# Patient Record
Sex: Female | Born: 1980 | Race: Black or African American | Hispanic: No | Marital: Single | Smoking: Never smoker
Health system: Southern US, Community
[De-identification: ages and names within clinical notes are randomized; demographics above are authoritative.]

## PROBLEM LIST (undated history)

## (undated) DIAGNOSIS — E282 Polycystic ovarian syndrome: Secondary | ICD-10-CM

## (undated) DIAGNOSIS — N814 Uterovaginal prolapse, unspecified: Secondary | ICD-10-CM

## (undated) DIAGNOSIS — L732 Hidradenitis suppurativa: Secondary | ICD-10-CM

## (undated) DIAGNOSIS — E559 Vitamin D deficiency, unspecified: Secondary | ICD-10-CM

## (undated) DIAGNOSIS — I10 Essential (primary) hypertension: Secondary | ICD-10-CM

## (undated) DIAGNOSIS — F419 Anxiety disorder, unspecified: Secondary | ICD-10-CM

## (undated) DIAGNOSIS — M549 Dorsalgia, unspecified: Secondary | ICD-10-CM

## (undated) DIAGNOSIS — D649 Anemia, unspecified: Secondary | ICD-10-CM

## (undated) DIAGNOSIS — E611 Iron deficiency: Secondary | ICD-10-CM

## (undated) DIAGNOSIS — R079 Chest pain, unspecified: Secondary | ICD-10-CM

## (undated) DIAGNOSIS — M2550 Pain in unspecified joint: Secondary | ICD-10-CM

## (undated) DIAGNOSIS — G4730 Sleep apnea, unspecified: Secondary | ICD-10-CM

## (undated) DIAGNOSIS — Z973 Presence of spectacles and contact lenses: Secondary | ICD-10-CM

## (undated) DIAGNOSIS — E119 Type 2 diabetes mellitus without complications: Secondary | ICD-10-CM

## (undated) DIAGNOSIS — D219 Benign neoplasm of connective and other soft tissue, unspecified: Secondary | ICD-10-CM

## (undated) DIAGNOSIS — M1990 Unspecified osteoarthritis, unspecified site: Secondary | ICD-10-CM

## (undated) HISTORY — DX: Anxiety disorder, unspecified: F41.9

## (undated) HISTORY — DX: Essential (primary) hypertension: I10

## (undated) HISTORY — DX: Hidradenitis suppurativa: L73.2

## (undated) HISTORY — DX: Anemia, unspecified: D64.9

## (undated) HISTORY — DX: Type 2 diabetes mellitus without complications: E11.9

## (undated) HISTORY — DX: Chest pain, unspecified: R07.9

## (undated) HISTORY — DX: Vitamin D deficiency, unspecified: E55.9

## (undated) HISTORY — DX: Iron deficiency: E61.1

## (undated) HISTORY — DX: Benign neoplasm of connective and other soft tissue, unspecified: D21.9

## (undated) HISTORY — PX: BREAST BIOPSY: SHX20

## (undated) HISTORY — DX: Uterovaginal prolapse, unspecified: N81.4

## (undated) HISTORY — DX: Pain in unspecified joint: M25.50

## (undated) HISTORY — DX: Dorsalgia, unspecified: M54.9

## (undated) HISTORY — DX: Polycystic ovarian syndrome: E28.2

---
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
## 2005-02-23 HISTORY — PX: OTHER SURGICAL HISTORY: SHX169

## 2005-04-25 DIAGNOSIS — E119 Type 2 diabetes mellitus without complications: Secondary | ICD-10-CM

## 2005-04-25 HISTORY — DX: Type 2 diabetes mellitus without complications: E11.9

## 2008-04-25 DIAGNOSIS — N814 Uterovaginal prolapse, unspecified: Secondary | ICD-10-CM

## 2008-04-25 DIAGNOSIS — D219 Benign neoplasm of connective and other soft tissue, unspecified: Secondary | ICD-10-CM

## 2008-04-25 HISTORY — DX: Uterovaginal prolapse, unspecified: N81.4

## 2008-04-25 HISTORY — DX: Benign neoplasm of connective and other soft tissue, unspecified: D21.9

## 2008-10-06 DIAGNOSIS — L732 Hidradenitis suppurativa: Secondary | ICD-10-CM | POA: Insufficient documentation

## 2009-04-25 HISTORY — PX: HYSTEROSCOPY: SHX211

## 2009-04-25 HISTORY — PX: DILATION AND CURETTAGE OF UTERUS: SHX78

## 2012-11-12 ENCOUNTER — Encounter: Payer: Self-pay | Admitting: Nurse Practitioner

## 2012-11-12 ENCOUNTER — Ambulatory Visit (INDEPENDENT_AMBULATORY_CARE_PROVIDER_SITE_OTHER): Payer: 59 | Admitting: Nurse Practitioner

## 2012-11-12 VITALS — BP 132/70 | HR 64 | Resp 14 | Ht 65.5 in | Wt 265.4 lb

## 2012-11-12 DIAGNOSIS — E119 Type 2 diabetes mellitus without complications: Secondary | ICD-10-CM

## 2012-11-12 DIAGNOSIS — Z113 Encounter for screening for infections with a predominantly sexual mode of transmission: Secondary | ICD-10-CM

## 2012-11-12 DIAGNOSIS — L732 Hidradenitis suppurativa: Secondary | ICD-10-CM

## 2012-11-12 DIAGNOSIS — Z0000 Encounter for general adult medical examination without abnormal findings: Secondary | ICD-10-CM

## 2012-11-12 DIAGNOSIS — Z01419 Encounter for gynecological examination (general) (routine) without abnormal findings: Secondary | ICD-10-CM

## 2012-11-12 LAB — POCT URINALYSIS DIPSTICK: Urobilinogen, UA: NEGATIVE

## 2012-11-12 LAB — HEMOGLOBIN, FINGERSTICK: Hemoglobin, fingerstick: 12.3 g/dL (ref 12.0–16.0)

## 2012-11-12 MED ORDER — CEPHALEXIN 500 MG PO CAPS: 500.0000 mg | ORAL_CAPSULE | Freq: Two times a day (BID) | ORAL | Status: DC

## 2012-11-12 NOTE — Patient Instructions (Addendum)

## 2012-11-12 NOTE — Progress Notes (Signed)
31 y.o.G0. Single African American Fe here for annual exam.  Menses is regular and now at only 4 days.  When fibroids were present cycle for 7- 10 days and very heavy. Not dating but sexually same partner for 8 years on and off. Nursing Diplomatic Services operational officer at [employer]   Patient's last menstrual period was 10/05/2012.          Sexually active: yes  The current method of family planning is none.    Exercising: yes  zumba and weight lifting.  Smoker:  no  Health Maintenance: Pap:  2012 normal, abnormal pap 2010 with repeat at normal no procedures. MMG:  never TDaP:  2009 Labs: Hgb- 13.2   reports that she has never smoked. She has never used smokeless tobacco. She reports that she does not drink alcohol or use illicit drugs.  Past Medical History  Diagnosis Date  . Prolapsed uterus   . Fibroids   . Hypertension   . Anemia   . Diabetes mellitus without complication     Past Surgical History  Procedure Laterality Date  . Dilation and curettage of uterus      fibroids  . Carbuckle cyst in back removed    . Hysteroscopy      Current Outpatient Prescriptions  Medication Sig Dispense Refill  . ferrous fumarate (HEMOCYTE - 106 MG FE) 325 (106 FE) MG TABS Take 1 tablet by mouth.      Marland Kitchen lisinopril (PRINIVIL,ZESTRIL) 20 MG tablet Take 20 mg by mouth daily.      . Prenatal Vit-Fe Fumarate-FA (PRENATAL MULTIVITAMIN) TABS Take 1 tablet by mouth daily at 12 noon.       No current facility-administered medications for this visit.    Family History  Problem Relation Age of Onset  . Hypertension Mother   . Diabetes Mother   . Hypertension Father   . Stroke Maternal Aunt   . Diabetes Maternal Aunt   . Hypertension Maternal Aunt     ROS:  Pertinent items are noted in HPI.  Otherwise, a comprehensive ROS was negative.  Exam:   BP 132/70  Pulse 64  Resp 14  Ht 5' 5.5" (1.664 m)  Wt 265 lb 6.4 oz (120.385 kg)  BMI 43.48 kg/m2  LMP 10/05/2012 Height: 5' 5.5" (166.4 cm)  Ht Readings from  Last 3 Encounters:  11/12/12 5' 5.5" (1.664 m)    General appearance: alert, cooperative and appears stated age Head: Normocephalic, without obvious abnormality, atraumatic Neck: no adenopathy, supple, symmetrical, trachea midline and thyroid normal to inspection and palpation Lungs: clear to auscultation bilaterally Breasts: normal appearance, no masses or tenderness left axilla with a small area 1 cm of flare of hidradenitis and a much smaller area under left breast about 1/2 cm size. No exudate from either site. Heart: regular rate and rhythm Abdomen: soft, non-tender; no masses,  no organomegaly Extremities: extremities normal, atraumatic, no cyanosis or edema Skin: Skin color, texture, turgor normal. No rashes or lesions Lymph nodes: Cervical, supraclavicular, and axillary nodes normal. No abnormal inguinal nodes palpated Neurologic: Grossly normal   Pelvic: External genitalia:  no lesions              Urethra:  normal appearing urethra with no masses, tenderness or lesions              Bartholin's and Skene's: normal                 Vagina: normal appearing vagina with normal color and discharge,  no lesions              Cervix: anteverted and is prominent with some prolapse.              Pap taken: yes Bimanual Exam:  Uterus:  normal size, contour, position, consistency, mobility, non-tender              Adnexa: no mass, fullness, tenderness               Rectovaginal: Confirms               Anus:  normal sphincter tone, no lesions  A:  Well Woman with normal exam  Condoms for contraception  History of uterine fibroids and menorrhagia with anemia - Hysteroscopic removal 2011  Hidradenitis of the axilla wit current  flare  R/O STD's  History of Diabetes - now diet controlled  History of Hypertension  Strong FMH of Diabetes  P:   Pap smear as per guidelines   Keflex 500 mg BID for next 5-7 days then hold rest for prn use # 30/ 0  Will make referral to Dr. Talmage Nap to  establish care since endocrinologist is in [place], [place]  OTC Phisoderm 2-3 times a week to decrease incidence of hidradenitis  counseled on breast self exam, STD prevention, adequate intake of calcium and vitamin D, diet and exercise.  If heavier menses to call back. return annually or prn  An After Visit Summary was printed and given to the patient.

## 2012-11-14 LAB — IPS PAP TEST WITH HPV

## 2012-11-14 LAB — IPS N GONORRHOEA AND CHLAMYDIA BY PCR

## 2012-11-14 NOTE — Progress Notes (Signed)
Encounter reviewed by Dr. Tifanie Gardiner Silva.  

## 2012-11-19 ENCOUNTER — Telehealth: Payer: Self-pay | Admitting: *Deleted

## 2012-11-19 NOTE — Telephone Encounter (Signed)
LVM for pt to return my call in regards to std results/needing new pap.

## 2012-11-19 NOTE — Telephone Encounter (Signed)
Returning call.

## 2012-11-19 NOTE — Telephone Encounter (Signed)
Message copied by Osie Bond on Mon Nov 19, 2012  2:00 PM ------      Message from: Ria Comment R      Created: Thu Nov 15, 2012  8:40 AM       Ask patient to return for repeat pap she had too few cells to test and I want to make sure pap is OK.  Her GC/ Chl and other STD's was negative. ------

## 2012-11-26 ENCOUNTER — Ambulatory Visit (INDEPENDENT_AMBULATORY_CARE_PROVIDER_SITE_OTHER): Payer: 59 | Admitting: Nurse Practitioner

## 2012-11-26 ENCOUNTER — Encounter: Payer: Self-pay | Admitting: Nurse Practitioner

## 2012-11-26 VITALS — BP 140/78 | HR 60 | Resp 25 | Ht 63.75 in | Wt 274.4 lb

## 2012-11-26 DIAGNOSIS — Z0000 Encounter for general adult medical examination without abnormal findings: Secondary | ICD-10-CM

## 2012-11-26 NOTE — Progress Notes (Signed)
Subjective:     Patient ID: Cathy Ho, female   DOB: [birth year], 32 y.o.   MRN: [id number]  HPI 32 yo SAA Fe presents to have a repeat pap.  Last pap at [facility] 7/21 revealed too few cells to get adequate pap.  Her GC / Chl was done and reported as negative bu patient wants this repeated as well.  She is concerned that test may have insufficient cells. No changes in history or problems since last here.   Review of Systems  Constitutional: Negative.   Respiratory: Negative.   Cardiovascular: Negative.   Gastrointestinal: Negative.   Genitourinary: Negative.   Musculoskeletal: Negative.   Skin: Negative.   Psychiatric/Behavioral: Negative.        Objective:   Physical Exam  Constitutional: She appears well-developed and well-nourished.  Genitourinary:  Cervix easily visualized and pap again collected along with GC & CHL.  No vaginal discharge.       Assessment:     Repeat pap secondary to insufficient cells Per patients request will also repeat some of the STD's    Plan:     Call patient with test results RTO in 1 year.

## 2012-11-26 NOTE — Patient Instructions (Signed)
Will call you with results

## 2012-11-27 LAB — IPS N GONORRHOEA AND CHLAMYDIA BY PCR

## 2012-11-28 NOTE — Progress Notes (Signed)
Encounter reviewed by Dr. Brook Silva.  

## 2013-01-19 ENCOUNTER — Encounter: Payer: Self-pay | Admitting: Nurse Practitioner

## 2013-01-25 ENCOUNTER — Institutional Professional Consult (permissible substitution): Payer: 59 | Admitting: Nurse Practitioner

## 2013-01-28 ENCOUNTER — Institutional Professional Consult (permissible substitution): Payer: 59 | Admitting: Nurse Practitioner

## 2013-01-30 ENCOUNTER — Institutional Professional Consult (permissible substitution): Payer: 59 | Admitting: Nurse Practitioner

## 2013-02-05 ENCOUNTER — Ambulatory Visit (INDEPENDENT_AMBULATORY_CARE_PROVIDER_SITE_OTHER): Payer: 59 | Admitting: Nurse Practitioner

## 2013-02-05 ENCOUNTER — Encounter: Payer: Self-pay | Admitting: Nurse Practitioner

## 2013-02-05 VITALS — BP 140/92 | HR 68 | Ht 65.5 in | Wt 272.0 lb

## 2013-02-05 DIAGNOSIS — N926 Irregular menstruation, unspecified: Secondary | ICD-10-CM

## 2013-02-05 MED ORDER — MEDROXYPROGESTERONE ACETATE 10 MG PO TABS: 10.0000 mg | ORAL_TABLET | Freq: Every day | ORAL | Status: DC

## 2013-02-05 NOTE — Progress Notes (Signed)
Subjective:     Patient ID: Cathy Ho, female   DOB: 05/18/1980, 32 y.o.   MRN: [id number]  HPI  This 32 yo SAA Fe presents with history of irregular menses.  She is usually 24 - 35 days apart.  Cycles when they do occur are heavy and some cramps.  She is quite uncomfortable and bloated for several days.  Her LMP was 9/27 and lasted 4 days.  She is going out of town on a trip Oct 30 for a week and is concerned that her cycle will occur during travel and while gone.  She wants to start her cycle earlier to avoid a late cycle. Not sexually active since last year.    Review of Systems  HENT: Negative.   Respiratory: Negative.   Cardiovascular: Negative.   Gastrointestinal: Negative.   Genitourinary: Negative.        Irregular menses as noted  Musculoskeletal: Negative.   Skin: Negative.   Neurological: Negative.        Objective:   Physical Exam  Constitutional: She is oriented to person, place, and time. She appears well-developed and well-nourished.  No exam needed at this time.  Neurological: She is alert and oriented to person, place, and time.  Psychiatric: She has a normal mood and affect. Her behavior is normal. Judgment and thought content normal.       Assessment:     History of irregular menses   History of DM, HTN, Not sexually active Plan:     Will start her on Provera 10 for a week in order to produce a withdrawal bleed before her date of travel.  She may also consider POP in the future.  She understands this may not help with cycle change at this time.

## 2013-02-07 NOTE — Progress Notes (Signed)
Encounter reviewed by Dr. Anaeli Cornwall Silva.  

## 2013-02-28 ENCOUNTER — Other Ambulatory Visit: Payer: Self-pay

## 2013-04-17 ENCOUNTER — Encounter: Payer: Self-pay | Admitting: Nurse Practitioner

## 2013-05-30 ENCOUNTER — Encounter: Payer: Self-pay | Admitting: Nurse Practitioner

## 2013-07-02 ENCOUNTER — Encounter: Payer: Self-pay | Admitting: Gastroenterology

## 2013-07-02 ENCOUNTER — Ambulatory Visit (INDEPENDENT_AMBULATORY_CARE_PROVIDER_SITE_OTHER): Payer: 59 | Admitting: Gastroenterology

## 2013-07-02 VITALS — BP 154/90 | HR 76 | Ht 63.5 in | Wt 296.6 lb

## 2013-07-02 DIAGNOSIS — K625 Hemorrhage of anus and rectum: Secondary | ICD-10-CM

## 2013-07-02 MED ORDER — HYDROCORTISONE ACETATE 25 MG RE SUPP: 25.0000 mg | Freq: Every day | RECTAL | Status: DC

## 2013-07-02 MED ORDER — NA SULFATE-K SULFATE-MG SULF 17.5-3.13-1.6 GM/177ML PO SOLN: ORAL | Status: DC

## 2013-07-02 NOTE — Patient Instructions (Signed)
You have been scheduled for a colonoscopy at [facility] with Dr. Deatra Ina. Please follow written instructions given to you at your visit today.  Please pick up your prep kit at the pharmacy within the next 1-3 days. If you use inhalers (even only as needed), please bring them with you on the day of your procedure. Your physician has requested that you go to www.startemmi.com and enter the access code given to you at your visit today. This web site gives a general overview about your procedure. However, you should still follow specific instructions given to you by our office regarding your preparation for the procedure.  We have sent the following medications to your pharmacy for you to pick up at your convenience: Anusol Suppositories, please take one rectally at bedtime

## 2013-07-02 NOTE — Progress Notes (Signed)
07/02/2013 Cathy Ho [id number] 08-26-1980   HISTORY OF PRESENT ILLNESS:  This is a pleasant 33 year old female who was referred to our office today by a PA from Dr. Hoover Brunette office for evaluation of her rectal bleeding.  She states that a few weeks ago when she was constipated, which occurs on occasion but is not usually a huge issue for her. Then this past Saturday she had a bowel movement before work that had a moderate amount of red blood in the toilet bowl. This occurred again twice on Sunday and then once again yesterday before seeing her PCP. Her PCP told her that it was likely nothing serious but referred her here for further evaluation. She states that she has never had any similar episodes in the past. She admits that she was having some abdominal discomfort at the time of her constipation, however, that has resolved. Hemoglobin yesterday at her PCP office was 11.8 grams.  If was very tearful and worried at her visit today.   Past Medical History  Diagnosis Date  . Prolapsed uterus 2010  . Hypertension   . Anemia   . Fibroids 2010  . Diabetes mellitus without complication 7829    diet and exercise only   Past Surgical History  Procedure Laterality Date  . Carbuckle cyst in back removed  02/2005  . Dilation and curettage of uterus  2011    fibroids  . Hysteroscopy  2011  . Wisdom tooth extraction  1998    reports that she has never smoked. She has never used smokeless tobacco. She reports that she drinks alcohol. She reports that she does not use illicit drugs. family history includes Diabetes in her maternal aunt, maternal grandfather, maternal uncle, mother, and paternal grandmother; Heart disease in her maternal grandfather and paternal grandmother; Hypertension in her father, maternal aunt, maternal grandfather, and mother; Seizures in her sister; Stroke in her maternal aunt. There is no history of Colon cancer, Liver disease, or Kidney disease. No Known Allergies     Outpatient Encounter Prescriptions as of 07/02/2013  Medication Sig  . Biotin 5000 MCG TABS Take 1 tablet by mouth daily.  Marland Kitchen docusate sodium (COLACE) 100 MG capsule Take 200 mg by mouth daily.  . ferrous fumarate (HEMOCYTE - 106 MG FE) 325 (106 FE) MG TABS Take 1 tablet by mouth.  Marland Kitchen lisinopril (PRINIVIL,ZESTRIL) 20 MG tablet Take 20 mg by mouth daily.  . Prenatal Vit-Fe Fumarate-FA (PRENATAL MULTIVITAMIN) TABS Take 1 tablet by mouth daily at 12 noon.  . [DISCONTINUED] medroxyPROGESTERone (PROVERA) 10 MG tablet Take 1 tablet (10 mg total) by mouth daily.     REVIEW OF SYSTEMS  : All other systems reviewed and negative except where noted in the History of Present Illness.   PHYSICAL EXAM: BP 154/90  Pulse 76  Ht 5' 3.5" (1.613 m)  Wt 296 lb 9.6 oz (134.537 kg)  BMI 51.71 kg/m2  LMP 06/13/2013 General: Well developed black female in no acute distress; tearful Head: Normocephalic and atraumatic Eyes:  Sclerae anicteric, conjunctiva pink. Ears: Normal auditory acuity. Lungs: Clear throughout to auscultation Heart: Regular rate and rhythm Abdomen: Soft, obese, non-distended.  Normal bowel sounds.  Non-tender. Rectal:  No external hemorrhoids or fissures noted.  Light brown stool on exam glove was heme negative.  Anoscopy revealed small non-bleeding hemorrhoid and a small non-bleeding anal fissure.    Musculoskeletal: Symmetrical with no gross deformities  Skin: No lesions on visible extremities Extremities: No edema  Neurological: Alert oriented x 4, grossly  non-focal Psychological:  Alert and cooperative. Normal mood and affect  ASSESSMENT AND PLAN: -Rectal bleeding:  I performed rectal exam and anoscopy.  She appeared to have a small internal anal fissure, but no apparent bleeding.  Will schedule colonoscopy for patient's reassurance and to rule out any other cause of bleeding.  Will give her anusol suppositories to use at bedtime in the interim.

## 2013-07-03 NOTE — Progress Notes (Signed)
Reviewed and agree with management.  Suspect hemorrhoidal bleeding.  Note anemia. Sandy Salaam. Deatra Ina, M.D., [facility]

## 2013-07-04 ENCOUNTER — Encounter: Payer: Self-pay | Admitting: Obstetrics & Gynecology

## 2013-07-22 ENCOUNTER — Telehealth: Payer: Self-pay

## 2013-07-22 NOTE — Telephone Encounter (Signed)
Pts colon moved to [facility] 08/15/13@12 :30pm. Pt to arrive at the hospital at [place] instructions mailed to pt and she is aware of appt date and time.

## 2013-07-22 NOTE — Telephone Encounter (Signed)
Pts colon at [facility] moved to 08/15/13@12 :30pm. Pt to be there at 11am and updated instructions mailed to pt. Pt aware of appt.

## 2013-07-24 ENCOUNTER — Encounter (HOSPITAL_COMMUNITY): Payer: Self-pay | Admitting: Pharmacy Technician

## 2013-07-25 ENCOUNTER — Encounter (HOSPITAL_COMMUNITY): Payer: Self-pay | Admitting: *Deleted

## 2013-08-15 ENCOUNTER — Encounter (HOSPITAL_COMMUNITY): Payer: Self-pay | Admitting: *Deleted

## 2013-08-15 ENCOUNTER — Ambulatory Visit (HOSPITAL_COMMUNITY): Payer: 59 | Admitting: Anesthesiology

## 2013-08-15 ENCOUNTER — Ambulatory Visit (HOSPITAL_COMMUNITY)
Admission: RE | Admit: 2013-08-15 | Discharge: 2013-08-15 | Disposition: A | Discharge status: Home / Self Care | Payer: 59 | Source: Ambulatory Visit | Attending: Gastroenterology | Admitting: Gastroenterology

## 2013-08-15 ENCOUNTER — Encounter (HOSPITAL_COMMUNITY): Payer: 59 | Admitting: Anesthesiology

## 2013-08-15 ENCOUNTER — Encounter (HOSPITAL_COMMUNITY)
Admission: RE | Disposition: A | Discharge status: Home / Self Care | Payer: Self-pay | Source: Ambulatory Visit | Attending: Gastroenterology

## 2013-08-15 DIAGNOSIS — I10 Essential (primary) hypertension: Secondary | ICD-10-CM | POA: Insufficient documentation

## 2013-08-15 DIAGNOSIS — Z79899 Other long term (current) drug therapy: Secondary | ICD-10-CM | POA: Insufficient documentation

## 2013-08-15 DIAGNOSIS — G4730 Sleep apnea, unspecified: Secondary | ICD-10-CM | POA: Insufficient documentation

## 2013-08-15 DIAGNOSIS — D126 Benign neoplasm of colon, unspecified: Secondary | ICD-10-CM

## 2013-08-15 DIAGNOSIS — K648 Other hemorrhoids: Secondary | ICD-10-CM

## 2013-08-15 DIAGNOSIS — K625 Hemorrhage of anus and rectum: Secondary | ICD-10-CM

## 2013-08-15 DIAGNOSIS — Z6841 Body Mass Index (BMI) 40.0 and over, adult: Secondary | ICD-10-CM | POA: Insufficient documentation

## 2013-08-15 DIAGNOSIS — D649 Anemia, unspecified: Secondary | ICD-10-CM | POA: Insufficient documentation

## 2013-08-15 DIAGNOSIS — K621 Rectal polyp: Secondary | ICD-10-CM

## 2013-08-15 DIAGNOSIS — E119 Type 2 diabetes mellitus without complications: Secondary | ICD-10-CM | POA: Insufficient documentation

## 2013-08-15 DIAGNOSIS — K620 Anal polyp: Secondary | ICD-10-CM | POA: Insufficient documentation

## 2013-08-15 HISTORY — DX: Sleep apnea, unspecified: G47.30

## 2013-08-15 HISTORY — PX: COLONOSCOPY: SHX5424

## 2013-08-15 LAB — PREGNANCY, URINE: Preg Test, Ur: NEGATIVE

## 2013-08-15 LAB — GLUCOSE, CAPILLARY: Glucose-Capillary: 99 mg/dL (ref 70–99)

## 2013-08-15 SURGERY — COLONOSCOPY
Anesthesia: Monitor Anesthesia Care

## 2013-08-15 MED ORDER — LIDOCAINE HCL 1 % IJ SOLN
INTRAMUSCULAR | Status: AC
  Filled 2013-08-15: qty 20

## 2013-08-15 MED ORDER — PROPOFOL 10 MG/ML IV BOLUS
INTRAVENOUS | Status: AC
  Filled 2013-08-15: qty 20

## 2013-08-15 MED ORDER — SODIUM CHLORIDE 0.9 % IV SOLN: INTRAVENOUS | Status: DC

## 2013-08-15 MED ORDER — LACTATED RINGERS IV SOLN
INTRAVENOUS | Status: DC
  Administered 2013-08-15: 13:00:00 via INTRAVENOUS

## 2013-08-15 MED ORDER — PROPOFOL INFUSION 10 MG/ML OPTIME
INTRAVENOUS | Status: DC | PRN
  Administered 2013-08-15: 300 ug/kg/min via INTRAVENOUS

## 2013-08-15 NOTE — Anesthesia Preprocedure Evaluation (Addendum)
Anesthesia Evaluation  Patient identified by MRN, date of birth, ID band Patient awake    Reviewed: Allergy & Precautions, H&P , NPO status , Patient's Chart, lab work & pertinent test results  Airway Mallampati: II TM Distance: >3 FB Neck ROM: Full    Dental no notable dental hx.    Pulmonary sleep apnea and Continuous Positive Airway Pressure Ventilation ,  breath sounds clear to auscultation  Pulmonary exam normal       Cardiovascular Exercise Tolerance: Good hypertension, Pt. on medications negative cardio ROS  Rhythm:Regular Rate:Normal     Neuro/Psych negative neurological ROS  negative psych ROS   GI/Hepatic negative GI ROS, Neg liver ROS,   Endo/Other  diabetes, Type 2Morbid obesity  Renal/GU negative Renal ROS  negative genitourinary   Musculoskeletal negative musculoskeletal ROS (+)   Abdominal (+) + obese,   Peds negative pediatric ROS (+)  Hematology  (+) anemia ,   Anesthesia Other Findings   Reproductive/Obstetrics negative OB ROS                          Anesthesia Physical Anesthesia Plan  ASA: III  Anesthesia Plan: MAC   Post-op Pain Management:    Induction: Intravenous  Airway Management Planned:   Additional Equipment:   Intra-op Plan:   Post-operative Plan:   Informed Consent: I have reviewed the patients History and Physical, chart, labs and discussed the procedure including the risks, benefits and alternatives for the proposed anesthesia with the patient or authorized representative who has indicated his/her understanding and acceptance.   Dental advisory given  Plan Discussed with: CRNA  Anesthesia Plan Comments:        Anesthesia Quick Evaluation

## 2013-08-15 NOTE — Anesthesia Postprocedure Evaluation (Signed)
  Anesthesia Post-op Note  Patient: Cathy Ho  Procedure(s) Performed: Procedure(s) (LRB): COLONOSCOPY (N/A)  Patient Location: PACU  Anesthesia Type: MAC  Level of Consciousness: awake and alert   Airway and Oxygen Therapy: Patient Spontanous Breathing  Post-op Pain: mild  Post-op Assessment: Post-op Vital signs reviewed, Patient's Cardiovascular Status Stable, Respiratory Function Stable, Patent Airway and No signs of Nausea or vomiting  Last Vitals:  Filed Vitals:   08/15/13 1420  BP: 159/107  Pulse:   Temp:   Resp: 12    Post-op Vital Signs: stable   Complications: No apparent anesthesia complications

## 2013-08-15 NOTE — Transfer of Care (Signed)
Immediate Anesthesia Transfer of Care Note  Patient: Cathy Ho  Procedure(s) Performed: Procedure(s): COLONOSCOPY (N/A)  Patient Location: PACU and Endoscopy Unit  Anesthesia Type:MAC  Level of Consciousness: awake, alert , oriented and patient cooperative  Airway & Oxygen Therapy: Patient Spontanous Breathing and Patient connected to face mask oxygen  Post-op Assessment: Report given to PACU RN and Post -op Vital signs reviewed and stable  Post vital signs: Reviewed and stable  Complications: No apparent anesthesia complications

## 2013-08-15 NOTE — H&P (Signed)
HISTORY OF PRESENT ILLNESS: This is a pleasant 33 year old female who was referred to our office today by a PA from Dr. Hoover Brunette office for evaluation of her rectal bleeding. She states that a few weeks ago when she was constipated, which occurs on occasion but is not usually a huge issue for her. Then this past Saturday she had a bowel movement before work that had a moderate amount of red blood in the toilet bowl. This occurred again twice on Sunday and then once again yesterday before seeing her PCP. Her PCP told her that it was likely nothing serious but referred her here for further evaluation. She states that she has never had any similar episodes in the past. She admits that she was having some abdominal discomfort at the time of her constipation, however, that has resolved. Hemoglobin yesterday at her PCP office was 11.8 grams. If was very tearful and worried at her visit today.  Past Medical History   Diagnosis  Date   .  Prolapsed uterus  2010   .  Hypertension    .  Anemia    .  Fibroids  2010   .  Diabetes mellitus without complication  1914     diet and exercise only    Past Surgical History   Procedure  Laterality  Date   .  Carbuckle cyst in back removed   02/2005   .  Dilation and curettage of uterus   2011     fibroids   .  Hysteroscopy   2011   .  Wisdom tooth extraction   1998   reports that she has never smoked. She has never used smokeless tobacco. She reports that she drinks alcohol. She reports that she does not use illicit drugs.  family history includes Diabetes in her maternal aunt, maternal grandfather, maternal uncle, mother, and paternal grandmother; Heart disease in her maternal grandfather and paternal grandmother; Hypertension in her father, maternal aunt, maternal grandfather, and mother; Seizures in her sister; Stroke in her maternal aunt. There is no history of Colon cancer, Liver disease, or Kidney disease.  No Known Allergies  Outpatient Encounter  Prescriptions as of 07/02/2013   Medication  Sig   .  Biotin 5000 MCG TABS  Take 1 tablet by mouth daily.   Marland Kitchen  docusate sodium (COLACE) 100 MG capsule  Take 200 mg by mouth daily.   .  ferrous fumarate (HEMOCYTE - 106 MG FE) 325 (106 FE) MG TABS  Take 1 tablet by mouth.   Marland Kitchen  lisinopril (PRINIVIL,ZESTRIL) 20 MG tablet  Take 20 mg by mouth daily.   .  Prenatal Vit-Fe Fumarate-FA (PRENATAL MULTIVITAMIN) TABS  Take 1 tablet by mouth daily at 12 noon.   .  [DISCONTINUED] medroxyPROGESTERone (PROVERA) 10 MG tablet  Take 1 tablet (10 mg total) by mouth daily.   REVIEW OF SYSTEMS : All other systems reviewed and negative except where noted in the History of Present Illness.  PHYSICAL EXAM:  BP 154/90  Pulse 76  Ht 5' 3.5" (1.613 m)  Wt 296 lb 9.6 oz (134.537 kg)  BMI 51.71 kg/m2  LMP 06/13/2013  General: Well developed black female in no acute distress; tearful  Head: Normocephalic and atraumatic  Eyes: Sclerae anicteric, conjunctiva pink.  Ears: Normal auditory acuity.  Lungs: Clear throughout to auscultation  Heart: Regular rate and rhythm  Abdomen: Soft, obese, non-distended. Normal bowel sounds. Non-tender.  Rectal: No external hemorrhoids or fissures noted. Light brown stool on exam glove  was heme negative. Anoscopy revealed small non-bleeding hemorrhoid and a small non-bleeding anal fissure.  Musculoskeletal: Symmetrical with no gross deformities  Skin: No lesions on visible extremities  Extremities: No edema  Neurological: Alert oriented x 4, grossly non-focal  Psychological: Alert and cooperative. Normal mood and affect  ASSESSMENT AND PLAN:  -Rectal bleeding: I performed rectal exam and anoscopy. She appeared to have a small internal anal fissure, but no apparent bleeding. Will schedule colonoscopy for patient's reassurance and to rule out any other cause of bleeding. Will give her anusol suppositories to use at bedtime in the interim.

## 2013-08-15 NOTE — Op Note (Signed)
[facility] [place], [address]   COLONOSCOPY PROCEDURE REPORT  PATIENT: Cathy Ho, Cathy Ho  MR#: [id number] BIRTHDATE: [birth year] , 32  yrs. old GENDER: Female ENDOSCOPIST: Inda Castle, MD REFERRED BY: PROCEDURE DATE:  08/15/2013 PROCEDURE:   Colonoscopy with snare polypectomy First Screening Colonoscopy - Avg.  risk and is 50 yrs.  old or older Yes.  Prior Negative Screening - Now for repeat screening. N/A  History of Adenoma - Now for follow-up colonoscopy & has been > or = to 3 yrs.  N/A  Polyps Removed Today? Yes. ASA CLASS:   Class II INDICATIONS:Rectal Bleeding. MEDICATIONS: MAC sedation, administered by CRNA  DESCRIPTION OF PROCEDURE:   After the risks benefits and alternatives of the procedure were thoroughly explained, informed consent was obtained.  A digital rectal exam revealed no abnormalities of the rectum.   The Pentax Colonoscope [id number] endoscope was introduced through the anus and advanced to the cecum, which was identified by both the appendix and ileocecal valve. No adverse events experienced.   The quality of the prep was Suprep good  The instrument was then slowly withdrawn as the colon was fully examined.      COLON FINDINGS: A sessile polyp measuring 2-3 mm in size was found in the rectum.  Nonbleeding.  A polypectomy was performed with a cold snare.  The resection was complete and the polyp tissue was completely retrieved.   Internal hemorrhoids were found.   The colon was otherwise normal.  There was no diverticulosis, inflammation, polyps or cancers unless previously stated. Retroflexed views revealed no abnormalities. The time to cecum=3 minutes 0 seconds.  Withdrawal time=9 minutes 0 seconds.  The scope was withdrawn and the procedure completed. COMPLICATIONS: There were no complications.  ENDOSCOPIC IMPRESSION: 1.   Sessile polyp measuring 2-3 mm in size was found in the rectum; polypectomy was performed  with a cold snare 2.   Internal hemorrhoids 3.   The colon was otherwise normal  limited rectal bleeding secondary to hemorrhoids   RECOMMENDATIONS: If the polyp(s) removed today are proven to be adenomatous (pre-cancerous) polyps, you will need a repeat colonoscopy in 5 years.  Otherwise you should continue to follow colorectal cancer screening guidelines for "routine risk" patients with colonoscopy in 10 years.  You will receive a letter within 1-2 weeks with the results of your biopsy as well as final recommendations.  Please call my office if you have not received a letter after 3 weeks.   eSigned:  Inda Castle, MD 08/15/2013 1:47 PM   cc:   PATIENT NAME:  Cathy Ho, Cathy Ho MR#: [id number]

## 2013-08-16 ENCOUNTER — Encounter (HOSPITAL_COMMUNITY): Payer: Self-pay | Admitting: Gastroenterology

## 2013-08-19 ENCOUNTER — Encounter: Payer: Self-pay | Admitting: Gastroenterology

## 2013-11-14 ENCOUNTER — Ambulatory Visit: Payer: 59 | Admitting: Nurse Practitioner

## 2013-12-31 ENCOUNTER — Ambulatory Visit: Payer: 59 | Admitting: Nurse Practitioner

## 2014-07-24 ENCOUNTER — Other Ambulatory Visit: Payer: Self-pay | Admitting: Internal Medicine

## 2014-07-24 DIAGNOSIS — N6311 Unspecified lump in the right breast, upper outer quadrant: Secondary | ICD-10-CM

## 2014-07-28 ENCOUNTER — Ambulatory Visit
Admission: RE | Admit: 2014-07-28 | Discharge: 2014-07-28 | Disposition: A | Discharge status: Home / Self Care | Payer: 59 | Source: Ambulatory Visit | Attending: Internal Medicine | Admitting: Internal Medicine

## 2014-07-28 DIAGNOSIS — N6311 Unspecified lump in the right breast, upper outer quadrant: Secondary | ICD-10-CM

## 2015-02-16 ENCOUNTER — Other Ambulatory Visit: Payer: Self-pay | Admitting: Internal Medicine

## 2015-02-16 DIAGNOSIS — N6310 Unspecified lump in the right breast, unspecified quadrant: Secondary | ICD-10-CM

## 2015-02-20 ENCOUNTER — Ambulatory Visit
Admission: RE | Admit: 2015-02-20 | Discharge: 2015-02-20 | Disposition: A | Discharge status: Home / Self Care | Payer: 59 | Source: Ambulatory Visit | Attending: Internal Medicine | Admitting: Internal Medicine

## 2015-02-20 DIAGNOSIS — N6310 Unspecified lump in the right breast, unspecified quadrant: Secondary | ICD-10-CM

## 2015-05-01 DIAGNOSIS — Z0100 Encounter for examination of eyes and vision without abnormal findings: Secondary | ICD-10-CM | POA: Diagnosis not present

## 2015-07-08 DIAGNOSIS — N925 Other specified irregular menstruation: Secondary | ICD-10-CM | POA: Diagnosis not present

## 2017-09-01 DIAGNOSIS — E1165 Type 2 diabetes mellitus with hyperglycemia: Secondary | ICD-10-CM | POA: Insufficient documentation

## 2017-09-01 DIAGNOSIS — E1159 Type 2 diabetes mellitus with other circulatory complications: Secondary | ICD-10-CM | POA: Insufficient documentation

## 2017-09-01 DIAGNOSIS — L0211 Cutaneous abscess of neck: Secondary | ICD-10-CM | POA: Insufficient documentation

## 2019-04-01 ENCOUNTER — Encounter: Payer: 59 | Admitting: Family Medicine

## 2019-04-01 ENCOUNTER — Ambulatory Visit: Payer: 59 | Admitting: Family Medicine

## 2019-04-01 ENCOUNTER — Other Ambulatory Visit: Payer: Self-pay

## 2019-04-01 DIAGNOSIS — Z91199 Patient's noncompliance with other medical treatment and regimen due to unspecified reason: Secondary | ICD-10-CM

## 2019-04-01 DIAGNOSIS — Z5329 Procedure and treatment not carried out because of patient's decision for other reasons: Secondary | ICD-10-CM

## 2019-04-01 NOTE — Progress Notes (Signed)
During check-in process pt asked if this office will be able to provide primary care. I explained to pt that we are unable to accept patients for primary care management. Patient denies any gyn needs at this time. Pt states she was seen by prior gyn in August and will reschedule with our office for annual visit in 2021 or when the need arises.   Pt states that she would not like to see a provider today as she does not have any needs. Discussed PCP options with pt; offered to place ambulatory referral. Pt is unsure which PCP she would like to use at this time and would not like a referral.   Lanette Hampshire 04/01/19

## 2019-04-01 NOTE — Progress Notes (Signed)
See RN note. Patient wanted to establish care for [place] to be PCP. Up-to-date on Pap and no gyn concerns. Opted to leave and find PCP and come back for annual gyn visits.  Barrington Ellison, MD [facility] Family Medicine Fellow, [facility] for [employer], [place]

## 2019-09-02 ENCOUNTER — Other Ambulatory Visit: Payer: Self-pay | Admitting: Emergency Medicine

## 2019-09-02 ENCOUNTER — Other Ambulatory Visit: Payer: Self-pay | Admitting: Family Medicine

## 2019-09-02 DIAGNOSIS — N6459 Other signs and symptoms in breast: Secondary | ICD-10-CM

## 2019-09-02 DIAGNOSIS — R234 Changes in skin texture: Secondary | ICD-10-CM

## 2019-09-02 DIAGNOSIS — L299 Pruritus, unspecified: Secondary | ICD-10-CM

## 2019-10-14 ENCOUNTER — Encounter (INDEPENDENT_AMBULATORY_CARE_PROVIDER_SITE_OTHER): Payer: Self-pay | Admitting: Bariatrics

## 2019-10-14 ENCOUNTER — Other Ambulatory Visit: Payer: Self-pay

## 2019-10-14 ENCOUNTER — Ambulatory Visit (INDEPENDENT_AMBULATORY_CARE_PROVIDER_SITE_OTHER): Payer: No Typology Code available for payment source | Admitting: Bariatrics

## 2019-10-14 VITALS — BP 120/79 | HR 90 | Temp 98.4°F | Ht 65.0 in | Wt 284.0 lb

## 2019-10-14 DIAGNOSIS — Z9189 Other specified personal risk factors, not elsewhere classified: Secondary | ICD-10-CM

## 2019-10-14 DIAGNOSIS — Z1331 Encounter for screening for depression: Secondary | ICD-10-CM | POA: Diagnosis not present

## 2019-10-14 DIAGNOSIS — B3731 Acute candidiasis of vulva and vagina: Secondary | ICD-10-CM | POA: Insufficient documentation

## 2019-10-14 DIAGNOSIS — I152 Hypertension secondary to endocrine disorders: Secondary | ICD-10-CM

## 2019-10-14 DIAGNOSIS — R5383 Other fatigue: Secondary | ICD-10-CM

## 2019-10-14 DIAGNOSIS — E66813 Obesity, class 3: Secondary | ICD-10-CM

## 2019-10-14 DIAGNOSIS — D508 Other iron deficiency anemias: Secondary | ICD-10-CM

## 2019-10-14 DIAGNOSIS — R0602 Shortness of breath: Secondary | ICD-10-CM

## 2019-10-14 DIAGNOSIS — I10 Essential (primary) hypertension: Secondary | ICD-10-CM

## 2019-10-14 DIAGNOSIS — E1159 Type 2 diabetes mellitus with other circulatory complications: Secondary | ICD-10-CM | POA: Diagnosis not present

## 2019-10-14 DIAGNOSIS — G4733 Obstructive sleep apnea (adult) (pediatric): Secondary | ICD-10-CM

## 2019-10-14 DIAGNOSIS — Z0289 Encounter for other administrative examinations: Secondary | ICD-10-CM

## 2019-10-14 DIAGNOSIS — E559 Vitamin D deficiency, unspecified: Secondary | ICD-10-CM

## 2019-10-14 DIAGNOSIS — D649 Anemia, unspecified: Secondary | ICD-10-CM | POA: Insufficient documentation

## 2019-10-14 DIAGNOSIS — E118 Type 2 diabetes mellitus with unspecified complications: Secondary | ICD-10-CM

## 2019-10-14 DIAGNOSIS — Z6841 Body Mass Index (BMI) 40.0 and over, adult: Secondary | ICD-10-CM

## 2019-10-14 NOTE — Progress Notes (Signed)
Dear Dr. Janie Ho,   Thank you for referring Cathy Ho to our clinic. The following note includes my evaluation and treatment recommendations.  Chief Complaint:   OBESITY Cathy Ho (MR# [id number]) is a 39 y.o. female who presents for evaluation and treatment of obesity and related comorbidities. Current BMI is Body mass index is 47.26 kg/m.Marland Kitchen Cathy Ho has been struggling with her weight for many years and has been unsuccessful in either losing weight, maintaining weight loss, or reaching her healthy weight goal.  Cathy Ho is currently in the action stage of change and ready to dedicate time achieving and maintaining a healthier weight. Cathy Ho is interested in becoming our patient and working on intensive lifestyle modifications including (but not limited to) diet and exercise for weight loss.  Cathy Ho likes to [employer]. She is not a "picky eater." She reports skipping breakfast and lunch.  Cathy Ho's habits were reviewed today and are as follows: her desired weight loss is 109 lbs, she has been heavy most of her life, she started gaining weight in her mid-20's, her heaviest weight ever was 320 pounds, she craves sweets, carbs, and fried foods, she snacks sometimes in the evenings, she skips breakfast or lunch or both at least 5 times per week, she is frequently drinking liquids with calories, she frequently makes poor food choices, she frequently eats larger portions than normal, she has binge eating behaviors and she struggles with emotional eating.  Depression Screen Cathy Ho's Food and Mood (modified PHQ-9) score was 13.  Depression screen PHQ 2/9 10/14/2019  Decreased Interest 1  Down, Depressed, Hopeless 2  PHQ - 2 Score 3  Altered sleeping 2  Tired, decreased energy 3  Change in appetite 2  Feeling bad or failure about yourself  1  Trouble concentrating 2  Moving slowly or fidgety/restless 0  Suicidal thoughts 0  PHQ-9 Score 13  Difficult doing work/chores Somewhat  difficult   Subjective:   Other fatigue. Cathy Ho denies daytime somnolence and denies waking up still tired. Cathy Ho generally gets 6 hours of sleep per night, and states that she has generally restful sleep. Snoring is present. Apneic episodes are not present. Epworth Sleepiness Score is 9.  SOB (shortness of breath). Cathy Ho notes increasing shortness of breath with certain exercises and seems to be worsening over time with weight gain. She notes getting out of breath sooner with activity than she used to. This has gotten worse recently. Cathy Ho denies shortness of breath at rest or orthopnea.  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 Cathy Ho is taking Xigduo XR and Ozempic.   No results found for: HGBA1C No results found for: GLUF, MICROALBUR, LDLCALC, CREATININE No results found for: INSULIN  Hypertension associated with diabetes ([place]). Cathy Ho is taking lisinopril/HCTZ.  BP Readings from Last 3 Encounters:  10/14/19 120/79  08/15/13 (!) 159/107  07/02/13 (!) 154/90   No results found for: CREATININE  OSA (obstructive sleep apnea). Cathy Ho is using CPAP and reports 6 hours of restful sleep.  Vitamin D deficiency. Cathy Ho is taking Vitamin D supplementation.   Other iron deficiency anemia. Cathy Ho reports having heavy periods. She is not currently on iron supplementation.  CBC Latest Ref Rng & Units 11/12/2012  Hemoglobin 12.0 - 16.0 g/dL 12.3   No results found for: IRON, TIBC, FERRITIN No results found for: VITAMINB12  Depression screening. Cathy Ho had a moderately positive depression screen with a PHQ-9 score of 13.  At risk for activity intolerance. Cathy Ho is at risk of exercise intolerance due to  fatigue and shortness of breath.  Assessment/Plan:   Other fatigue. Cathy Ho does feel that her weight is causing her energy to be lower than it should be. Fatigue may be related to obesity, depression or many other causes. Labs will be ordered, and in the meanwhile, Cathy Ho  will focus on self care including making healthy food choices, increasing physical activity and focusing on stress reduction. EKG 12-Lead, Comprehensive metabolic panel, CBC with Differential/Platelet, Lipid Panel With LDL/HDL Ratio, T3, T4, free, TSH testing ordered today.  SOB (shortness of breath). Cathy Ho does feel that she gets out of breath more easily that she used to when she exercises. Cathy Ho's shortness of breath appears to be obesity related and exercise induced. She has agreed to work on weight loss and gradually increase exercise to treat her exercise induced shortness of breath. Will continue to monitor closely. Lipid Panel With LDL/HDL Ratio ordered today.  Type 2 diabetes with complication ([place]). Good blood sugar control is important to decrease the likelihood of diabetic complications such as nephropathy, neuropathy, limb loss, blindness, coronary artery disease, and death. Intensive lifestyle modification including diet, exercise and weight loss are the first line of treatment for diabetes. Cathy Ho will continue her medications as directed. Hemoglobin A1c, Insulin, random ordered today.  Hypertension associated with diabetes ([place]). Cathy Ho is working on healthy weight loss and exercise to improve blood pressure control. We will watch for signs of hypotension as she continues her lifestyle modifications. She will continue her current medication as directed. CMP will be checked today.  OSA (obstructive sleep apnea). Intensive lifestyle modifications are the first line treatment for this issue. We discussed several lifestyle modifications today and she will continue to work on diet, exercise and weight loss efforts. We will continue to monitor. Orders and follow up as documented in patient record. Cathy Ho will continue to use CPAP as directed.  Counseling  Sleep apnea is a condition in which breathing pauses or becomes shallow during sleep. This happens over and over during the night.  This disrupts your sleep and keeps your body from getting the rest that it needs, which can cause tiredness and lack of energy (fatigue) during the day.  Sleep apnea treatment: If you were given a device to open your airway while you sleep, USE IT!  Sleep hygiene:   Limit or avoid alcohol, caffeinated beverages, and cigarettes, especially close to bedtime.   Do not eat a large meal or eat spicy foods right before bedtime. This can lead to digestive discomfort that can make it hard for you to sleep.  Keep a sleep diary to help you and your health care provider figure out what could be causing your insomnia.  . Make your bedroom a dark, comfortable place where it is easy to fall asleep. ? Put up shades or blackout curtains to block light from outside. ? Use a white noise machine to block noise. ? Keep the temperature cool. . Limit screen use before bedtime. This includes: ? Watching TV. ? Using your smartphone, tablet, or computer. . Stick to a routine that includes going to bed and waking up at the same times every day and night. This can help you fall asleep faster. Consider making a quiet activity, such as reading, part of your nighttime routine. . Try to avoid taking naps during the day so that you sleep better at night. . Get out of bed if you are still awake after 15 minutes of trying to sleep. Keep the lights down, but try reading  or doing a quiet activity. When you feel sleepy, go back to bed.  Vitamin D deficiency. Low Vitamin D level contributes to fatigue and are associated with obesity, breast, and colon cancer. VITAMIN D 25 Hydroxy (Vit-D Deficiency, Fractures) level ordered today.  Other iron deficiency anemia. Orders and follow up as documented in patient record. Will check CBC today.  Counseling . Iron is essential for our bodies to make red blood cells.  Reasons that someone may be deficient include: an iron-deficient diet (more likely in those following vegan or vegetarian  diets), women with heavy menses, patients with GI disorders or poor absorption, patients that have had bariatric surgery, frequent blood donors, patients with cancer, and patients with heart disease.   Marland Kitchen An iron supplement has been recommended. This is found over-the-counter.   Marden Noble foods include dark leafy greens, red and white meats, eggs, seafood, and beans.   . Certain foods and drinks prevent your body from absorbing iron properly. Avoid eating these foods in the same meal as iron-rich foods or with iron supplements. These foods include: coffee, black tea, and red wine; milk, dairy products, and foods that are high in calcium; beans and soybeans; whole grains.  . Constipation can be a side effect of iron supplementation. Increased water and fiber intake are helpful. Water goal: > 2 liters/day. Fiber goal: > 25 grams/day.  Depression screening. Cathy Ho had a positive depression screening. Depression is commonly associated with obesity and often results in emotional eating behaviors. We will monitor this closely and work on CBT to help improve the non-hunger eating patterns. Referral to Psychology may be required if no improvement is seen as she continues in our clinic.  At risk for activity intolerance. Cathy Ho was given approximately 15 minutes of exercise intolerance counseling today. She is 39 y.o. female and has risk factors exercise intolerance including obesity. We discussed intensive lifestyle modifications today with an emphasis on specific weight loss instructions and strategies. Cathy Ho will slowly increase activity as tolerated.  Repetitive spaced learning was employed today to elicit superior memory formation and behavioral change.  Class 3 severe obesity with serious comorbidity and body mass index (BMI) of 45.0 to 49.9 in adult, unspecified obesity type ([place]).  Nicosha is currently in the action stage of change and her goal is to continue with weight loss efforts. I recommend  Cathy Ho begin the structured treatment plan as follows:  She has agreed to the Category 4 Plan.  She will work on meal planning, intentional eating, and will stop all sugary drinks.  Exercise goals: All adults should avoid inactivity. Some physical activity is better than none, and adults who participate in any amount of physical activity gain some health benefits.   Behavioral modification strategies: increasing lean protein intake, decreasing simple carbohydrates, increasing vegetables, increasing water intake, decreasing eating out, no skipping meals, meal planning and cooking strategies, keeping healthy foods in the home and planning for success.  She was informed of the importance of frequent follow-up visits to maximize her success with intensive lifestyle modifications for her multiple health conditions. She was informed we would discuss her lab results at her next visit unless there is a critical issue that needs to be addressed sooner. Cathy Ho agreed to keep her next visit at the agreed upon time to discuss these results.  Objective:   Blood pressure 120/79, pulse 90, temperature 98.4 F (36.9 C), height 5\' 5"  (1.651 m), weight 284 lb (128.8 kg), last menstrual period 09/07/2019, SpO2 98 %. Body  mass index is 47.26 kg/m.  Indirect Calorimeter completed today shows a VO2 of 384 and a REE of 2673.  Her calculated basal metabolic rate is 4436 thus her basal metabolic rate is better than expected.  General: Cooperative, alert, well developed, in no acute distress. HEENT: Conjunctivae and lids unremarkable. Cardiovascular: Regular rhythm.  Lungs: Normal work of breathing. Neurologic: No focal deficits.   No results found for: CREATININE, BUN, NA, K, CL, CO2 No results found for: ALT, AST, GGT, ALKPHOS, BILITOT No results found for: HGBA1C No results found for: INSULIN No results found for: TSH No results found for: CHOL, HDL, LDLCALC, LDLDIRECT, TRIG, CHOLHDL Lab Results    Component Value Date   HGB 12.3 11/12/2012   No results found for: IRON, TIBC, FERRITIN  Attestation Statements:   Reviewed by clinician on day of visit: allergies, medications, problem list, medical history, surgical history, family history, social history, and previous encounter notes.  [name], am acting as Location manager for [employer], DO   I have reviewed the above documentation for accuracy and completeness, and I agree with the above. Jearld Lesch, DO

## 2019-10-24 ENCOUNTER — Encounter (INDEPENDENT_AMBULATORY_CARE_PROVIDER_SITE_OTHER): Payer: Self-pay

## 2019-10-29 ENCOUNTER — Encounter (INDEPENDENT_AMBULATORY_CARE_PROVIDER_SITE_OTHER): Payer: Self-pay | Admitting: Bariatrics

## 2019-10-29 ENCOUNTER — Ambulatory Visit (INDEPENDENT_AMBULATORY_CARE_PROVIDER_SITE_OTHER): Payer: 59 | Admitting: Bariatrics

## 2019-10-29 ENCOUNTER — Other Ambulatory Visit: Payer: Self-pay

## 2019-10-29 VITALS — BP 122/82 | HR 94 | Temp 98.1°F | Ht 65.0 in | Wt 276.0 lb

## 2019-10-29 DIAGNOSIS — I10 Essential (primary) hypertension: Secondary | ICD-10-CM | POA: Diagnosis not present

## 2019-10-29 DIAGNOSIS — E1169 Type 2 diabetes mellitus with other specified complication: Secondary | ICD-10-CM | POA: Diagnosis not present

## 2019-10-29 DIAGNOSIS — Z6841 Body Mass Index (BMI) 40.0 and over, adult: Secondary | ICD-10-CM

## 2019-10-29 DIAGNOSIS — E669 Obesity, unspecified: Secondary | ICD-10-CM | POA: Diagnosis not present

## 2019-10-29 NOTE — Progress Notes (Signed)
Chief Complaint:   OBESITY Cathy Ho is here to discuss her progress with her obesity treatment plan along with follow-up of her obesity related diagnoses. Cathy Ho is on the Category 4 Plan and states she is following her eating plan approximately 75% of the time. Cathy Ho states she is exercising 0 minutes 0 times per week.  Today's visit was #: 2 Starting weight: 284 lbs Starting date: 10/14/2019 Today's weight: 276 lbs Today's date: 10/29/2019 Total lbs lost to date: 8 Total lbs lost since last in-office visit: 8  Interim History: Cathy Ho is down 8 lbs. She is not an "egg" person, but is still doing okay with the plan overall.  Subjective:   Essential hypertension. Cathy Ho is taking lisinopril/HCTZ. Blood pressure is controlled.  BP Readings from Last 3 Encounters:  10/29/19 122/82  10/14/19 120/79  08/15/13 (!) 159/107   No results found for: CREATININE  Type 2 diabetes mellitus with obesity ([place]). Fasting blood sugars are decreased with 2-hour postprandials 162 and decreased to 118. Cathy Ho is taking Xigduo and Ozempic.  No results found for: HGBA1C No results found for: GLUF, MICROALBUR, LDLCALC, CREATININE No results found for: INSULIN  Assessment/Plan:   Essential hypertension. Cathy Ho is working on healthy weight loss and exercise to improve blood pressure control. We will watch for signs of hypotension as she continues her lifestyle modifications. She will her medications as directed.   Type 2 diabetes mellitus with obesity ([place]). Good blood sugar control is important to decrease the likelihood of diabetic complications such as nephropathy, neuropathy, limb loss, blindness, coronary artery disease, and death. Intensive lifestyle modification including diet, exercise and weight loss are the first line of treatment for diabetes. Cathy Ho will continue her medication as directed and will do random blood sugar checks.  Class 3 severe obesity with serious  comorbidity and body mass index (BMI) of 45.0 to 49.9 in adult, unspecified obesity type ([place]).  Cathy Ho is currently in the action stage of change. As such, her goal is to continue with weight loss efforts. She has agreed to the Category 4 Plan.   She will work on meal planning, intentional eating, and increasing her water intake to 80 oz of water daily.  We discussed options for lunch and dinner. She will get additional labs from her PCP.  Exercise goals: All adults should avoid inactivity. Some physical activity is better than none, and adults who participate in any amount of physical activity gain some health benefits.  Behavioral modification strategies: increasing lean protein intake, decreasing simple carbohydrates, increasing vegetables, increasing water intake, decreasing eating out, no skipping meals, meal planning and cooking strategies, keeping healthy foods in the home and planning for success.  Cathy Ho has agreed to follow-up with our clinic in 2 weeks. She was informed of the importance of frequent follow-up visits to maximize her success with intensive lifestyle modifications for her multiple health conditions.   Objective:   Blood pressure 122/82, pulse 94, temperature 98.1 F (36.7 C), height 5\' 5"  (1.651 m), weight 276 lb (125.2 kg), last menstrual period 10/08/2019, SpO2 99 %. Body mass index is 45.93 kg/m.  General: Cooperative, alert, well developed, in no acute distress. HEENT: Conjunctivae and lids unremarkable. Cardiovascular: Regular rhythm.  Lungs: Normal work of breathing. Neurologic: No focal deficits.   No results found for: CREATININE, BUN, NA, K, CL, CO2 No results found for: ALT, AST, GGT, ALKPHOS, BILITOT No results found for: HGBA1C No results found for: INSULIN No results found for: TSH No  results found for: CHOL, HDL, LDLCALC, LDLDIRECT, TRIG, CHOLHDL Lab Results  Component Value Date   HGB 12.3 11/12/2012   No results found for: IRON, TIBC,  FERRITIN  Attestation Statements:   Reviewed by clinician on day of visit: allergies, medications, problem list, medical history, surgical history, family history, social history, and previous encounter notes.  Time spent on visit including pre-visit chart review and post-visit charting and care was 30 minutes.   [name], am acting as Location manager for [employer], DO   I have reviewed the above documentation for accuracy and completeness, and I agree with the above. Jearld Lesch, DO

## 2019-11-06 ENCOUNTER — Other Ambulatory Visit (HOSPITAL_COMMUNITY): Payer: Self-pay | Admitting: Family Medicine

## 2019-11-11 ENCOUNTER — Encounter: Payer: Self-pay | Admitting: Bariatrics

## 2019-11-12 ENCOUNTER — Ambulatory Visit (INDEPENDENT_AMBULATORY_CARE_PROVIDER_SITE_OTHER): Payer: No Typology Code available for payment source | Admitting: Bariatrics

## 2019-11-12 ENCOUNTER — Other Ambulatory Visit: Payer: Self-pay

## 2019-11-12 ENCOUNTER — Encounter (INDEPENDENT_AMBULATORY_CARE_PROVIDER_SITE_OTHER): Payer: Self-pay | Admitting: Bariatrics

## 2019-11-12 VITALS — BP 137/77 | HR 72 | Temp 98.6°F | Ht 65.0 in | Wt 277.0 lb

## 2019-11-12 DIAGNOSIS — E1169 Type 2 diabetes mellitus with other specified complication: Secondary | ICD-10-CM

## 2019-11-12 DIAGNOSIS — I10 Essential (primary) hypertension: Secondary | ICD-10-CM

## 2019-11-12 DIAGNOSIS — Z6841 Body Mass Index (BMI) 40.0 and over, adult: Secondary | ICD-10-CM

## 2019-11-12 DIAGNOSIS — E669 Obesity, unspecified: Secondary | ICD-10-CM | POA: Diagnosis not present

## 2019-11-13 ENCOUNTER — Encounter (INDEPENDENT_AMBULATORY_CARE_PROVIDER_SITE_OTHER): Payer: Self-pay | Admitting: Bariatrics

## 2019-11-13 NOTE — Progress Notes (Signed)
Chief Complaint:   OBESITY Cathy Ho is here to discuss her progress with her obesity treatment plan along with follow-up of her obesity related diagnoses. Cathy Ho is on the Category 4 Plan and states she is following her eating plan approximately 60% of the time. [name] states she is exercising 0 minutes 0 times per week.  Today's visit was #: 3 Starting weight: 284 lbs Starting date: 10/14/2019 Today's weight: 277 lbs Today's date: 11/12/2019 Total lbs lost to date: 7 Total lbs lost since last in-office visit: 0  Interim History: Cathy Ho is up 1 lb, but has done well overall. She is up 2.3 lbs of water per the bioimpedance scale (on cycle). She reports doing better with her water intake.  Subjective:   Type 2 diabetes mellitus with obesity ([place]). Cathy Ho is taking Xigduo XR. Fasting blood sugars are in the range of 90 to 140's with 2-hour postprandials 130 to 140's.  No results found for: HGBA1C No results found for: GLUF, MICROALBUR, LDLCALC, CREATININE No results found for: INSULIN  Essential hypertension. Cathy Ho is taking Zestril. Blood pressure is improved.  BP Readings from Last 3 Encounters:  11/12/19 137/77  10/29/19 122/82  10/14/19 120/79   No results found for: CREATININE  Assessment/Plan:   Type 2 diabetes mellitus with obesity ([place]). Good blood sugar control is important to decrease the likelihood of diabetic complications such as nephropathy, neuropathy, limb loss, blindness, coronary artery disease, and death. Intensive lifestyle modification including diet, exercise and weight loss are the first line of treatment for diabetes. Cathy Ho will continue her medication as directed.   Essential hypertension. Cathy Ho is working on healthy weight loss and exercise to improve blood pressure control. We will watch for signs of hypotension as she continues her lifestyle modifications. She will continue her medication as directed.   Class 3 severe obesity with  serious comorbidity and body mass index (BMI) of 45.0 to 49.9 in adult, unspecified obesity type ([place]).  Cathy Ho is currently in the action stage of change. As such, her goal is to continue with weight loss efforts. She has agreed to the Category 4 Plan.   She will work on meal planning, intentional eating, and increasing her protein intake.   Exercise goals: All adults should avoid inactivity. Some physical activity is better than none, and adults who participate in any amount of physical activity gain some health benefits.  Behavioral modification strategies: increasing lean protein intake, decreasing simple carbohydrates, increasing vegetables, increasing water intake, decreasing liquid calories, decreasing eating out, no skipping meals, meal planning and cooking strategies, keeping healthy foods in the home and planning for success.  Cathy Ho has agreed to follow-up with our clinic in 2-3 weeks. She was informed of the importance of frequent follow-up visits to maximize her success with intensive lifestyle modifications for her multiple health conditions.   Objective:   Blood pressure 137/77, pulse 72, temperature 98.6 F (37 C), height 5\' 5"  (1.651 m), weight 277 lb (125.6 kg), SpO2 98 %. Body mass index is 46.1 kg/m.  General: Cooperative, alert, well developed, in no acute distress. HEENT: Conjunctivae and lids unremarkable. Cardiovascular: Regular rhythm.  Lungs: Normal work of breathing. Neurologic: No focal deficits.   No results found for: CREATININE, BUN, NA, K, CL, CO2 No results found for: ALT, AST, GGT, ALKPHOS, BILITOT No results found for: HGBA1C No results found for: INSULIN No results found for: TSH No results found for: CHOL, HDL, LDLCALC, LDLDIRECT, TRIG, CHOLHDL Lab Results  Component Value  Date   HGB 12.3 11/12/2012   No results found for: IRON, TIBC, FERRITIN  Attestation Statements:   Reviewed by clinician on day of visit: allergies, medications, problem  list, medical history, surgical history, family history, social history, and previous encounter notes.  Time spent on visit including pre-visit chart review and post-visit charting and care was 20 minutes.   Cathy Ho, am acting as Location manager for [employer], DO   I have reviewed the above documentation for accuracy and completeness, and I agree with the above. Cathy Lesch, DO

## 2019-11-18 ENCOUNTER — Other Ambulatory Visit (HOSPITAL_COMMUNITY): Payer: Self-pay | Admitting: Family Medicine

## 2019-11-26 ENCOUNTER — Other Ambulatory Visit: Payer: Self-pay

## 2019-11-26 ENCOUNTER — Ambulatory Visit (INDEPENDENT_AMBULATORY_CARE_PROVIDER_SITE_OTHER): Payer: No Typology Code available for payment source | Admitting: Bariatrics

## 2019-11-26 ENCOUNTER — Encounter (INDEPENDENT_AMBULATORY_CARE_PROVIDER_SITE_OTHER): Payer: Self-pay | Admitting: Bariatrics

## 2019-11-26 VITALS — BP 106/74 | HR 78 | Temp 98.0°F | Ht 65.0 in | Wt 274.0 lb

## 2019-11-26 DIAGNOSIS — E669 Obesity, unspecified: Secondary | ICD-10-CM

## 2019-11-26 DIAGNOSIS — Z6841 Body Mass Index (BMI) 40.0 and over, adult: Secondary | ICD-10-CM

## 2019-11-26 DIAGNOSIS — I10 Essential (primary) hypertension: Secondary | ICD-10-CM

## 2019-11-26 DIAGNOSIS — E559 Vitamin D deficiency, unspecified: Secondary | ICD-10-CM

## 2019-11-26 DIAGNOSIS — E1169 Type 2 diabetes mellitus with other specified complication: Secondary | ICD-10-CM | POA: Diagnosis not present

## 2019-11-26 MED ORDER — VITAMIN D (ERGOCALCIFEROL) 1.25 MG (50000 UNIT) PO CAPS: 50000.0000 [IU] | ORAL_CAPSULE | ORAL | 0 refills | Status: DC

## 2019-11-27 ENCOUNTER — Encounter (INDEPENDENT_AMBULATORY_CARE_PROVIDER_SITE_OTHER): Payer: Self-pay | Admitting: Bariatrics

## 2019-11-27 NOTE — Progress Notes (Signed)
Chief Complaint:   OBESITY Cathy Ho is here to discuss her progress with her obesity treatment plan along with follow-up of her obesity related diagnoses. Cathy Ho is on the Category 4 Plan and states she is following her eating plan approximately 65% of the time. [name] states she is exercising 0 minutes 0 times per week.  Today's visit was #: 4 Starting weight: 284 lbs Starting date: 10/14/2019 Today's weight: 274 lbs Today's date: 11/26/2019 Total lbs lost to date: 10 Total lbs lost since last in-office visit: 3  Interim History: Cathy Ho is down 3 lbs. She has struggled with timing with eating.  Subjective:   Type 2 diabetes mellitus with obesity ([place]). Cathy Ho is taking Xigduo XR. Fasting blood sugars are in the range of 130's to 140's with 2-hour postprandials 130's to 160's. A1c 6.5.  No results found for: HGBA1C No results found for: GLUF, MICROALBUR, LDLCALC, CREATININE No results found for: INSULIN  Essential hypertension. Cathy Ho is taking Zestril. Blood pressure is controlled.  BP Readings from Last 3 Encounters:  11/26/19 106/74  11/12/19 137/77  10/29/19 122/82   No results found for: CREATININE  Vitamin D deficiency. Cathy Ho is taking OTC supplementation. Vitamin D level 37.4.  Assessment/Plan:   Type 2 diabetes mellitus with obesity ([place]). Good blood sugar control is important to decrease the likelihood of diabetic complications such as nephropathy, neuropathy, limb loss, blindness, coronary artery disease, and death. Intensive lifestyle modification including diet, exercise and weight loss are the first line of treatment for diabetes. Cathy Ho will continue her medication as directed.   Essential hypertension. Cathy Ho is working on healthy weight loss and exercise to improve blood pressure control. We will watch for signs of hypotension as she continues her lifestyle modifications. She will continue her medication as directed.   Vitamin D deficiency.  Low Vitamin D level contributes to fatigue and are associated with obesity, breast, and colon cancer. She was given a prescription for Vitamin D, Ergocalciferol, (DRISDOL) 1.25 MG (50000 UNIT) CAPS capsule every week #4 with 0 refills and will follow-up for routine testing of Vitamin D, at least 2-3 times per year to avoid over-replacement.   Class 3 severe obesity with serious comorbidity and body mass index (BMI) of 45.0 to 49.9 in adult, unspecified obesity type ([place]).  [name] is currently in the action stage of change. As such, her goal is to continue with weight loss efforts. She has agreed to the Category 4 Plan.   She will work on meal planning, intentional eating, and increasing her water intake.   We reviewed with the patient labs from 11/11/2019 including CBC, CMP, A1c, Vitamin D, and lipids.  Exercise goals: Cathy Ho will start going to the gym for exercise.  Behavioral modification strategies: increasing lean protein intake, decreasing simple carbohydrates, increasing vegetables, increasing water intake, decreasing eating out, no skipping meals, meal planning and cooking strategies, keeping healthy foods in the home and planning for success.  Cathy Ho has agreed to follow-up with our clinic in 2-3 weeks. She was informed of the importance of frequent follow-up visits to maximize her success with intensive lifestyle modifications for her multiple health conditions.   Objective:   Blood pressure 106/74, pulse 78, temperature 98 F (36.7 C), height 5\' 5"  (1.651 m), weight 274 lb (124.3 kg), SpO2 98 %. Body mass index is 45.6 kg/m.  General: Cooperative, alert, well developed, in no acute distress. HEENT: Conjunctivae and lids unremarkable. Cardiovascular: Regular rhythm.  Lungs: Normal work of breathing. Neurologic:  No focal deficits.   No results found for: CREATININE, BUN, NA, K, CL, CO2 No results found for: ALT, AST, GGT, ALKPHOS, BILITOT No results found for: HGBA1C No  results found for: INSULIN No results found for: TSH No results found for: CHOL, HDL, LDLCALC, LDLDIRECT, TRIG, CHOLHDL Lab Results  Component Value Date   HGB 12.3 11/12/2012   No results found for: IRON, TIBC, FERRITIN  Attestation Statements:   Reviewed by clinician on day of visit: allergies, medications, problem list, medical history, surgical history, family history, social history, and previous encounter notes.  [name], am acting as Location manager for [employer], DO   I have reviewed the above documentation for accuracy and completeness, and I agree with the above. Jearld Lesch, DO

## 2019-12-17 ENCOUNTER — Ambulatory Visit (INDEPENDENT_AMBULATORY_CARE_PROVIDER_SITE_OTHER): Payer: No Typology Code available for payment source | Admitting: Bariatrics

## 2019-12-17 ENCOUNTER — Other Ambulatory Visit: Payer: Self-pay

## 2019-12-17 ENCOUNTER — Encounter (INDEPENDENT_AMBULATORY_CARE_PROVIDER_SITE_OTHER): Payer: Self-pay | Admitting: Bariatrics

## 2019-12-17 VITALS — BP 110/77 | HR 88 | Temp 98.6°F | Ht 65.0 in | Wt 263.0 lb

## 2019-12-17 DIAGNOSIS — Z6841 Body Mass Index (BMI) 40.0 and over, adult: Secondary | ICD-10-CM

## 2019-12-17 DIAGNOSIS — I10 Essential (primary) hypertension: Secondary | ICD-10-CM

## 2019-12-17 DIAGNOSIS — E559 Vitamin D deficiency, unspecified: Secondary | ICD-10-CM

## 2019-12-17 NOTE — Progress Notes (Signed)
Chief Complaint:   OBESITY Cathy Ho is here to discuss her progress with her obesity treatment plan along with follow-up of her obesity related diagnoses. Cathy Ho is on the Category 4 Plan and states she is following her eating plan approximately 20% of the time. Cathy Ho states she is exercising 0 minutes 0 times per week.  Today's visit was #: 5 Starting weight: 284 lbs Starting date: 10/14/2019 Today's weight: 263 lbs Today's date: 12/17/2019 Total lbs lost to date: 21 Total lbs lost since last in-office visit: 11  Interim History: Cathy Ho is down 11 lbs and doing well overall. Her appetite is well controlled. She is struggling with he water intake, but reports getting in more protein.  Subjective:   Vitamin D deficiency. Cathy Ho is taking Vitamin D supplementation.   Essential hypertension. Cathy Ho is taking Zestril. Blood pressure is controlled.  BP Readings from Last 3 Encounters:  12/17/19 110/77  11/26/19 106/74  11/12/19 137/77   No results found for: CREATININE  Assessment/Plan:   Vitamin D deficiency. Low Vitamin D level contributes to fatigue and are associated with obesity, breast, and colon cancer. She agrees to continue to take Vitamin D as directed and will follow-up for routine testing of Vitamin D, at least 2-3 times per year to avoid over-replacement.  Essential hypertension. Cathy Ho is working on healthy weight loss and exercise to improve blood pressure control. We will watch for signs of hypotension as she continues her lifestyle modifications. She will continue her medication as directed.   Class 3 severe obesity with serious comorbidity and body mass index (BMI) of 40.0 to 44.9 in adult, unspecified obesity type ([place]).  Cathy Ho is currently in the action stage of change. As such, her goal is to continue with weight loss efforts. She has agreed to the Category 4 Plan.   She will work on meal planning and increasing her water intake.  Exercise  goals: All adults should avoid inactivity. Some physical activity is better than none, and adults who participate in any amount of physical activity gain some health benefits.  Behavioral modification strategies: increasing lean protein intake, decreasing simple carbohydrates, increasing vegetables, increasing water intake, decreasing eating out, no skipping meals, meal planning and cooking strategies, keeping healthy foods in the home and planning for success.  Cathy Ho has agreed to follow-up with our clinic in 2-3 weeks. She was informed of the importance of frequent follow-up visits to maximize her success with intensive lifestyle modifications for her multiple health conditions.   Objective:   Blood pressure 110/77, pulse 88, temperature 98.6 F (37 C), height 5\' 5"  (1.651 m), weight 263 lb (119.3 kg), SpO2 99 %. Body mass index is 43.77 kg/m.  General: Cooperative, alert, well developed, in no acute distress. HEENT: Conjunctivae and lids unremarkable. Cardiovascular: Regular rhythm.  Lungs: Normal work of breathing. Neurologic: No focal deficits.   No results found for: CREATININE, BUN, NA, K, CL, CO2 No results found for: ALT, AST, GGT, ALKPHOS, BILITOT No results found for: HGBA1C No results found for: INSULIN No results found for: TSH No results found for: CHOL, HDL, LDLCALC, LDLDIRECT, TRIG, CHOLHDL Lab Results  Component Value Date   HGB 12.3 11/12/2012   No results found for: IRON, TIBC, FERRITIN  Attestation Statements:   Reviewed by clinician on day of visit: allergies, medications, problem list, medical history, surgical history, family history, social history, and previous encounter notes.  Time spent on visit including pre-visit chart review and post-visit charting and care was  20 minutes.   [name], am acting as Location manager for [employer], DO   I have reviewed the above documentation for accuracy and completeness, and I agree with the above. Jearld Lesch, DO

## 2019-12-18 ENCOUNTER — Encounter (INDEPENDENT_AMBULATORY_CARE_PROVIDER_SITE_OTHER): Payer: Self-pay | Admitting: Bariatrics

## 2019-12-22 ENCOUNTER — Encounter (INDEPENDENT_AMBULATORY_CARE_PROVIDER_SITE_OTHER): Payer: Self-pay | Admitting: Bariatrics

## 2019-12-23 ENCOUNTER — Other Ambulatory Visit (INDEPENDENT_AMBULATORY_CARE_PROVIDER_SITE_OTHER): Payer: Self-pay | Admitting: Bariatrics

## 2019-12-23 DIAGNOSIS — E559 Vitamin D deficiency, unspecified: Secondary | ICD-10-CM

## 2019-12-23 MED ORDER — VITAMIN D (ERGOCALCIFEROL) 1.25 MG (50000 UNIT) PO CAPS: 50000.0000 [IU] | ORAL_CAPSULE | ORAL | 0 refills | Status: DC

## 2019-12-23 NOTE — Telephone Encounter (Signed)
Please review

## 2020-01-06 ENCOUNTER — Ambulatory Visit (INDEPENDENT_AMBULATORY_CARE_PROVIDER_SITE_OTHER): Payer: No Typology Code available for payment source | Admitting: Bariatrics

## 2020-01-13 ENCOUNTER — Ambulatory Visit (INDEPENDENT_AMBULATORY_CARE_PROVIDER_SITE_OTHER): Payer: No Typology Code available for payment source | Admitting: Family Medicine

## 2020-01-13 ENCOUNTER — Other Ambulatory Visit: Payer: Self-pay

## 2020-01-13 ENCOUNTER — Encounter (INDEPENDENT_AMBULATORY_CARE_PROVIDER_SITE_OTHER): Payer: Self-pay | Admitting: Family Medicine

## 2020-01-13 VITALS — BP 123/76 | HR 91 | Temp 98.6°F | Ht 65.0 in | Wt 260.0 lb

## 2020-01-13 DIAGNOSIS — E1169 Type 2 diabetes mellitus with other specified complication: Secondary | ICD-10-CM

## 2020-01-13 DIAGNOSIS — Z6841 Body Mass Index (BMI) 40.0 and over, adult: Secondary | ICD-10-CM

## 2020-01-13 DIAGNOSIS — E1129 Type 2 diabetes mellitus with other diabetic kidney complication: Secondary | ICD-10-CM | POA: Insufficient documentation

## 2020-01-14 ENCOUNTER — Encounter (INDEPENDENT_AMBULATORY_CARE_PROVIDER_SITE_OTHER): Payer: Self-pay | Admitting: Family Medicine

## 2020-01-14 NOTE — Progress Notes (Signed)
     Chief Complaint:   OBESITY Cathy Ho is here to discuss her progress with her obesity treatment plan along with follow-up of her obesity related diagnoses. Cathy Ho is on the Category 4 Plan and states she is following her eating plan approximately 60% of the time. Cathy Ho states she is doing 0 minutes 0 times per week.  Today's visit was #: 6 Starting weight: 284 lbs Starting date: 10/14/2019 Today's weight: 260 lbs Today's date: 01/13/2020 Total lbs lost to date: 24 Total lbs lost since last in-office visit: 3  Interim History: Cathy Ho is only eating 4 to 5 oz of meat at supper, and then is too full to eat the rest.  Her hunger is satisfied. She notes she has sweet cravings.  Subjective:   1. Type 2 diabetes mellitus with other specified complication, without long-term current use of insulin ([place]) Cathy Ho was started on Ozempic in April per her primary care physician. She feels this curbs her appetite. She reports her last A1c was 6.5, which is down from 10.5. Her diabetes mellitus is well controlled. Her CBGs range between 108 and 112.  No results found for: HGBA1C No results found for: GLUF, MICROALBUR, LDLCALC, CREATININE No results found for: INSULIN  Assessment/Plan:   1. Type 2 diabetes mellitus with other specified complication, without long-term current use of insulin ([place])  Cathy Ho agreed to continue all her medications.  2. Class 3 severe obesity with serious comorbidity and body mass index (BMI) of 40.0 to 44.9 in adult, unspecified obesity type ([facility]) Cathy Ho is currently in the action stage of change. As such, her goal is to continue with weight loss efforts. She has agreed to the Category 4 Plan.   Handout given today: Protein Equivalents.   Exercise goals: Cathy Ho is to consider exercise.  Behavioral modification strategies: increasing lean protein intake and increasing water intake.  Susanna has agreed to follow-up with our clinic in 2 to 3 weeks.  Objective:     Blood pressure 123/76, pulse 91, temperature 98.6 F (37 C), temperature source Oral, height 5\' 5"  (1.651 m), weight 260 lb (117.9 kg), SpO2 100 %. Body mass index is 43.27 kg/m.  General: Cooperative, alert, well developed, in no acute distress. HEENT: Conjunctivae and lids unremarkable. Cardiovascular: Regular rhythm.  Lungs: Normal work of breathing. Neurologic: No focal deficits.   No results found for: CREATININE, BUN, NA, K, CL, CO2 No results found for: ALT, AST, GGT, ALKPHOS, BILITOT No results found for: HGBA1C No results found for: INSULIN No results found for: TSH No results found for: CHOL, HDL, LDLCALC, LDLDIRECT, TRIG, CHOLHDL Lab Results  Component Value Date   HGB 12.3 11/12/2012   No results found for: IRON, TIBC, FERRITIN  Attestation Statements:   Reviewed by clinician on day of visit: allergies, medications, problem list, medical history, surgical history, family history, social history, and previous encounter notes.   Cathy Ho, am acting as Location manager for [employer], FNP-C.  I have reviewed the above documentation for accuracy and completeness, and I agree with the above. -  Cathy Fick, FNP

## 2020-02-03 ENCOUNTER — Ambulatory Visit (INDEPENDENT_AMBULATORY_CARE_PROVIDER_SITE_OTHER): Payer: No Typology Code available for payment source | Admitting: Family Medicine

## 2020-02-10 ENCOUNTER — Ambulatory Visit (INDEPENDENT_AMBULATORY_CARE_PROVIDER_SITE_OTHER): Payer: No Typology Code available for payment source | Admitting: Family Medicine

## 2020-02-10 ENCOUNTER — Other Ambulatory Visit: Payer: Self-pay

## 2020-02-10 ENCOUNTER — Other Ambulatory Visit (INDEPENDENT_AMBULATORY_CARE_PROVIDER_SITE_OTHER): Payer: Self-pay | Admitting: Family Medicine

## 2020-02-10 ENCOUNTER — Encounter (INDEPENDENT_AMBULATORY_CARE_PROVIDER_SITE_OTHER): Payer: Self-pay | Admitting: Family Medicine

## 2020-02-10 VITALS — BP 112/76 | HR 82 | Temp 98.5°F | Ht 65.0 in | Wt 255.0 lb

## 2020-02-10 DIAGNOSIS — Z9189 Other specified personal risk factors, not elsewhere classified: Secondary | ICD-10-CM | POA: Diagnosis not present

## 2020-02-10 DIAGNOSIS — F3289 Other specified depressive episodes: Secondary | ICD-10-CM | POA: Diagnosis not present

## 2020-02-10 DIAGNOSIS — E559 Vitamin D deficiency, unspecified: Secondary | ICD-10-CM | POA: Diagnosis not present

## 2020-02-10 DIAGNOSIS — E1169 Type 2 diabetes mellitus with other specified complication: Secondary | ICD-10-CM

## 2020-02-10 DIAGNOSIS — Z6841 Body Mass Index (BMI) 40.0 and over, adult: Secondary | ICD-10-CM

## 2020-02-10 MED ORDER — VITAMIN D (ERGOCALCIFEROL) 1.25 MG (50000 UNIT) PO CAPS: 50000.0000 [IU] | ORAL_CAPSULE | ORAL | 0 refills | Status: DC

## 2020-02-10 MED ORDER — BUPROPION HCL ER (SR) 150 MG PO TB12: 150.0000 mg | ORAL_TABLET | Freq: Every day | ORAL | 0 refills | Status: DC

## 2020-02-11 ENCOUNTER — Encounter (INDEPENDENT_AMBULATORY_CARE_PROVIDER_SITE_OTHER): Payer: Self-pay | Admitting: Family Medicine

## 2020-02-11 DIAGNOSIS — F32A Depression, unspecified: Secondary | ICD-10-CM | POA: Insufficient documentation

## 2020-02-11 NOTE — Progress Notes (Addendum)
Chief Complaint:   OBESITY Cathy Ho is here to discuss her progress with her obesity treatment plan along with follow-up of her obesity related diagnoses. Cathy Ho is on the Category 4 Plan and states she is following her eating plan approximately 60% of the time. Cathy Ho states she is exercising 0 minutes 0 times per week.  Today's visit was #: 7 Starting weight: 284 lbs Starting date: 10/14/2019 Today's weight: 255 lbs Today's date: 02/10/2020 Total lbs lost to date: 29 Total lbs lost since last in-office visit: 5  Interim History: Cathy Ho notes she goes over on extra calories at times. She is now getting all of the protein in by using protein exchanges. She struggles with sweets cravings. She is down 54 lbs overall from a top weight of 309 lbs.   Subjective:   Other depression with emotional eating.   Cathy Ho struggles with temptation for sweets.   Vitamin D deficiency. She is on Rx Vit D supplementation.   Type 2 diabetes mellitus with other specified complication, without long-term current use of insulin ([place]). Diabetes is well controlled on Ozempic and Xigduo. CBG's are reported to be in the 90's with 2-hour postprandials in the 130's. Last A1c was 6.5 11/11/19 at her PCP.  No results found for: HGBA1C No results found for: GLUF, MICROALBUR, LDLCALC, CREATININE No results found for: INSULIN  At risk for side effect of medication. Cathy Ho is at risk of side effects from starting bupropion.  Assessment/Plan:   Other depression with emotional eating.   New prescription was given for buPROPion (WELLBUTRIN SR) 150 MG 12 hr tablet QAM #30 with 0 refills.  She has no history of seizures or glaucoma.  We discussed strategies for craving control.  Vitamin D deficiency.   She was given a refill on her Vitamin D, Ergocalciferol, (DRISDOL) 1.25 MG (50000 UNIT) CAPS capsule every week #4 with 0 refills.  Type 2 diabetes mellitus with other specified complication,  without long-term current use of insulin ([place]).   Cathy Ho will continue all medications as directed. She will work on reducing intake of simple carbs.   At risk for side effect of medication. Cathy Ho was given approximately 15 minutes of drug side effect counseling today. We discussed side effect possibility and risk versus benefits. Cathy Ho agreed to the medication and will contact this office if these side effects are intolerable.  Repetitive spaced learning was employed today to elicit superior memory formation and behavioral change.  Class 3 severe obesity with serious comorbidity and body mass index (BMI) of 40.0 to 44.9 in adult, unspecified obesity type ([place]).  Cathy Ho is currently in the action stage of change. As such, her goal is to continue with weight loss efforts. She has agreed to the Category 4 Plan.   Exercise goals: No exercise has been prescribed at this time.  Behavioral modification strategies: increasing lean protein intake, decreasing simple carbohydrates and better snacking choices (portion control sweets).  Cathy Ho has agreed to follow-up with our clinic fasting in 3 weeks.   Objective:   Blood pressure 112/76, pulse 82, temperature 98.5 F (36.9 C), height 5\' 5"  (1.651 m), weight 255 lb (115.7 kg), SpO2 100 %. Body mass index is 42.43 kg/m.  General: Cooperative, alert, well developed, in no acute distress. HEENT: Conjunctivae and lids unremarkable. Cardiovascular: Regular rhythm.  Lungs: Normal work of breathing. Neurologic: No focal deficits.   No results found for: CREATININE, BUN, NA, K, CL, CO2 No results found for: ALT, AST, GGT, ALKPHOS,  BILITOT No results found for: HGBA1C No results found for: INSULIN No results found for: TSH No results found for: CHOL, HDL, LDLCALC, LDLDIRECT, TRIG, CHOLHDL Lab Results  Component Value Date   HGB 12.3 11/12/2012   No results found for: IRON, TIBC, FERRITIN  Attestation Statements:   Reviewed by clinician  on day of visit: allergies, medications, problem list, medical history, surgical history, family history, social history, and previous encounter notes.  IMichaelene Song, am acting as Location manager for [employer], FNP-C   I have reviewed the above documentation for accuracy and completeness, and I agree with the above. -  Georgianne Fick, FNP

## 2020-02-17 ENCOUNTER — Ambulatory Visit
Admission: RE | Admit: 2020-02-17 | Discharge: 2020-02-17 | Disposition: A | Discharge status: Home / Self Care | Payer: No Typology Code available for payment source | Source: Ambulatory Visit | Attending: Family Medicine | Admitting: Family Medicine

## 2020-02-17 ENCOUNTER — Other Ambulatory Visit: Payer: Self-pay

## 2020-02-17 ENCOUNTER — Ambulatory Visit: Payer: 59

## 2020-02-17 DIAGNOSIS — N6459 Other signs and symptoms in breast: Secondary | ICD-10-CM

## 2020-02-17 DIAGNOSIS — R234 Changes in skin texture: Secondary | ICD-10-CM

## 2020-02-17 DIAGNOSIS — L299 Pruritus, unspecified: Secondary | ICD-10-CM

## 2020-03-02 ENCOUNTER — Other Ambulatory Visit: Payer: Self-pay

## 2020-03-02 ENCOUNTER — Encounter (INDEPENDENT_AMBULATORY_CARE_PROVIDER_SITE_OTHER): Payer: Self-pay | Admitting: Family Medicine

## 2020-03-02 ENCOUNTER — Ambulatory Visit (INDEPENDENT_AMBULATORY_CARE_PROVIDER_SITE_OTHER): Payer: No Typology Code available for payment source | Admitting: Family Medicine

## 2020-03-02 VITALS — BP 119/80 | HR 81 | Temp 98.3°F | Ht 65.0 in | Wt 250.0 lb

## 2020-03-02 DIAGNOSIS — Z6841 Body Mass Index (BMI) 40.0 and over, adult: Secondary | ICD-10-CM

## 2020-03-02 DIAGNOSIS — E1169 Type 2 diabetes mellitus with other specified complication: Secondary | ICD-10-CM

## 2020-03-02 DIAGNOSIS — M5442 Lumbago with sciatica, left side: Secondary | ICD-10-CM

## 2020-03-03 NOTE — Progress Notes (Signed)
Chief Complaint:   OBESITY Cathy Ho is here to discuss her progress with her obesity treatment plan along with follow-up of her obesity related diagnoses. Cathy Ho is on the Category 4 Plan and states she is following her eating plan approximately 60% of the time. Cathy Ho states she is doing 0 minutes 0 times per week.  Today's visit was #: 8 Starting weight: 284 lbs Starting date: 10/14/2019 Today's weight: 250 lbs Today's date: 03/02/2020 Total lbs lost to date: 34 Total lbs lost since last in-office visit: 5  Interim History: Cathy Ho has not been always getting in enough protein. She sometimes skips breakfast because she forgets her shake. Overall she has done very well with a total loss of 34 lbs since October 14, 2019.  Subjective:   1. Acute right-sided low back pain with left-sided sciatica Cathy Ho notes back pain on lower right back, but numbness down the lower left leg. She notes back spasms. She is using ibuprofen and muscle relaxers with some benefit. She denies bowel or urinary incontinence.   2. Type 2 diabetes mellitus with other specified complication, without long-term current use of insulin (Cathy Ho) Cathy Ho's diabetes mellutis is well controlled on Xigduo and Ozempic 1 mg. Last A1c was 6.5 on 11/11/2019 (at PCP), and CBGs range between 93 and 119. She feels less appetite suppression from Ozempic now than when she first started this dose.   No results found for: HGBA1C No results found for: GLUF, MICROALBUR, LDLCALC, CREATININE No results found for: INSULIN  Assessment/Plan:   1. Acute right-sided low back pain with left-sided sciatica Cathy Ho will make an appointment with her primary care physician to discuss her back pain.  2. Type 2 diabetes mellitus with other specified complication, without long-term current use of insulin (Cathy Ho) . Cathy Ho will continue all her medications, and will continue to follow up as directed.  3. Class 3 severe obesity with serious  comorbidity and body mass index (BMI) of 40.0 to 44.9 in adult, unspecified obesity type ([facility]) Cathy Ho is currently in the action stage of change. As such, her goal is to continue with weight loss efforts. She has agreed to the Category 4 Plan.   Cathy Ho will have a shake or protein bar for breakfast.  Exercise goals: She will consider exercise- what will work for her and how to incorporate it into her schedule.   Behavioral modification strategies: increasing lean protein intake, no skipping meals and meal planning and cooking strategies.  Cathy Ho has agreed to follow-up with our clinic in 3 weeks.  Objective:   Blood pressure 119/80, pulse 81, temperature 98.3 F (36.8 C), height 5\' 5"  (1.651 m), weight 250 lb (113.4 kg), SpO2 99 %. Body mass index is 41.6 kg/m.  General: Cooperative, alert, well developed, in no acute distress. HEENT: Conjunctivae and lids unremarkable. Cardiovascular: Regular rhythm.  Lungs: Normal work of breathing. Neurologic: No focal deficits.   No results found for: CREATININE, BUN, NA, K, CL, CO2 No results found for: ALT, AST, GGT, ALKPHOS, BILITOT No results found for: HGBA1C No results found for: INSULIN No results found for: TSH No results found for: CHOL, HDL, LDLCALC, LDLDIRECT, TRIG, CHOLHDL Lab Results  Component Value Date   HGB 12.3 11/12/2012   No results found for: IRON, TIBC, FERRITIN  Attestation Statements:   Reviewed by clinician on day of visit: allergies, medications, problem list, medical history, surgical history, family history, social history, and previous encounter notes.   Wilhemena Durie, am acting as Location manager for [employer]  Fartun Paradiso, FNP-C.  I have reviewed the above documentation for accuracy and completeness, and I agree with the above. -  Georgianne Fick, FNP

## 2020-03-04 ENCOUNTER — Encounter (INDEPENDENT_AMBULATORY_CARE_PROVIDER_SITE_OTHER): Payer: Self-pay | Admitting: Family Medicine

## 2020-03-09 ENCOUNTER — Encounter (INDEPENDENT_AMBULATORY_CARE_PROVIDER_SITE_OTHER): Payer: Self-pay | Admitting: Family Medicine

## 2020-03-09 ENCOUNTER — Other Ambulatory Visit (HOSPITAL_COMMUNITY): Payer: Self-pay | Admitting: Family Medicine

## 2020-03-09 DIAGNOSIS — F3289 Other specified depressive episodes: Secondary | ICD-10-CM

## 2020-03-09 NOTE — Telephone Encounter (Signed)
Refill request

## 2020-03-10 ENCOUNTER — Other Ambulatory Visit (INDEPENDENT_AMBULATORY_CARE_PROVIDER_SITE_OTHER): Payer: Self-pay | Admitting: Family Medicine

## 2020-03-10 LAB — CBC AND DIFFERENTIAL: WBC: 9.7

## 2020-03-10 LAB — CBC: RBC: 4.55 (ref 3.87–5.11)

## 2020-03-10 MED ORDER — BUPROPION HCL ER (SR) 150 MG PO TB12: 150.0000 mg | ORAL_TABLET | Freq: Every day | ORAL | 0 refills | Status: DC

## 2020-03-11 LAB — COMPREHENSIVE METABOLIC PANEL
Albumin: 4.1 (ref 3.5–5.0)
Globulin: 3.3

## 2020-03-11 LAB — MICROALBUMIN, URINE: Microalb, Ur: 62.8

## 2020-03-11 LAB — BASIC METABOLIC PANEL
Chloride: 101 (ref 99–108)
Glucose: 80
Potassium: 4.4 (ref 3.4–5.3)
Sodium: 136 — AB (ref 137–147)
Sodium: 136 — AB (ref 137–147)
Sodium: 26 — AB (ref 137–147)

## 2020-03-11 LAB — VITAMIN D 25 HYDROXY (VIT D DEFICIENCY, FRACTURES): Vit D, 25-Hydroxy: 62.5

## 2020-03-11 LAB — HEPATIC FUNCTION PANEL
ALT: 12 (ref 7–35)
AST: 21 (ref 13–35)
Bilirubin, Total: 0.4

## 2020-03-11 LAB — LIPID PANEL
Cholesterol: 119 (ref 0–200)
HDL: 29 — AB (ref 35–70)
LDL Cholesterol: 3
LDl/HDL Ratio: 77

## 2020-03-11 LAB — HEMOGLOBIN A1C: Hemoglobin A1C: 5.9

## 2020-03-23 ENCOUNTER — Other Ambulatory Visit: Payer: Self-pay

## 2020-03-23 ENCOUNTER — Ambulatory Visit (INDEPENDENT_AMBULATORY_CARE_PROVIDER_SITE_OTHER): Payer: No Typology Code available for payment source | Admitting: Family Medicine

## 2020-03-23 ENCOUNTER — Encounter (INDEPENDENT_AMBULATORY_CARE_PROVIDER_SITE_OTHER): Payer: Self-pay | Admitting: Family Medicine

## 2020-03-23 VITALS — BP 139/82 | HR 89 | Temp 98.8°F | Ht 65.0 in | Wt 244.0 lb

## 2020-03-23 DIAGNOSIS — R809 Proteinuria, unspecified: Secondary | ICD-10-CM | POA: Diagnosis not present

## 2020-03-23 DIAGNOSIS — E1129 Type 2 diabetes mellitus with other diabetic kidney complication: Secondary | ICD-10-CM

## 2020-03-23 DIAGNOSIS — Z6841 Body Mass Index (BMI) 40.0 and over, adult: Secondary | ICD-10-CM

## 2020-03-23 DIAGNOSIS — E559 Vitamin D deficiency, unspecified: Secondary | ICD-10-CM | POA: Diagnosis not present

## 2020-03-23 NOTE — Progress Notes (Signed)
Chief Complaint:   OBESITY Cathy Ho is here to discuss her progress with her obesity treatment plan along with follow-up of her obesity related diagnoses. Cathy Ho is on the Category 4 Plan and states she is following her eating plan approximately 80% of the time. Cathy Ho states she is doing treadmill/strengthening/walking 60 minutes 1 time per week.  Today's visit was #: 9 Starting weight: 284 lbs Starting date: 10/14/2019 Today's weight: 244 lbs Today's date: 03/23/2020 Total lbs lost to date: 40 Total lbs lost since last in-office visit: 6  Interim History: Cathy Ho is not skipping breakfast now. She has added a protein shake in the a.m. She has also started doing some exercise about once per week as discussed at her last OV.. She did allow herself to have some non-plan food over Thanksgiving and still lost 6 lbs. Cathy Ho is doing a great job on our plan and is down a total of 40 lbs since 10/14/19.  Subjective:   Type 2 diabetes mellitus with microalbuminuria, without long-term current use of insulin ([facility]) -  with Microalbumin. Diabetes is well controlled. She is on dapagliflozin/metformin and Ozempic.  A1c was 5.9 on 03/11/2020 at PCP's office. CBG's range 86-127. She checks CBGs sporadically. Cathy Ho is not on a statin. Last lab tests showed positive microalbumin.   Lab Results  Component Value Date   HGBA1C 5.9 03/11/2020   Lab Results  Component Value Date   MICROALBUR 62.8 03/11/2020   [place] 3 03/11/2020   No results found for: INSULIN  Vitamin D deficiency. Vitamin D level was 62.5 (at goal) on 03/11/2020. Cathy Ho is on OTC Vitamin D 5,000 daily.   Ref. Range 03/11/2020 00:00  Vitamin D, 25-Hydroxy Unknown 62.5   Assessment/Plan:   Type 2 diabetes mellitus with microalbuminuria, without long-term current use of insulin ([facility]) -  with Microalbumin. Cathy Ho will continue her medication as directed. I suggested a statin and she will discuss with her PCP.  She will check with her PCP about switching from Ozempic to [facility].  Vitamin D deficiency. . She agrees to continue to take OTC Vitamin D 5,000 daily and will follow-up for routine testing of Vitamin D, at least 2-3 times per year to avoid over-replacement.  Class 3 severe obesity with serious comorbidity and body mass index (BMI) of 40.0 to 44.9 in adult, unspecified obesity type ([place]).  Cathy Ho is currently in the action stage of change. As such, her goal is to continue with weight loss efforts. She has agreed to the Category 4 Plan.   Exercise goals: Cathy Ho will continue to exercise and increase to 2 times per week.  Behavioral modification strategies: meal planning and cooking strategies and planning for success.  Cathy Ho has agreed to follow-up with our clinic in 3 weeks.   Objective:   Blood pressure 139/82, pulse 89, temperature 98.8 F (37.1 C), height 5\' 5"  (1.651 m), weight 244 lb (110.7 kg), SpO2 99 %. Body mass index is 40.6 kg/m.  General: Cooperative, alert, well developed, in no acute distress. HEENT: Conjunctivae and lids unremarkable. Cardiovascular: Regular rhythm.  Lungs: Normal work of breathing. Neurologic: No focal deficits.   Lab Results  Component Value Date   NA 26 (A) 03/11/2020   NA 136 (A) 03/11/2020   NA 136 (A) 03/11/2020   K 4.4 03/11/2020   CL 101 03/11/2020   Lab Results  Component Value Date   ALT 12 03/11/2020   AST 21 03/11/2020   Lab Results  Component Value  Date   HGBA1C 5.9 03/11/2020   No results found for: INSULIN No results found for: TSH Lab Results  Component Value Date   CHOL 119 03/11/2020   HDL 29 (A) 03/11/2020   LDLCALC 3 03/11/2020   Lab Results  Component Value Date   WBC 9.7 03/10/2020   HGB 12.3 11/12/2012   No results found for: IRON, TIBC, FERRITIN  Attestation Statements:   Reviewed by clinician on day of visit: allergies, medications, problem list, medical history, surgical history, family history,  social history, and previous encounter notes.  IMichaelene Song, am acting as Location manager for [employer], FNP-C   I have reviewed the above documentation for accuracy and completeness, and I agree with the above. -  Georgianne Fick, FNP

## 2020-04-01 ENCOUNTER — Other Ambulatory Visit (HOSPITAL_COMMUNITY): Payer: Self-pay | Admitting: Surgery

## 2020-04-06 ENCOUNTER — Encounter (INDEPENDENT_AMBULATORY_CARE_PROVIDER_SITE_OTHER): Payer: Self-pay | Admitting: Family Medicine

## 2020-04-06 ENCOUNTER — Other Ambulatory Visit (INDEPENDENT_AMBULATORY_CARE_PROVIDER_SITE_OTHER): Payer: Self-pay | Admitting: Family Medicine

## 2020-04-06 ENCOUNTER — Ambulatory Visit (INDEPENDENT_AMBULATORY_CARE_PROVIDER_SITE_OTHER): Payer: No Typology Code available for payment source | Admitting: Family Medicine

## 2020-04-06 ENCOUNTER — Other Ambulatory Visit: Payer: Self-pay

## 2020-04-06 VITALS — BP 114/78 | HR 105 | Temp 98.3°F | Ht 65.0 in | Wt 235.0 lb

## 2020-04-06 DIAGNOSIS — E1129 Type 2 diabetes mellitus with other diabetic kidney complication: Secondary | ICD-10-CM | POA: Diagnosis not present

## 2020-04-06 DIAGNOSIS — E1159 Type 2 diabetes mellitus with other circulatory complications: Secondary | ICD-10-CM | POA: Diagnosis not present

## 2020-04-06 DIAGNOSIS — Z6839 Body mass index (BMI) 39.0-39.9, adult: Secondary | ICD-10-CM

## 2020-04-06 DIAGNOSIS — I152 Hypertension secondary to endocrine disorders: Secondary | ICD-10-CM | POA: Diagnosis not present

## 2020-04-06 DIAGNOSIS — R809 Proteinuria, unspecified: Secondary | ICD-10-CM

## 2020-04-06 MED ORDER — LISINOPRIL-HYDROCHLOROTHIAZIDE 10-12.5 MG PO TABS: 1.0000 | ORAL_TABLET | Freq: Every day | ORAL | 0 refills | Status: DC

## 2020-04-06 NOTE — Progress Notes (Signed)
Chief Complaint:   OBESITY Cathy Ho is here to discuss her progress with her obesity treatment plan along with follow-up of her obesity related diagnoses. Cathy Ho is on the Category 4 Plan and states she is following her eating plan approximately 80% of the time. Cathy Ho states she is walking or at the gym 30 minutes 1 time per week.  Today's visit was #: 10 Starting weight: 284 lbs Starting date: 10/14/2019 Today's weight: 235 lbs Today's date: 04/06/2020 Total lbs lost to date: 49 Total lbs lost since last in-office visit: 9  Interim History: Cathy Ho has done an awesome job with plan adherenace and she is down 49 lbs total. She did well over Thanksgiving. She is eating all of the food on the plan. She denies excessive hunger.  Subjective:   1. Hypertension associated with type 2 diabetes mellitus (Cathy Ho) Cathy Ho notes some dizziness recently. She had a home BP reading of 103/59. She is on lisinopril-hydrochlorothiazide 20-12.5 mg. She notes that she does like how the HCTZ helps with swelling as well as BP.  BP Readings from Last 3 Encounters:  04/06/20 114/78  03/23/20 139/82  03/02/20 119/80   No results found for: CREATININE  2. Diabetes mellitus with microalbuminuria (Cathy Ho) Cathy Ho is on Xigduo and Ozempic, and she denies hypoglycemia. Her fasting BGs range between 80 and 100, and 2 hour post prandial range between 112 and 128. Her primary care physician does not want her on statin at this time.  Lab Results  Component Value Date   HGBA1C 5.9 03/11/2020   Lab Results  Component Value Date   MICROALBUR 62.8 03/11/2020   Cathy Ho 3 03/11/2020   No results found for: INSULIN  Assessment/Plan:   1. Hypertension associated with type 2 diabetes mellitus (Cathy Ho)  Cathy Ho agreed to decrease lisinopril-hydrochlorothiazide dose to 10-12.5 mg daily and we will refill for 90 days with no refills.  - lisinopril-hydrochlorothiazide (ZESTORETIC) 10-12.5 MG tablet; Take 1 tablet by  mouth daily.  Dispense: 90 tablet; Refill: 0  2. Diabetes mellitus with microalbuminuria (Cathy Ho) . Cathy Ho will continue her current medications, and will continue to follow up as directed.  3. Class 2 severe obesity with serious comorbidity and body mass index (BMI) of 39.0 to 39.9 in adult, unspecified obesity type (HCC) Cathy Ho is currently in the action stage of change. As such, her goal is to continue with weight loss efforts. She has agreed to the Category 4 Plan.   Exercise goals: As is, but she is to increase exercise to 2 times per week.  Behavioral modification strategies: holiday eating strategies  and planning for success.  Cathy Ho has agreed to follow-up with our clinic in 3 weeks.   Objective:   Blood pressure 114/78, pulse (!) 105, temperature 98.3 F (36.8 C), height 5\' 5"  (1.651 m), weight 235 lb (106.6 kg), SpO2 (!) 88 %. Body mass index is 39.11 kg/m.  General: Cooperative, alert, well developed, in no acute distress. HEENT: Conjunctivae and lids unremarkable. Cardiovascular: Regular rhythm.  Lungs: Normal work of breathing. Neurologic: No focal deficits.   Lab Results  Component Value Date   NA 26 (A) 03/11/2020   NA 136 (A) 03/11/2020   NA 136 (A) 03/11/2020   K 4.4 03/11/2020   CL 101 03/11/2020   Lab Results  Component Value Date   ALT 12 03/11/2020   AST 21 03/11/2020   Lab Results  Component Value Date   HGBA1C 5.9 03/11/2020   No results found for: INSULIN No results  found for: TSH Lab Results  Component Value Date   CHOL 119 03/11/2020   HDL 29 (A) 03/11/2020   LDLCALC 3 03/11/2020   Lab Results  Component Value Date   WBC 9.7 03/10/2020   HGB 12.3 11/12/2012   No results found for: IRON, TIBC, FERRITIN  Attestation Statements:   Reviewed by clinician on day of visit: allergies, medications, problem list, medical history, surgical history, family history, social history, and previous encounter notes.   Wilhemena Durie, am  acting as Location manager for [employer], FNP-C.  I have reviewed the above documentation for accuracy and completeness, and I agree with the above. -  Georgianne Fick, FNP

## 2020-04-07 ENCOUNTER — Encounter (INDEPENDENT_AMBULATORY_CARE_PROVIDER_SITE_OTHER): Payer: Self-pay | Admitting: Family Medicine

## 2020-04-27 ENCOUNTER — Ambulatory Visit (INDEPENDENT_AMBULATORY_CARE_PROVIDER_SITE_OTHER): Payer: No Typology Code available for payment source | Admitting: Family Medicine

## 2020-04-30 ENCOUNTER — Encounter (INDEPENDENT_AMBULATORY_CARE_PROVIDER_SITE_OTHER): Payer: Self-pay | Admitting: Family Medicine

## 2020-04-30 ENCOUNTER — Ambulatory Visit (INDEPENDENT_AMBULATORY_CARE_PROVIDER_SITE_OTHER): Payer: No Typology Code available for payment source | Admitting: Family Medicine

## 2020-04-30 ENCOUNTER — Other Ambulatory Visit (INDEPENDENT_AMBULATORY_CARE_PROVIDER_SITE_OTHER): Payer: Self-pay | Admitting: Family Medicine

## 2020-04-30 ENCOUNTER — Other Ambulatory Visit: Payer: Self-pay

## 2020-04-30 VITALS — BP 129/78 | HR 84 | Temp 98.0°F | Ht 65.0 in | Wt 246.0 lb

## 2020-04-30 DIAGNOSIS — I10 Essential (primary) hypertension: Secondary | ICD-10-CM

## 2020-04-30 DIAGNOSIS — R809 Proteinuria, unspecified: Secondary | ICD-10-CM

## 2020-04-30 DIAGNOSIS — E1159 Type 2 diabetes mellitus with other circulatory complications: Secondary | ICD-10-CM

## 2020-04-30 DIAGNOSIS — I152 Hypertension secondary to endocrine disorders: Secondary | ICD-10-CM

## 2020-04-30 DIAGNOSIS — F3289 Other specified depressive episodes: Secondary | ICD-10-CM | POA: Diagnosis not present

## 2020-04-30 DIAGNOSIS — Z6841 Body Mass Index (BMI) 40.0 and over, adult: Secondary | ICD-10-CM

## 2020-04-30 DIAGNOSIS — E1129 Type 2 diabetes mellitus with other diabetic kidney complication: Secondary | ICD-10-CM

## 2020-04-30 MED ORDER — OZEMPIC (1 MG/DOSE) 4 MG/3ML ~~LOC~~ SOPN: 1.0000 mg | PEN_INJECTOR | SUBCUTANEOUS | 0 refills | Status: DC

## 2020-05-04 ENCOUNTER — Other Ambulatory Visit (HOSPITAL_COMMUNITY): Payer: Self-pay | Admitting: Family Medicine

## 2020-05-05 ENCOUNTER — Encounter (INDEPENDENT_AMBULATORY_CARE_PROVIDER_SITE_OTHER): Payer: Self-pay | Admitting: Family Medicine

## 2020-05-05 NOTE — Progress Notes (Signed)
Chief Complaint:   OBESITY Cathy Ho is here to discuss her progress with her obesity treatment plan along with follow-up of her obesity related diagnoses. Cathy Ho is on the Category 4 Plan and states she is following her eating plan approximately 75% of the time. Cathy Ho states she is not exercising at this time.  Today's visit was #: 11 Starting weight: 284 lbs Starting date: 10/14/2019 Today's weight: 246 lbs Today's date: 04/30/2020 Total lbs lost to date: 38 lbs Total lbs lost since last in-office visit: (+11)  Interim History: Cathy Ho was off plan over Christmas and especially since New Years. She notes she is buying groceries today to restart plan tomorrow. She is up 11 lbs today but ready to get back on plan. She is very upset about her weight gain. Overall she has done exceedingly well on our plan. She would like to switch to [facility] from [place].  Subjective:   1. Type 2 diabetes mellitus with microalbuminuria, without long-term current use of insulin (HCC) Medications reviewed. Diabetic ROS: no hypoglycemia, weight has increased. Cathy Ho is on [place].  DM is well controlled at 5.9. CBGs range from 112-119.  Lab Results  Component Value Date   HGBA1C 5.9 03/11/2020   Lab Results  Component Value Date   MICROALBUR 62.8 03/11/2020   [place] 3 03/11/2020   No results found for: INSULIN  2. Essential hypertension Review: taking medications as instructed, no medication side effects noted, no chest pain on exertion, no dyspnea on exertion, no swelling of ankles. Blood pressure is well controlled with decreased dose of Lisinopril/HCTZ. She is now on 10-12.5 mg. Cathy Ho feels better and has not had any dizzy spells.    BP Readings from Last 3 Encounters:  04/30/20 129/78  04/06/20 114/78  03/23/20 139/82    3. Other depression with emotional eating . Cathy Ho stopped bupropion due to sleep difficulties.    Assessment/Plan:   1. Type 2 diabetes mellitus  with microalbuminuria, without long-term current use of insulin (HCC)   We will refill Ozempic 1.0 mg weekly for 1 month.   2. Essential hypertension  We will watch for signs of hypotension as she continues her lifestyle modifications. Cathy Ho will continue Lisinopril/HCTZ 10-12.5 mg daily.  3. Other depression with emotional eating  Cathy Ho will discontinue bupropion. Continue to monitor.  4. Class 3 severe obesity with serious comorbidity and body mass index (BMI) of 40.0 to 44.9 in adult, unspecified obesity type (HCC)  Cathy Ho is currently in the action stage of change. As such, her goal is to continue with weight loss efforts. She has agreed to the Category 4 Plan.   Mancel Parsons is not available and patient was notified. Offered Saxenda and she would like to wait.  Exercise goals: Cathy Ho will resume treadmill and resistance training.  Behavioral modification strategies: increasing lean protein intake and decreasing simple carbohydrates.  Cathy Ho has agreed to follow-up with our clinic in 3 weeks.   Objective:   Blood pressure 129/78, pulse 84, temperature 98 F (36.7 C), height 5\' 5"  (1.651 m), SpO2 98 %. Body mass index is 39.11 kg/m.  General: Cooperative, alert, well developed, in no acute distress. HEENT: Conjunctivae and lids unremarkable. Cardiovascular: Regular rhythm.  Lungs: Normal work of breathing. Neurologic: No focal deficits.   Lab Results  Component Value Date   NA 26 (A) 03/11/2020   NA 136 (A) 03/11/2020   NA 136 (A) 03/11/2020   K 4.4 03/11/2020   CL 101 03/11/2020   Lab  Results  Component Value Date   ALT 12 03/11/2020   AST 21 03/11/2020   Lab Results  Component Value Date   HGBA1C 5.9 03/11/2020   No results found for: INSULIN No results found for: TSH Lab Results  Component Value Date   CHOL 119 03/11/2020   HDL 29 (A) 03/11/2020   LDLCALC 3 03/11/2020   Lab Results  Component Value Date   WBC 9.7 03/10/2020   HGB 12.3 11/12/2012    No results found for: IRON, TIBC, FERRITIN   Attestation Statements:   Reviewed by clinician on day of visit: allergies, medications, problem list, medical history, surgical history, family history, social history, and previous encounter notes.     IPara March, am acting as Location manager for [employer], [place].  I have reviewed the above documentation for accuracy and completeness, and I agree with the above. -  Georgianne Fick, FNP

## 2020-05-21 ENCOUNTER — Other Ambulatory Visit: Payer: Self-pay

## 2020-05-21 ENCOUNTER — Ambulatory Visit (INDEPENDENT_AMBULATORY_CARE_PROVIDER_SITE_OTHER): Payer: No Typology Code available for payment source | Admitting: Family Medicine

## 2020-05-21 ENCOUNTER — Encounter (INDEPENDENT_AMBULATORY_CARE_PROVIDER_SITE_OTHER): Payer: Self-pay | Admitting: Family Medicine

## 2020-05-21 VITALS — BP 122/74 | HR 99 | Temp 97.9°F | Ht 65.0 in | Wt 238.0 lb

## 2020-05-21 DIAGNOSIS — I152 Hypertension secondary to endocrine disorders: Secondary | ICD-10-CM

## 2020-05-21 DIAGNOSIS — E1159 Type 2 diabetes mellitus with other circulatory complications: Secondary | ICD-10-CM | POA: Diagnosis not present

## 2020-05-21 DIAGNOSIS — E1129 Type 2 diabetes mellitus with other diabetic kidney complication: Secondary | ICD-10-CM | POA: Diagnosis not present

## 2020-05-21 DIAGNOSIS — Z6839 Body mass index (BMI) 39.0-39.9, adult: Secondary | ICD-10-CM

## 2020-05-21 DIAGNOSIS — R809 Proteinuria, unspecified: Secondary | ICD-10-CM | POA: Diagnosis not present

## 2020-05-25 ENCOUNTER — Encounter (INDEPENDENT_AMBULATORY_CARE_PROVIDER_SITE_OTHER): Payer: Self-pay | Admitting: Family Medicine

## 2020-05-25 NOTE — Progress Notes (Signed)
Chief Complaint:   OBESITY Cathy Ho is here to discuss her progress with her obesity treatment plan along with follow-up of her obesity related diagnoses. Eddie is on the Category 4 Plan and states she is following her eating plan approximately 75% of the time. Cathy Ho states she is doing 0 minutes 0 times per week.  Today's visit was #: 12 Starting weight: 284 lbs Starting date: 10/14/2019 Today's weight: 238 lbs Today's date: 05/21/2020 Total lbs lost to date: 46 Total lbs lost since last in-office visit: 8  Interim History: [place] was up 11 lbs at her last office visit, but she has lost 8 lbs since then. She is totally back on the plan, and she feels her hunger is pretty well satisfied. She feels the Ozempic is beneficial for satiety. She is working on drinking more water.  Subjective:   1. Type 2 diabetes mellitus with microalbuminuria, without long-term current use of insulin (Cathy Ho) Cathy Ho's diabetes mellitus is well controlled. Her last A1c was 5.9. DM is managed by her primary care physician. Her 2 hour post prandial ranges between 74 and 107. Her fasting CBGs range between 80's and 90's. She is on Xigduo and Ozempic. Denies hypoglycemia.  Lab Results  Component Value Date   HGBA1C 5.9 03/11/2020   Lab Results  Component Value Date   MICROALBUR 62.8 03/11/2020   [place] 3 03/11/2020   No results found for: INSULIN  2. Hypertension associated with type 2 diabetes mellitus ([place]) Cathy Ho's hypertension is well controlled. She denies chest pain or shortness of breath. Her dose of lisinopril-hydrochlorothiazide was recently reduced to 10-12.5 mg.  BP Readings from Last 3 Encounters:  05/21/20 122/74  04/30/20 129/78  04/06/20 114/78   No results found for: CREATININE  Assessment/Plan:   1. Type 2 diabetes mellitus with microalbuminuria, without long-term current use of insulin (HCC)  [place] will continue all her medications, and could possibly switch to [facility]  if needed for appetite suppression.  2. Hypertension associated with type 2 diabetes mellitus ([place]) Cathy Ho will continue lisinopril-hydrochlorothiazide 10-12.5 mg q daily.   3. Class 2 severe obesity with serious comorbidity and body mass index (BMI) of 39.0 to 39.9 in adult, unspecified obesity type (HCC) Cathy Ho is currently in the action stage of change. As such, her goal is to continue with weight loss efforts. She has agreed to the Category 4 Plan.   Exercise goals: Look for forms of exercise to do.  Exercise 2 times per week for 20 minutes.  Behavioral modification strategies: increasing lean protein intake and decreasing simple carbohydrates.  Cathy Ho has agreed to follow-up with our clinic in 3 weeks.  Objective:   Blood pressure 122/74, pulse 99, temperature 97.9 F (36.6 C), height 5\' 5"  (1.651 m), weight 238 lb (108 kg), SpO2 99 %. Body mass index is 39.61 kg/m.  General: Cooperative, alert, well developed, in no acute distress. HEENT: Conjunctivae and lids unremarkable. Cardiovascular: Regular rhythm.  Lungs: Normal work of breathing. Neurologic: No focal deficits.   Lab Results  Component Value Date   NA 26 (A) 03/11/2020   NA 136 (A) 03/11/2020   NA 136 (A) 03/11/2020   K 4.4 03/11/2020   CL 101 03/11/2020   Lab Results  Component Value Date   ALT 12 03/11/2020   AST 21 03/11/2020   Lab Results  Component Value Date   HGBA1C 5.9 03/11/2020   No results found for: INSULIN No results found for: TSH Lab Results  Component Value Date  CHOL 119 03/11/2020   HDL 29 (A) 03/11/2020   LDLCALC 3 03/11/2020   Lab Results  Component Value Date   WBC 9.7 03/10/2020   HGB 12.3 11/12/2012   No results found for: IRON, TIBC, FERRITIN   Attestation Statements:   Reviewed by clinician on day of visit: allergies, medications, problem list, medical history, surgical history, family history, social history, and previous encounter notes.   Cathy Ho,  am acting as Location manager for [employer], FNP-C.  I have reviewed the above documentation for accuracy and completeness, and I agree with the above. -  Cathy Fick, FNP

## 2020-06-11 ENCOUNTER — Other Ambulatory Visit: Payer: Self-pay

## 2020-06-11 ENCOUNTER — Encounter (INDEPENDENT_AMBULATORY_CARE_PROVIDER_SITE_OTHER): Payer: Self-pay | Admitting: Family Medicine

## 2020-06-11 ENCOUNTER — Ambulatory Visit (INDEPENDENT_AMBULATORY_CARE_PROVIDER_SITE_OTHER): Payer: No Typology Code available for payment source | Admitting: Family Medicine

## 2020-06-11 VITALS — BP 115/76 | HR 101 | Temp 98.6°F | Ht 65.0 in | Wt 235.0 lb

## 2020-06-11 DIAGNOSIS — E1129 Type 2 diabetes mellitus with other diabetic kidney complication: Secondary | ICD-10-CM

## 2020-06-11 DIAGNOSIS — Z6839 Body mass index (BMI) 39.0-39.9, adult: Secondary | ICD-10-CM | POA: Diagnosis not present

## 2020-06-11 DIAGNOSIS — R809 Proteinuria, unspecified: Secondary | ICD-10-CM

## 2020-06-15 ENCOUNTER — Encounter (INDEPENDENT_AMBULATORY_CARE_PROVIDER_SITE_OTHER): Payer: Self-pay | Admitting: Family Medicine

## 2020-06-15 NOTE — Progress Notes (Signed)
Chief Complaint:   OBESITY Cathy Ho is here to discuss her progress with her obesity treatment plan along with follow-up of her obesity related diagnoses. Cathy Ho is on the Category 4 Plan and states she is following her eating plan approximately 70-75% of the time. Cathy Ho states she is doing 0 minutes 0 times per week.  Today's visit was #: 32 Starting weight: 284 lbs Starting date: 10/14/2019 Today's weight: 235 lbs Today's date: 06/11/2020 Total lbs lost to date: 49 lbs Total lbs lost since last in-office visit: 3 lbs  Interim History: Cathy Ho continues to do well with weight loss. She si down 49 lbs overall despite a gain of 13 lbs over Christmas. She has lost that weight over the last two OVs.  She still skips breakfast.  Her hunger is satisfied. She is working on increasing her water intake.  Subjective:   1. Type 2 diabetes mellitus with microalbuminuria, without long-term current use of insulin ([place]) Cathy Ho's diabetes is well controlled. Her last A1c was 5.9. She is on Ozempic 1 mg and Xigduo. Her fasting blood glucose runs 76-98 and 2 .hours post prandial 78-92. She denies constipation,  Lab Results  Component Value Date   HGBA1C 5.9 03/11/2020   Lab Results  Component Value Date   MICROALBUR 62.8 03/11/2020   Cathy Ho 3 03/11/2020   No results found for: INSULIN  Assessment/Plan:   1. Type 2 diabetes mellitus with microalbuminuria, without long-term current use of  Continue Ozempic and Xigduo.  2. Class 2 severe obesity with serious comorbidity and body mass index (BMI) of 39.0 to 39.9 in adult, unspecified obesity type (HCC) Cathy Ho is currently in the action stage of change. As such, her goal is to continue with weight loss efforts. She has agreed to the Category 4 Plan.   Exercise goals: All adults should avoid inactivity. Some physical activity is better than none, and adults who participate in any amount of physical activity gain some health  benefits.  Behavioral modification strategies: increasing water intake and no skipping meals.  Cathy Ho has agreed to follow-up with our clinic in 3 weeks.   Objective:   Blood pressure 115/76, pulse (!) 101, temperature 98.6 F (37 C), height 5\' 5"  (1.651 m), weight 235 lb (106.6 kg), SpO2 100 %. Body mass index is 39.11 kg/m.  General: Cooperative, alert, well developed, in no acute distress. HEENT: Conjunctivae and lids unremarkable. Cardiovascular: Regular rhythm.  Lungs: Normal work of breathing. Neurologic: No focal deficits.   Lab Results  Component Value Date   NA 26 (A) 03/11/2020   NA 136 (A) 03/11/2020   NA 136 (A) 03/11/2020   K 4.4 03/11/2020   CL 101 03/11/2020   Lab Results  Component Value Date   ALT 12 03/11/2020   AST 21 03/11/2020   Lab Results  Component Value Date   HGBA1C 5.9 03/11/2020   No results found for: INSULIN No results found for: TSH Lab Results  Component Value Date   CHOL 119 03/11/2020   HDL 29 (A) 03/11/2020   LDLCALC 3 03/11/2020   Lab Results  Component Value Date   WBC 9.7 03/10/2020   HGB 12.3 11/12/2012    Attestation Statements:   Reviewed by clinician on day of visit: allergies, medications, problem list, medical history, surgical history, family history, social history, and previous encounter notes.  Coral Ceo, am acting as Location manager for [employer], [employer].  I have reviewed the above documentation for accuracy and completeness, and I  agree with the above. -  Georgianne Fick, FNP

## 2020-07-02 ENCOUNTER — Ambulatory Visit (INDEPENDENT_AMBULATORY_CARE_PROVIDER_SITE_OTHER): Payer: No Typology Code available for payment source | Admitting: Family Medicine

## 2020-07-08 ENCOUNTER — Ambulatory Visit (INDEPENDENT_AMBULATORY_CARE_PROVIDER_SITE_OTHER): Payer: No Typology Code available for payment source | Admitting: Obstetrics & Gynecology

## 2020-07-08 ENCOUNTER — Other Ambulatory Visit (HOSPITAL_COMMUNITY)
Admission: RE | Admit: 2020-07-08 | Discharge: 2020-07-08 | Disposition: A | Discharge status: Home / Self Care | Payer: No Typology Code available for payment source | Source: Ambulatory Visit | Attending: Obstetrics & Gynecology | Admitting: Obstetrics & Gynecology

## 2020-07-08 ENCOUNTER — Encounter: Payer: Self-pay | Admitting: Obstetrics & Gynecology

## 2020-07-08 ENCOUNTER — Other Ambulatory Visit: Payer: Self-pay | Admitting: Obstetrics & Gynecology

## 2020-07-08 ENCOUNTER — Other Ambulatory Visit: Payer: Self-pay

## 2020-07-08 VITALS — BP 128/80 | HR 80 | Ht 64.0 in | Wt 245.8 lb

## 2020-07-08 DIAGNOSIS — N920 Excessive and frequent menstruation with regular cycle: Secondary | ICD-10-CM | POA: Diagnosis not present

## 2020-07-08 DIAGNOSIS — Z01419 Encounter for gynecological examination (general) (routine) without abnormal findings: Secondary | ICD-10-CM

## 2020-07-08 DIAGNOSIS — N812 Incomplete uterovaginal prolapse: Secondary | ICD-10-CM

## 2020-07-08 DIAGNOSIS — Z113 Encounter for screening for infections with a predominantly sexual mode of transmission: Secondary | ICD-10-CM | POA: Diagnosis not present

## 2020-07-08 DIAGNOSIS — Z8742 Personal history of other diseases of the female genital tract: Secondary | ICD-10-CM

## 2020-07-08 DIAGNOSIS — N8502 Endometrial intraepithelial neoplasia [EIN]: Secondary | ICD-10-CM | POA: Insufficient documentation

## 2020-07-08 MED ORDER — TRANEXAMIC ACID 650 MG PO TABS: 1300.0000 mg | ORAL_TABLET | Freq: Three times a day (TID) | ORAL | 2 refills | Status: DC

## 2020-07-08 MED ORDER — NAPROXEN SODIUM 550 MG PO TABS: 550.0000 mg | ORAL_TABLET | Freq: Two times a day (BID) | ORAL | 2 refills | Status: DC

## 2020-07-08 NOTE — Progress Notes (Signed)
GYNECOLOGY ANNUAL PREVENTATIVE CARE ENCOUNTER NOTE  History:     Cathy Ho is a 40 y.o. G0P0 female here for a routine annual gynecologic exam and to establish care.  Current complaints: long history of occasional heavy menstrual periods.  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  Had Hysteroscopy, D&C and multiple endometrial biopsies since then, recent biopsies have been negative. Not on any therapy currently.  Reports that she kept expelling the IUDs, and does not want progesterone oral therapy for now given risk of appetite stimulation and weight gain.  Reports that the heavy bleeding does not happen every period, just once in a while and desires episodic therapy for this.  Also reports having uterine prolapse; this is associated with pelvic pressure, incomplete bladder emptying and pain during intercourse.   Denies current abnormal vaginal discharge, pelvic pain, other problems with intercourse or other gynecologic concerns.    Gynecologic History Patient's last menstrual period was 06/26/2020 (exact date). Contraception: none Last Pap: 2019. Results were: normal with negative HPV Last mammogram: 02/17/2020. Results were: normal  Obstetric History OB History  Gravida Para Term Preterm AB Living  0            SAB IAB Ectopic Multiple Live Births               Past Medical History:  Diagnosis Date  . Anemia   . Back pain   . Chest pain   . Diabetes mellitus without complication ([place]) 5035   diet and exercise only  . Fibroids 2010  . Hypertension   . Joint pain   . Low iron   . PCOS (polycystic ovarian syndrome)   . Prolapsed uterus 2010  . Sleep apnea    cpap, pt does not know settings  . Vitamin D deficiency     Past Surgical History:  Procedure Laterality Date  . carbuckle cyst in back removed  02/2005  . COLONOSCOPY N/A 08/15/2013    Procedure: COLONOSCOPY;  Surgeon: Inda Castle, MD;  Location: [facility] END[facility]COPY;  Service: Endoscopy;  Laterality: N/A;  . DILATION AND CURETTAGE OF UTERUS  2011   Endometrial hyperplasia with atypia, submucosal fibroid  . HYSTEROSCOPY  2011  . [place] EXTRACTION  1998    Current Outpatient Medications on File Prior to Visit  Medication Sig Dispense Refill  . Cholecalciferol (VITAMIN D) 50 MCG (2000 UT) CAPS Take by mouth daily.    . clindamycin (CLEOCIN T) 1 % external solution Apply topically 2 (two) times daily.    . Dapagliflozin-metFORMIN HCl ER (XIGDUO XR) 08-998 MG TB24 Take by mouth.    . ferrous sulfate 325 (65 FE) MG tablet Take 325 mg by mouth daily with breakfast. prn    . lisinopril-hydrochlorothiazide (ZESTORETIC) 10-12.5 MG tablet Take 1 tablet by mouth daily. 90 tablet 0  . Semaglutide, 1 MG/DOSE, (OZEMPIC, 1 MG/DOSE,) 4 MG/3ML SOPN Inject 1 mg into the skin once a week. 3 mL 0  . Vitamin D, Ergocalciferol, (DRISDOL) 1.25 MG (50000 UNIT) CAPS capsule Take 1 capsule (50,000 Units total) by mouth every 7 (seven) days. 4 capsule 0   No current facility-administered medications on file prior to visit.    Not on File  Social History:  reports that she has never smoked. She has never used smokeless tobacco. She reports current alcohol use. She reports that she does not use drugs.  Family History  Problem Relation Age of Onset  . Hypertension Mother   . Diabetes Mother   . Obesity Mother   . Hypertension Father   . Diabetes Father   . Sleep apnea Father   . Obesity Father   . Diabetes Maternal Uncle   . Hypertension Maternal Grandfather   . Heart disease Maternal Grandfather   . Diabetes Maternal Grandfather   . Diabetes Paternal Grandmother   . Heart disease Paternal Grandmother   . Seizures Sister   . Stroke Maternal Aunt   . Diabetes Maternal Aunt   . Hypertension Maternal Aunt   . Colon cancer Neg Hx   . Liver disease Neg Hx   . Kidney disease Neg Hx      The following portions of the patient's history were reviewed and updated as appropriate: allergies, current medications, past family history, past medical history, past social history, past surgical history and problem list.  Review of Systems Pertinent items noted in HPI and remainder of comprehensive ROS otherwise negative.  Physical Exam:  BP 128/80   Pulse 80   Ht 5\' 4"  (1.626 m)   Wt 245 lb 12.8 oz (111.5 kg)   LMP 06/26/2020 (Exact Date)   BMI 42.19 kg/m  CONSTITUTIONAL: Well-developed, well-nourished female in no acute distress.  HENT:  Normocephalic, atraumatic, External right and left ear normal.  EYES: Conjunctivae and EOM are normal. Pupils are equal, round, and reactive to light. No scleral icterus.  NECK: Normal range of motion, supple, no masses.  Normal thyroid.  MUSCULOSKELETAL: Normal range of motion. No tenderness.  No cyanosis, clubbing, or edema.  NEUROLOGIC: Alert and oriented to person, place, and time. Normal reflexes, muscle tone coordination. No cranial nerve deficit noted. PSYCHIATRIC: Normal mood and affect. Normal behavior. Normal judgment and thought content. CARDIOVASCULAR: Normal heart rate noted, regular rhythm RESPIRATORY: Clear to auscultation bilaterally. Effort and breath sounds normal, no problems with respiration noted. BREASTS: Symmetric in size. No masses, tenderness, skin changes, nipple drainage, or lymphadenopathy bilaterally. Healed hidradenitis suppurativa lesions under breasts and axilla.  Performed in the presence of a chaperone.  ABDOMEN: Soft, obese, no distention appreciated.  No tenderness, rebound or guarding.  PELVIC: Normal appearing external genitalia and urethral meatus. Cervix visible at introitus, significant pelvic organ prolapse noted. Normal appearing vaginal mucosa.  Normal appearing discharge.  Pap smear obtained, significant bleeding after pap ameliorated with silver nitrate application.  No uterine tenderness, unable to  palpate adnexa.  Performed in the presence of a chaperone.    Assessment and Plan:      1. Menorrhagia with regular cycle 2. History of complex endometrial hyperplasia with atypia Discussed episodic treatment with Lysteda and Naproxen. Will get ultrasound for evaluation, emphasized likely need for repeat biopsy given her history. Will follow up results and manage accordingly. - tranexamic acid (LYSTEDA) 650 MG TABS tablet; Take 2 tablets (1,300 mg total) by mouth 3 (three) times daily. Take during menses for a maximum of five days  Dispense: 30 tablet; Refill: 2 - naproxen sodium (ANAPROX DS) 550 MG tablet; Take 1 tablet (550 mg total) by mouth 2 (two) times daily with a meal. As needed for pain and as instructed around periods  Dispense: 30 tablet; Refill: 2 - US PELVIC COMPLETE WITH TRANSVAGINAL; Future  3. Second degree uterine prolapse Referred to Urogynecology for further evaluation of pelvic organ prolapse and associated symptoms. - Ambulatory referral to Urogynecology  4. Screening for STD (sexually transmitted disease) Patient desires this screening. -  Cytology - PAP( [place]) - HIV Antibody (routine testing w rflx) - RPR - Hepatitis B Surface AntiGEN - Hepatitis C Antibody  5. Well woman exam with routine gynecological exam - Cytology - PAP( [place]) Will follow up results of pap smear and manage accordingly. Mammogram is up to date Routine preventative health maintenance measures emphasized. Please refer to After Visit Summary for other counseling recommendations.    I spent 30 minutes dedicated to the care of this patient including pre-visit review of records, face to face time with the patient discussing conditions #1-3 above and treatments and post visit ordering of testing.      Verita Schneiders, MD, [place] for [employer], [place]

## 2020-07-08 NOTE — Patient Instructions (Signed)
Preventive Care 21-42 Years Old, Female Preventive care refers to lifestyle choices and visits with your health care provider that can promote health and wellness. This includes:  A yearly physical exam. This is also called an annual wellness visit.  Regular dental and eye exams.  Immunizations.  Screening for certain conditions.  Healthy lifestyle choices, such as: ? Eating a healthy diet. ? Getting regular exercise. ? Not using drugs or products that contain nicotine and tobacco. ? Limiting alcohol use. What can I expect for my preventive care visit? Physical exam Your health care provider may check your:  Height and weight. These may be used to calculate your BMI (body mass index). BMI is a measurement that tells if you are at a healthy weight.  Heart rate and blood pressure.  Body temperature.  Skin for abnormal spots. Counseling Your health care provider may ask you questions about your:  Past medical problems.  Family's medical history.  Alcohol, tobacco, and drug use.  Emotional well-being.  Home life and relationship well-being.  Sexual activity.  Diet, exercise, and sleep habits.  Work and work environment.  Access to firearms.  Method of birth control.  Menstrual cycle.  Pregnancy history. What immunizations do I need? Vaccines are usually given at various ages, according to a schedule. Your health care provider will recommend vaccines for you based on your age, medical history, and lifestyle or other factors, such as travel or where you work.   What tests do I need? Blood tests  Lipid and cholesterol levels. These may be checked every 5 years starting at age 20.  Hepatitis C test.  Hepatitis B test. Screening  Diabetes screening. This is done by checking your blood sugar (glucose) after you have not eaten for a while (fasting).  STD (sexually transmitted disease) testing, if you are at risk.  BRCA-related cancer screening. This may be  done if you have a family history of breast, ovarian, tubal, or peritoneal cancers.  Pelvic exam and Pap test. This may be done every 3 years starting at age 21. Starting at age 30, this may be done every 5 years if you have a Pap test in combination with an HPV test. Talk with your health care provider about your test results, treatment options, and if necessary, the need for more tests.   Follow these instructions at home: Eating and drinking  Eat a healthy diet that includes fresh fruits and vegetables, whole grains, lean protein, and low-fat dairy products.  Take vitamin and mineral supplements as recommended by your health care provider.  Do not drink alcohol if: ? Your health care provider tells you not to drink. ? You are pregnant, may be pregnant, or are planning to become pregnant.  If you drink alcohol: ? Limit how much you have to 0-1 drink a day. ? Be aware of how much alcohol is in your drink. In the [place] one drink equals one 12 oz bottle of beer (355 mL), one 5 oz glass of wine (148 mL), or one 1 oz glass of hard liquor (44 mL).   Lifestyle  Take daily care of your teeth and gums. Brush your teeth every morning and night with fluoride toothpaste. Floss one time each day.  Stay active. Exercise for at least 30 minutes 5 or more days each week.  Do not use any products that contain nicotine or tobacco, such as cigarettes, e-cigarettes, and chewing tobacco. If you need help quitting, ask your health care provider.  Do not   use drugs.  If you are sexually active, practice safe sex. Use a condom or other form of protection to prevent STIs (sexually transmitted infections).  If you do not wish to become pregnant, use a form of birth control. If you plan to become pregnant, see your health care provider for a prepregnancy visit.  Find healthy ways to cope with stress, such as: ? Meditation, yoga, or listening to music. ? Journaling. ? Talking to a trusted  person. ? Spending time with friends and family. Safety  Always wear your seat belt while driving or riding in a vehicle.  Do not drive: ? If you have been drinking alcohol. Do not ride with someone who has been drinking. ? When you are tired or distracted. ? While texting.  Wear a helmet and other protective equipment during sports activities.  If you have firearms in your house, make sure you follow all gun safety procedures.  Seek help if you have been physically or sexually abused. What's next?  Go to your health care provider once a year for an annual wellness visit.  Ask your health care provider how often you should have your eyes and teeth checked.  Stay up to date on all vaccines. This information is not intended to replace advice given to you by your health care provider. Make sure you discuss any questions you have with your health care provider. Document Revised: 12/08/2019 Document Reviewed: 12/21/2017 Elsevier Patient Education  2021 Elsevier Inc.  

## 2020-07-09 ENCOUNTER — Other Ambulatory Visit: Payer: Self-pay

## 2020-07-09 ENCOUNTER — Other Ambulatory Visit (INDEPENDENT_AMBULATORY_CARE_PROVIDER_SITE_OTHER): Payer: Self-pay | Admitting: Family Medicine

## 2020-07-09 ENCOUNTER — Encounter (INDEPENDENT_AMBULATORY_CARE_PROVIDER_SITE_OTHER): Payer: Self-pay | Admitting: Family Medicine

## 2020-07-09 ENCOUNTER — Telehealth (INDEPENDENT_AMBULATORY_CARE_PROVIDER_SITE_OTHER): Payer: Self-pay

## 2020-07-09 ENCOUNTER — Ambulatory Visit (INDEPENDENT_AMBULATORY_CARE_PROVIDER_SITE_OTHER): Payer: No Typology Code available for payment source | Admitting: Family Medicine

## 2020-07-09 VITALS — BP 122/76 | HR 81 | Temp 98.3°F | Ht 65.0 in | Wt 239.0 lb

## 2020-07-09 DIAGNOSIS — E1129 Type 2 diabetes mellitus with other diabetic kidney complication: Secondary | ICD-10-CM

## 2020-07-09 DIAGNOSIS — E1159 Type 2 diabetes mellitus with other circulatory complications: Secondary | ICD-10-CM | POA: Diagnosis not present

## 2020-07-09 DIAGNOSIS — E559 Vitamin D deficiency, unspecified: Secondary | ICD-10-CM | POA: Diagnosis not present

## 2020-07-09 DIAGNOSIS — Z9189 Other specified personal risk factors, not elsewhere classified: Secondary | ICD-10-CM

## 2020-07-09 DIAGNOSIS — R809 Proteinuria, unspecified: Secondary | ICD-10-CM

## 2020-07-09 DIAGNOSIS — I152 Hypertension secondary to endocrine disorders: Secondary | ICD-10-CM

## 2020-07-09 DIAGNOSIS — Z6839 Body mass index (BMI) 39.0-39.9, adult: Secondary | ICD-10-CM

## 2020-07-09 LAB — CYTOLOGY - PAP
Chlamydia: NEGATIVE
Comment: NEGATIVE
Comment: NEGATIVE
Comment: NEGATIVE
Comment: NORMAL
Diagnosis: NEGATIVE
High risk HPV: NEGATIVE
Neisseria Gonorrhea: NEGATIVE
Trichomonas: NEGATIVE

## 2020-07-09 LAB — HEPATITIS C ANTIBODY: Hep C Virus Ab: <0.1 s/co ratio (ref 0.0–0.9)

## 2020-07-09 LAB — RPR: RPR Ser Ql: NONREACTIVE

## 2020-07-09 LAB — HIV ANTIBODY (ROUTINE TESTING W REFLEX): HIV Screen 4th Generation wRfx: NONREACTIVE

## 2020-07-09 LAB — HEPATITIS B SURFACE ANTIGEN: Hepatitis B Surface Ag: NEGATIVE

## 2020-07-09 MED ORDER — LISINOPRIL-HYDROCHLOROTHIAZIDE 10-12.5 MG PO TABS: 1.0000 | ORAL_TABLET | Freq: Every day | ORAL | 0 refills | Status: DC

## 2020-07-09 MED ORDER — SM VITAMIN D3 100 MCG (4000 UT) PO CAPS: 1.0000 | ORAL_CAPSULE | Freq: Every day | ORAL | 0 refills | Status: DC

## 2020-07-09 MED ORDER — WEGOVY 1.7 MG/0.75ML ~~LOC~~ SOAJ: 1.7000 mg | SUBCUTANEOUS | 0 refills | Status: DC

## 2020-07-09 NOTE — Telephone Encounter (Signed)
Prior Josem Kaufmann has been started for [facility], via cover my meds Waiting for determination.

## 2020-07-13 ENCOUNTER — Encounter (INDEPENDENT_AMBULATORY_CARE_PROVIDER_SITE_OTHER): Payer: Self-pay | Admitting: Family Medicine

## 2020-07-13 NOTE — Progress Notes (Signed)
Chief Complaint:   OBESITY Cathy Ho is here to discuss her progress with her obesity treatment plan along with follow-up of her obesity related diagnoses. Cathy Ho is on the Category 4 Plan and states she is following her eating plan approximately 65% of the time. 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  Today's visit was #: 14 Starting weight: 284 lbs Starting date: 10/14/2019 Today's weight: 239 lbs Today's date: 07/09/2020 Total lbs lost to date: 45 lbs Total lbs lost since last in-office visit: +4  Interim History: Cathy Ho is struggling with some stress eating and hunger. She is in school and also works full-time. She has bought some snacks at the grocery store that she shouldn't have and has been overeating them.  Subjective:   1. Hypertension associated with type 2 diabetes mellitus ([place]) Solash's BP is well controlled. She has microalbuminuria. She is on lisinopril/HCTZ.  BP Readings from Last 3 Encounters:  07/09/20 122/76  07/08/20 128/80  06/11/20 115/76    2. Type 2 diabetes mellitus with microalbuminuria, without long-term current use of insulin ([place]) Cathy Ho's diabetes is well controlled. She is on Xigduo and Ozempic. Her CBG runs 87-*90 and 2 hours postprandial 86. Her PCP wants to wait on starting statin therapy.  Lab Results  Component Value Date   HGBA1C 5.9 03/11/2020   Lab Results  Component Value Date   MICROALBUR 62.8 03/11/2020   [place] 3 03/11/2020   No results found for: INSULIN  3. Vitamin D deficiency Cathy Ho's Vitamin D level was 62.5 on 03/11/2020. She is currently taking OTC vitamin D 4,000 units each day.    Ref. Range 03/11/2020 00:00  Vitamin D, 25-Hydroxy Unknown 62.5   4. At risk for deficient knowledge of diabetes mellitus Cathy Ho is at risk for deficient knowledge of diabetes mellitus.  Assessment/Plan:   1. Hypertension associated with type 2 diabetes mellitus ([place]) Continue  meds.  - lisinopril-hydrochlorothiazide (ZESTORETIC) 10-12.5 MG tablet; Take 1 tablet by mouth daily.  Dispense: 90 tablet; Refill: 0  2. Type 2 diabetes mellitus with microalbuminuria, without long-term current use of insulin ([place]) . Continue Ozempic 1 mg until [facility] prior authorization is obtained. Continue Xigduo.  3. Vitamin D deficiency Low Vitamin D level contributes to fatigue and are associated with obesity, breast, and colon cancer. She agrees to continue to take OTC Vitamin D @4 ,000 IU daily and will follow-up for routine testing of Vitamin D, at least 2-3 times per year to avoid over-replacement.  - Cholecalciferol (SM VITAMIN D3) 100 MCG (4000 UT) CAPS; Take 1 capsule (4,000 Units total) by mouth daily.  Dispense: 30 capsule; Refill: 0  4. At risk for deficient knowledge of diabetes mellitus Cathy Ho was given approximately 15 minutes of diabetes education and counseling today. We discussed intensive lifestyle modifications today with an emphasis on weight loss as well as increasing exercise and decreasing simple carbohydrates in her diet. We also reviewed medication options with an emphasis on risk versus benefit of those discussed.   5. Class 2 severe obesity with serious comorbidity and body mass index (BMI) of 39.0 to 39.9 in adult, unspecified obesity type (H[facility]) Cathy Ho is currently in the action stage of change. As such, her goal is to continue with weight loss efforts. She has agreed to the Category 4 Plan.   We discussed various medication options to help [facility] with her weight loss efforts and we both agreed to start [facility], pending PA. - Semaglutide-Weight  Management (WEGOVY) 1.7 MG/0.75ML SOAJ; Inject 1.7 mg into the skin once a week.  Dispense: 3 mL; Refill: 0  Exercise goals: As is  Behavioral modification strategies: decreasing simple carbohydrates and better snacking choices.  Cathy Ho has agreed to follow-up with our clinic in 3 weeks.   Objective:   Blood  pressure 122/76, pulse 81, temperature 98.3 F (36.8 C), height 5\' 5"  (1.651 m), weight 239 lb (108.4 kg), last menstrual period 06/26/2020, SpO2 99 %. Body mass index is 39.77 kg/m.  General: Cooperative, alert, well developed, in no acute distress. HEENT: Conjunctivae and lids unremarkable. Cardiovascular: Regular rhythm.  Lungs: Normal work of breathing. Neurologic: No focal deficits.   Lab Results  Component Value Date   NA 26 (A) 03/11/2020   NA 136 (A) 03/11/2020   NA 136 (A) 03/11/2020   K 4.4 03/11/2020   CL 101 03/11/2020   Lab Results  Component Value Date   ALT 12 03/11/2020   AST 21 03/11/2020   Lab Results  Component Value Date   HGBA1C 5.9 03/11/2020   No results found for: INSULIN No results found for: TSH Lab Results  Component Value Date   CHOL 119 03/11/2020   HDL 29 (A) 03/11/2020   LDLCALC 3 03/11/2020   Lab Results  Component Value Date   WBC 9.7 03/10/2020   HGB 12.3 11/12/2012    Attestation Statements:   Reviewed by clinician on day of visit: allergies, medications, problem list, medical history, surgical history, family history, social history, and previous encounter notes.  Coral Ceo, am acting as Location manager for [employer], [place].  I have reviewed the above documentation for accuracy and completeness, and I agree with the above. -  Georgianne Fick, FNP

## 2020-07-15 ENCOUNTER — Encounter (INDEPENDENT_AMBULATORY_CARE_PROVIDER_SITE_OTHER): Payer: Self-pay

## 2020-07-28 ENCOUNTER — Ambulatory Visit
Admission: RE | Admit: 2020-07-28 | Discharge: 2020-07-28 | Disposition: A | Discharge status: Home / Self Care | Payer: No Typology Code available for payment source | Source: Ambulatory Visit | Attending: Obstetrics & Gynecology | Admitting: Obstetrics & Gynecology

## 2020-07-28 ENCOUNTER — Other Ambulatory Visit: Payer: Self-pay

## 2020-07-28 DIAGNOSIS — Z8742 Personal history of other diseases of the female genital tract: Secondary | ICD-10-CM | POA: Diagnosis present

## 2020-07-28 DIAGNOSIS — N920 Excessive and frequent menstruation with regular cycle: Secondary | ICD-10-CM

## 2020-07-29 ENCOUNTER — Encounter (INDEPENDENT_AMBULATORY_CARE_PROVIDER_SITE_OTHER): Payer: Self-pay

## 2020-07-29 ENCOUNTER — Ambulatory Visit (INDEPENDENT_AMBULATORY_CARE_PROVIDER_SITE_OTHER): Payer: No Typology Code available for payment source | Admitting: Family Medicine

## 2020-07-30 ENCOUNTER — Telehealth: Payer: Self-pay

## 2020-07-30 NOTE — Telephone Encounter (Addendum)
-----   Message from Osborne Oman, MD sent at 07/29/2020  5:08 PM EDT ----- Patient needs biopsy of endometrium for further evaluation given history of complex endometrial hyperplasia. Needs appt to discuss this and further management. Please schedule with me or another surgeon.  Please call patient with results and recommendations   Called pt with results and provider recommendation. Pt reports unprotected intercourse yesterday. I explained we will schedule pt for appt at least 2 weeks away. Explained she will need to avoid unprotected intercourse for 2 weeks prior to this procedure. Reviewed endometrial biopsy procedure briefly. Pt agreeable to plan. [place] office notified to schedule.

## 2020-08-03 ENCOUNTER — Other Ambulatory Visit (INDEPENDENT_AMBULATORY_CARE_PROVIDER_SITE_OTHER): Payer: Self-pay | Admitting: Family Medicine

## 2020-08-04 NOTE — Telephone Encounter (Signed)
Last seen by Dawn  

## 2020-08-05 ENCOUNTER — Other Ambulatory Visit (HOSPITAL_COMMUNITY): Payer: Self-pay

## 2020-08-05 MED ORDER — WEGOVY 1.7 MG/0.75ML ~~LOC~~ SOAJ
1.7000 mg | SUBCUTANEOUS | 0 refills | Status: DC
  Filled 2020-08-05: qty 3, 28d supply, fill #0

## 2020-08-05 NOTE — Telephone Encounter (Signed)
Please review

## 2020-08-05 NOTE — Telephone Encounter (Signed)
Refill request

## 2020-08-06 ENCOUNTER — Other Ambulatory Visit (HOSPITAL_COMMUNITY): Payer: Self-pay

## 2020-08-06 MED ORDER — XIGDUO XR 5-1000 MG PO TB24
2.0000 | ORAL_TABLET | Freq: Every morning | ORAL | 0 refills | Status: DC
  Filled 2020-08-06: qty 180, 90d supply, fill #0

## 2020-08-07 ENCOUNTER — Other Ambulatory Visit (HOSPITAL_COMMUNITY): Payer: Self-pay

## 2020-08-07 MED ORDER — CARESTART COVID-19 HOME TEST VI KIT
PACK | 0 refills | Status: DC
  Filled 2020-08-07: qty 4, 4d supply, fill #0

## 2020-08-10 ENCOUNTER — Telehealth: Payer: Self-pay

## 2020-08-10 NOTE — Progress Notes (Signed)
[facility] New Patient Evaluation and Consultation  Referring Provider: Osborne Oman, MD PCP: [name], DO Date of Service: 08/11/2020  SUBJECTIVE Chief Complaint: New Patient (Initial Visit) (Referral for prolapse and incomplete bladder emptying)  History of Present Illness: Cathy Ho is a 40 y.o. Black or African-American female seen in consultation at the request of Dr. Harolyn Ho for evaluation of prolapse.    Review of records from Dr Harolyn Ho significant for: Has uterine prolapse associated with pelvic pressure and incomplete bladder emptying. Also has pain with intercourse. Cervix was visible at introitus.   Urinary Symptoms: Leaks urine with with a full bladder and with urgency Leaks 1 time(s) per day.  Pad use: 4 liners/ mini-pads per day.   She is not bothered by her UI symptoms.  Day time voids 5.  Nocturia: 1 times per night to void. Voiding dysfunction: she does not empty her bladder well.  does not use a catheter to empty bladder.  When urinating, she feels she has no difficulties  UTIs: 0 UTI's in the last year.   Denies history of blood in urine and kidney or bladder stones  Pelvic Organ Prolapse Symptoms:                  Has noticed a bulge in the vagina but it is not that bothersome. Unsure if this is the cause of some of her symptoms of feeling her bladder not emptying.   Bowel Symptom: Bowel movements: 2 time(s) per day Stool consistency: soft  or loose Straining: no.  Splinting: no.  Incomplete evacuation: no.  She Denies accidental bowel leakage / fecal incontinence Bowel regimen: stool softener Last colonoscopy: Date 07-2013, Results normal  Sexual Function Sexually active: yes.  Sexual orientation: heterosexual Pain with sex: Yes, deep in the pelvis, has discomfort due to prolapse, pressure on lower abdomen Missionary position is more painful, others less so  Pelvic Pain Denies pelvic pain Has pain with menstrual  cycle. Using Lysteda for heavy menstruation   Past Medical History:  Past Medical History:  Diagnosis Date  . Anemia   . Back pain   . Chest pain   . Diabetes mellitus without complication ([place]) 0973   diet and exercise only  . Fibroids 2010  . Hypertension   . Joint pain   . Low iron   . PCOS (polycystic ovarian syndrome)   . Prolapsed uterus 2010  . Sleep apnea    cpap, pt does not know settings  . Vitamin D deficiency      Past Surgical History:   Past Surgical History:  Procedure Laterality Date  . carbuckle cyst in back removed  02/2005  . COLONOSCOPY N/A 08/15/2013   Procedure: COLONOSCOPY;  Surgeon: Inda Castle, MD;  Location: [facility];  Service: Endoscopy;  Laterality: N/A;  . DILATION AND CURETTAGE OF UTERUS  2011   Endometrial hyperplasia with atypia, submucosal fibroid  . HYSTEROSCOPY  2011  . [place]     Past OB/GYN History: G0 P0 Menopausal: No, LMP Patient's last menstrual period was 07/23/2020 (exact date). Contraception: none. Last pap smear was 07-08-20- negative.  Any history of abnormal pap smears: no.   Medications: She has a current medication list which includes the following prescription(s): sm vitamin d3, clindamycin, xigduo xr, dapagliflozin-metformin hcl er, ferrous sulfate, lisinopril-hydrochlorothiazide, naproxen sodium, wegovy, tranexamic acid, fluconazole, and [DISCONTINUED] bupropion.   Allergies: Patient has No Known Allergies.   Social History:  Social History  Tobacco Use  . Smoking status: Never Smoker  . Smokeless tobacco: Never Used  Vaping Use  . Vaping Use: Never used  Substance Use Topics  . Alcohol use: Yes    Comment: occasional  . Drug use: No    Relationship status: married She lives with husband.   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 Regular exercise: Yes: zumba twice a week  Family History:   Family History  Problem Relation Age of Onset  .  Hypertension Mother   . Diabetes Mother   . Obesity Mother   . Hypertension Father   . Diabetes Father   . Sleep apnea Father   . Obesity Father   . Diabetes Maternal Uncle   . Hypertension Maternal Grandfather   . Heart disease Maternal Grandfather   . Diabetes Maternal Grandfather   . Diabetes Paternal Grandmother   . Heart disease Paternal Grandmother   . Seizures Sister   . Stroke Maternal Aunt   . Diabetes Maternal Aunt   . Hypertension Maternal Aunt   . Colon cancer Neg Hx   . Liver disease Neg Hx   . Kidney disease Neg Hx      Review of Systems: Review of Systems  Constitutional: Negative for fever, malaise/fatigue and weight loss.  Respiratory: Negative for cough, shortness of breath and wheezing.   Cardiovascular: Negative for chest pain, palpitations and leg swelling.  Gastrointestinal: Negative for abdominal pain and blood in stool.  Genitourinary: Negative for dysuria.  Musculoskeletal: Negative for myalgias.  Skin: Negative for rash.  Neurological: Negative for dizziness and headaches.  Endo/Heme/Allergies: Does not bruise/bleed easily.  Psychiatric/Behavioral: Negative for depression. The patient is not nervous/anxious.      OBJECTIVE Physical Exam: Vitals:   08/11/20 1449  BP: 118/79  Pulse: 78  Height: 5\' 4"  (1.626 m)    Physical Exam Constitutional:      General: She is not in acute distress. Pulmonary:     Effort: Pulmonary effort is normal.  Abdominal:     General: There is no distension.     Palpations: Abdomen is soft.     Tenderness: There is no abdominal tenderness. There is no rebound.  Musculoskeletal:        General: No swelling. Normal range of motion.  Skin:    General: Skin is warm and dry.     Findings: No rash.  Neurological:     Mental Status: She is alert and oriented to person, place, and time.  Psychiatric:        Mood and Affect: Mood normal.        Behavior: Behavior normal.    GU / Detailed Urogynecologic  Evaluation:  Pelvic Exam: Normal external female genitalia; Bartholin's and Skene's glands normal in appearance; urethral meatus normal in appearance, no urethral masses or discharge.   CST: negative  Speculum exam reveals normal vaginal mucosa without atrophy. Cervix normal appearance. Uterus normal single, nontender. Adnexa no mass, fullness, tenderness.  Felt pressure on bimanual exam, stated that this is the discomfort she has during intercourse  Pelvic floor strength III/V  Pelvic floor musculature: Right levator non-tender, Right obturator non-tender, Left levator non-tender, Left obturator non-tender  POP-Q:   POP-Q  -1                                            Aa   -  1                                           Ba  2                                              C   5                                            Gh  3                                            Pb  8                                            tvl   -2.5                                            Ap  -2.5                                            Bp  -7                                              D     Rectal Exam:  Normal external rectum  Post-Void Residual (PVR) by Bladder Scan: In order to evaluate bladder emptying, we discussed obtaining a postvoid residual and she agreed to this procedure.  Procedure: The ultrasound unit was placed on the patient's abdomen in the suprapubic region after the patient had voided. A PVR of 24 ml was obtained by bladder scan.  Laboratory Results: POC urine: + glucose  I visualized the urine specimen, noting the specimen to be clear yellow  ASSESSMENT AND PLAN Ms. Cathy Ho is a 40 y.o. with:  1. Uterovaginal prolapse, incomplete   2. Prolapse of anterior vaginal wall   3. Urge incontinence   4. Urinary frequency    1. Stage II anterior, Stage I posterior, Stage III apical prolapse  For treatment of pelvic organ prolapse, we discussed options for management  including expectant management, conservative management, and surgical management, such as Kegels, a pessary, pelvic floor physical therapy, and specific surgical procedures. - She is still interested in having children. Therefore, would not recommend surgical intervention at this time as a pregnancy/ delivery could disrupt the repair.  - She is possibly interested in a pessary but will think about it and decide. Overall she is not always bothered by the prolapse, more of the pressure in the pelvis.   2. Urge  incontinence - only happens when bladder is full and she waits too long to urinate. She is not bothered by this so will defer management at this time.   Return as needed.   Jaquita Folds, MD   Medical Decision Making:  - Reviewed/ ordered a clinical laboratory test - Review and summation of prior records

## 2020-08-10 NOTE — Telephone Encounter (Signed)
Attempt made to contact Cathy Ho is a 40 y.o. female re: New pt Pre appt call to collect history information.  -Allergy -Medication -Confirm pharmacy -OB history   Pt was not available. LM on the VM for the patient to call back

## 2020-08-11 ENCOUNTER — Encounter: Payer: Self-pay | Admitting: Obstetrics and Gynecology

## 2020-08-11 ENCOUNTER — Other Ambulatory Visit: Payer: Self-pay

## 2020-08-11 ENCOUNTER — Ambulatory Visit (INDEPENDENT_AMBULATORY_CARE_PROVIDER_SITE_OTHER): Payer: No Typology Code available for payment source | Admitting: Obstetrics and Gynecology

## 2020-08-11 VITALS — BP 118/79 | HR 78 | Ht 64.0 in

## 2020-08-11 DIAGNOSIS — N812 Incomplete uterovaginal prolapse: Secondary | ICD-10-CM | POA: Diagnosis not present

## 2020-08-11 DIAGNOSIS — N8110 Cystocele, unspecified: Secondary | ICD-10-CM

## 2020-08-11 DIAGNOSIS — R350 Frequency of micturition: Secondary | ICD-10-CM

## 2020-08-11 DIAGNOSIS — N3941 Urge incontinence: Secondary | ICD-10-CM

## 2020-08-11 LAB — POCT URINALYSIS DIPSTICK
Appearance: NORMAL
Bilirubin, UA: NEGATIVE
Blood, UA: NEGATIVE
Glucose, UA: POSITIVE — AB
Ketones, UA: NEGATIVE
Leukocytes, UA: NEGATIVE
Nitrite, UA: NEGATIVE
Protein, UA: NEGATIVE
Spec Grav, UA: 1.02 (ref 1.010–1.025)
Urobilinogen, UA: 0.2 E.U./dL
pH, UA: 5 (ref 5.0–8.0)

## 2020-08-11 NOTE — Patient Instructions (Signed)
You have a stage 3 (out of 4) prolapse.  We discussed the fact that it is not life threatening but there are several treatment options. For treatment of pelvic organ prolapse, we discussed options for management including expectant management, conservative management, and surgical management, such as Kegels, a pessary, pelvic floor physical therapy, and specific surgical procedures.      

## 2020-08-23 DIAGNOSIS — U071 COVID-19: Secondary | ICD-10-CM

## 2020-08-23 HISTORY — DX: COVID-19: U07.1

## 2020-08-25 ENCOUNTER — Other Ambulatory Visit (HOSPITAL_COMMUNITY): Payer: Self-pay

## 2020-08-25 MED FILL — Clindamycin Phosphate Lotion 1%: CUTANEOUS | 30 days supply | Qty: 60 | Fill #0 | Status: AC

## 2020-08-26 ENCOUNTER — Other Ambulatory Visit (HOSPITAL_COMMUNITY): Payer: Self-pay

## 2020-08-27 ENCOUNTER — Other Ambulatory Visit (HOSPITAL_COMMUNITY): Payer: Self-pay

## 2020-08-27 ENCOUNTER — Other Ambulatory Visit: Payer: Self-pay

## 2020-08-27 ENCOUNTER — Ambulatory Visit (INDEPENDENT_AMBULATORY_CARE_PROVIDER_SITE_OTHER): Payer: No Typology Code available for payment source | Admitting: Family Medicine

## 2020-08-27 ENCOUNTER — Encounter (INDEPENDENT_AMBULATORY_CARE_PROVIDER_SITE_OTHER): Payer: Self-pay | Admitting: Family Medicine

## 2020-08-27 VITALS — BP 108/69 | HR 78 | Temp 98.5°F | Ht 64.0 in | Wt 234.0 lb

## 2020-08-27 DIAGNOSIS — F3289 Other specified depressive episodes: Secondary | ICD-10-CM

## 2020-08-27 DIAGNOSIS — Z9189 Other specified personal risk factors, not elsewhere classified: Secondary | ICD-10-CM | POA: Diagnosis not present

## 2020-08-27 DIAGNOSIS — R809 Proteinuria, unspecified: Secondary | ICD-10-CM | POA: Diagnosis not present

## 2020-08-27 DIAGNOSIS — Z6841 Body Mass Index (BMI) 40.0 and over, adult: Secondary | ICD-10-CM

## 2020-08-27 DIAGNOSIS — E1129 Type 2 diabetes mellitus with other diabetic kidney complication: Secondary | ICD-10-CM

## 2020-08-27 MED ORDER — WEGOVY 1.7 MG/0.75ML ~~LOC~~ SOAJ
1.7000 mg | SUBCUTANEOUS | 0 refills | Status: DC
  Filled 2020-08-27: qty 3, 28d supply, fill #0

## 2020-08-31 NOTE — Progress Notes (Signed)
Chief Complaint:   OBESITY Cathy Ho is here to discuss her progress with her obesity treatment plan along with follow-up of her obesity related diagnoses. Cathy Ho is on the Category 4 Plan and states she is following her eating plan approximately 75% of the time. Cathy Ho states she is doing Zumba 45 minutes 2 times per week.  Today's visit was #: 15 Starting weight: 284 lbs Starting date: 10/14/2019 Today's weight: 234 lbs Today's date: 08/27/2020 Total lbs lost to date: 50 lbs Total lbs lost since last in-office visit: 5  Interim History: Cathy Ho is doing a great job with weights loss and has lost a total of 50 lbs since June 2021. Her protein intake fairly good. She does skip breakfast at times.  Subjective:   1. Type 2 diabetes mellitus with microalbuminuria, without long-term current use of insulin (HCC) Cathy Ho is on Wegovy. her last A1c was 5.2 at PCP office. She is also on Xigduo. FBG 87-90 and 2 hours post-prandial 79-86. She denies hypoglycemia. Pt reports Wegovy helps with appetite. She denies side effects with Wegovy.  2. Other depression with emotional eating Cathy Ho reports bupropion was affecting her sleep, so she discontinued it. Her cravings are well controlled- probably with the help of Wegovy.   3. At risk for hypoglycemia Cathy Ho is at risk for hypoglycemia due to weight loss and diabetes.  Assessment/Plan:   1. Type 2 diabetes mellitus with microalbuminuria, without long-term current use of insulin (Cathy Ho) Continue Xigduo per PCP and VOHYWV.  - Semaglutide-Weight Management (WEGOVY) 1.7 MG/0.75ML SOAJ; INJECT 1.7 MG INTO THE SKIN ONCE A WEEK.  Dispense: 3 mL; Refill: 0  2. Other depression with emotional eating  Discontinue bupropion.  3. At risk for hypoglycemia Cathy Ho was given approximately 15 minutes of counseling today regarding prevention of hypoglycemia. She was advised of symptoms of hypoglycemia. Cathy Ho was instructed to avoid skipping meals, eat  regular protein rich meals and schedule low calorie snacks as needed.   Repetitive spaced learning was employed today to elicit superior memory formation and behavioral change  4. Obesity: Current BMI 40.15  Cathy Ho is currently in the action stage of change. As such, her goal is to continue with weight loss efforts. She has agreed to the Category 4 Plan and keeping a food journal and adhering to recommended goals of 550-700 calories and 45 g protein.  Pt will have a protein shake in the morning, rather than skipping breakfast.  Exercise goals: As is  Behavioral modification strategies: meal planning and cooking strategies.  Cathy Ho has agreed to follow-up with our clinic in 3 weeks.  Objective:   Blood pressure 108/69, pulse 78, temperature 98.5 F (36.9 C), height 5\' 4"  (1.626 m), weight 234 lb (106.1 kg), SpO2 100 %. Body mass index is 40.17 kg/m.  General: Cooperative, alert, well developed, in no acute distress. HEENT: Conjunctivae and lids unremarkable. Cardiovascular: Regular rhythm.  Lungs: Normal work of breathing. Neurologic: No focal deficits.   Lab Results  Component Value Date   NA 26 (A) 03/11/2020   NA 136 (A) 03/11/2020   NA 136 (A) 03/11/2020   K 4.4 03/11/2020   CL 101 03/11/2020   Lab Results  Component Value Date   ALT 12 03/11/2020   AST 21 03/11/2020   Lab Results  Component Value Date   HGBA1C 5.9 03/11/2020   No results found for: INSULIN No results found for: TSH Lab Results  Component Value Date   CHOL 119 03/11/2020   HDL 29 (  A) 03/11/2020   LDLCALC 3 03/11/2020   Lab Results  Component Value Date   WBC 9.7 03/10/2020   HGB 12.3 11/12/2012    Attestation Statements:   Reviewed by clinician on day of visit: allergies, medications, problem list, medical history, surgical history, family history, social history, and previous encounter notes.  Coral Ceo, CMA, am acting as Location manager for [employer], [place].  I have  reviewed the above documentation for accuracy and completeness, and I agree with the above. -  Georgianne Fick, FNP

## 2020-09-01 ENCOUNTER — Encounter (INDEPENDENT_AMBULATORY_CARE_PROVIDER_SITE_OTHER): Payer: Self-pay | Admitting: Family Medicine

## 2020-09-11 ENCOUNTER — Other Ambulatory Visit (HOSPITAL_COMMUNITY): Payer: Self-pay

## 2020-09-11 MED FILL — Dapagliflozin Prop-Metformin HCl Tab ER 24HR 5-1000 MG: ORAL | 90 days supply | Qty: 180 | Fill #0 | Status: AC

## 2020-09-16 ENCOUNTER — Ambulatory Visit: Payer: No Typology Code available for payment source | Admitting: Obstetrics & Gynecology

## 2020-09-23 ENCOUNTER — Ambulatory Visit (INDEPENDENT_AMBULATORY_CARE_PROVIDER_SITE_OTHER): Payer: No Typology Code available for payment source | Admitting: Family Medicine

## 2020-10-20 ENCOUNTER — Encounter (INDEPENDENT_AMBULATORY_CARE_PROVIDER_SITE_OTHER): Payer: Self-pay | Admitting: Physician Assistant

## 2020-10-20 ENCOUNTER — Other Ambulatory Visit: Payer: Self-pay

## 2020-10-20 ENCOUNTER — Other Ambulatory Visit (HOSPITAL_COMMUNITY): Payer: Self-pay

## 2020-10-20 ENCOUNTER — Ambulatory Visit (INDEPENDENT_AMBULATORY_CARE_PROVIDER_SITE_OTHER): Payer: No Typology Code available for payment source | Admitting: Physician Assistant

## 2020-10-20 VITALS — BP 107/73 | HR 79 | Temp 98.1°F | Ht 64.0 in | Wt 237.0 lb

## 2020-10-20 DIAGNOSIS — E1159 Type 2 diabetes mellitus with other circulatory complications: Secondary | ICD-10-CM | POA: Diagnosis not present

## 2020-10-20 DIAGNOSIS — Z9189 Other specified personal risk factors, not elsewhere classified: Secondary | ICD-10-CM

## 2020-10-20 DIAGNOSIS — Z6841 Body Mass Index (BMI) 40.0 and over, adult: Secondary | ICD-10-CM | POA: Diagnosis not present

## 2020-10-20 DIAGNOSIS — I152 Hypertension secondary to endocrine disorders: Secondary | ICD-10-CM | POA: Diagnosis not present

## 2020-10-20 MED ORDER — LISINOPRIL-HYDROCHLOROTHIAZIDE 10-12.5 MG PO TABS
1.0000 | ORAL_TABLET | Freq: Every day | ORAL | 0 refills | Status: DC
  Filled 2020-10-20: qty 90, 90d supply, fill #0

## 2020-10-20 MED ORDER — WEGOVY 1.7 MG/0.75ML ~~LOC~~ SOAJ
1.7000 mg | SUBCUTANEOUS | 0 refills | Status: DC
  Filled 2020-10-20: qty 3, 28d supply, fill #0

## 2020-10-22 NOTE — Progress Notes (Signed)
Chief Complaint:   OBESITY Cathy Ho is here to discuss her progress with her obesity treatment plan along with follow-up of her obesity related diagnoses. Cathy Ho is on the Category 4 Plan and keeping a food journal and adhering to recommended goals of 550-700 calories and 45 grams of protein at supper daily and states she is following her eating plan approximately 50% of the time. Cathy Ho states she is doing zumba for 45 minutes 5 times per week, and weights for 45-60 minutes 5 times per week.  Today's visit was #: 11 Starting weight: 284 lbs Starting date: 10/14/2019 Today's weight: 237 lbs Today's date: 10/20/2020 Total lbs lost to date: 47 Total lbs lost since last in-office visit: 0  Interim History: Cathy Ho has been stressed with school restarting. She works 7 am-3 pm, and she states she is not a breakfast person. She is drinking shakes, and she is not drinking enough water.  Subjective:   1. Hypertension associated with type 2 diabetes mellitus ([place]) Cathy Ho is on Zestoretic, and her blood pressure is well controlled.  2. At risk for hypoglycemia Cathy Ho is at increased risk for hypoglycemia due to changes in diet, diagnosis of diabetes, and/or insulin use.  Assessment/Plan:   1. Hypertension associated with type 2 diabetes mellitus ([place]) Cathy Ho is working on healthy weight loss and exercise to improve blood pressure control. We will refill Zestoretic for 90 days with no refills, and will watch for signs of hypotension as she continues her lifestyle modifications.  - lisinopril-hydrochlorothiazide (ZESTORETIC) 10-12.5 MG tablet; TAKE 1 TABLET BY MOUTH DAILY.  Dispense: 90 tablet; Refill: 0  2. At risk for hypoglycemia Cathy Ho was given approximately 15 minutes of counseling today regarding prevention of hypoglycemia. She was advised of symptoms of hypoglycemia. Cathy Ho was instructed to avoid skipping meals, eat regular protein rich meals and schedule low calorie snacks as  needed.   Repetitive spaced learning was employed today to elicit superior memory formation and behavioral change  3. Class 3 severe obesity with serious comorbidity and body mass index (BMI) of 40.0 to 44.9 in adult, unspecified obesity type (HCC) Cathy Ho is currently in the action stage of change. As such, her goal is to continue with weight loss efforts. She has agreed to the Category 4 Plan.   We discussed various medication options to help Cathy Ho with her weight loss efforts and we both agreed to continue Thor, and we will refill for 1 month.  - Semaglutide-Weight Management (WEGOVY) 1.7 MG/0.75ML SOAJ; INJECT 1.7 MG INTO THE SKIN ONCE A WEEK.  Dispense: 3 mL; Refill: 0  Exercise goals: As is.  Behavioral modification strategies: increasing lean protein intake, increasing water intake, and no skipping meals.  Cathy Ho has agreed to follow-up with our clinic in 3 weeks. She was informed of the importance of frequent follow-up visits to maximize her success with intensive lifestyle modifications for her multiple health conditions.   Objective:   Blood pressure 107/73, pulse 79, temperature 98.1 F (36.7 C), height 5\' 4"  (1.626 m), weight 237 lb (107.5 kg), last menstrual period 10/17/2020, SpO2 100 %. Body mass index is 40.68 kg/m.  General: Cooperative, alert, well developed, in no acute distress. HEENT: Conjunctivae and lids unremarkable. Cardiovascular: Regular rhythm.  Lungs: Normal work of breathing. Neurologic: No focal deficits.   Lab Results  Component Value Date   NA 26 (A) 03/11/2020   NA 136 (A) 03/11/2020   NA 136 (A) 03/11/2020   K 4.4 03/11/2020   CL 101 03/11/2020  Lab Results  Component Value Date   ALT 12 03/11/2020   AST 21 03/11/2020   Lab Results  Component Value Date   HGBA1C 5.9 03/11/2020   No results found for: INSULIN No results found for: TSH Lab Results  Component Value Date   CHOL 119 03/11/2020   HDL 29 (A) 03/11/2020   LDLCALC 3  03/11/2020   Lab Results  Component Value Date   VD25OH 62.5 03/11/2020   Lab Results  Component Value Date   WBC 9.7 03/10/2020   HGB 12.3 11/12/2012   No results found for: IRON, TIBC, FERRITIN  Attestation Statements:   Reviewed by clinician on day of visit: allergies, medications, problem list, medical history, surgical history, family history, social history, and previous encounter notes.   Wilhemena Durie, am acting as transcriptionist for [employer], PA-C.  I have reviewed the above documentation for accuracy and completeness, and I agree with the above. Abby Potash, PA-C

## 2020-10-23 ENCOUNTER — Ambulatory Visit: Payer: No Typology Code available for payment source | Admitting: Obstetrics & Gynecology

## 2020-11-10 ENCOUNTER — Encounter (INDEPENDENT_AMBULATORY_CARE_PROVIDER_SITE_OTHER): Payer: Self-pay | Admitting: Physician Assistant

## 2020-11-10 ENCOUNTER — Other Ambulatory Visit (HOSPITAL_COMMUNITY): Payer: Self-pay

## 2020-11-10 ENCOUNTER — Other Ambulatory Visit: Payer: Self-pay

## 2020-11-10 ENCOUNTER — Ambulatory Visit (INDEPENDENT_AMBULATORY_CARE_PROVIDER_SITE_OTHER): Payer: No Typology Code available for payment source | Admitting: Physician Assistant

## 2020-11-10 VITALS — BP 111/73 | HR 89 | Temp 98.2°F | Ht 64.0 in | Wt 236.0 lb

## 2020-11-10 DIAGNOSIS — E559 Vitamin D deficiency, unspecified: Secondary | ICD-10-CM | POA: Diagnosis not present

## 2020-11-10 DIAGNOSIS — Z6841 Body Mass Index (BMI) 40.0 and over, adult: Secondary | ICD-10-CM | POA: Diagnosis not present

## 2020-11-10 DIAGNOSIS — Z9189 Other specified personal risk factors, not elsewhere classified: Secondary | ICD-10-CM

## 2020-11-10 MED ORDER — WEGOVY 2.4 MG/0.75ML ~~LOC~~ SOAJ
2.4000 mg | SUBCUTANEOUS | 0 refills | Status: DC
  Filled 2020-11-10: qty 3, 28d supply, fill #0

## 2020-11-17 NOTE — Progress Notes (Signed)
Chief Complaint:   OBESITY Cathy Ho is here to discuss her progress with her obesity treatment plan along with follow-up of her obesity related diagnoses. Cathy Ho is on the Category 4 Plan and states she is following her eating plan approximately 60% of the time. Cathy Ho states she is doing 0 minutes 0 times per week.  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 She skips meals, specifically breakfast intermittently.  Subjective:   1. Vitamin D deficiency Cathy Ho is on Vit D daily, and she is tolerating it well.  2. At risk for impaired metabolic function Cathy Ho is at increased risk for impaired metabolic function due to skipping meals and not eating enough protein.  Assessment/Plan:   1. Vitamin D deficiency Low Vitamin D level contributes to fatigue and are associated with obesity, breast, and colon cancer. Cathy Ho will continue Vitamin D and will follow-up for routine testing of Vitamin D, at least 2-3 times per year to avoid over-replacement.  2. At risk for impaired metabolic function Cathy Ho was given approximately 15 minutes of impaired  metabolic function prevention counseling today. We discussed intensive lifestyle modifications today with an emphasis on specific nutrition and exercise instructions and strategies.   Repetitive spaced learning was employed today to elicit superior memory formation and behavioral change.  3. Class 3 severe obesity with serious comorbidity and body mass index (BMI) of 40.0 to 44.9 in adult, unspecified obesity type (Cathy Ho), current bmi 40.49 Cathy Ho is currently in the action stage of change. As such, her goal is to continue with weight loss efforts. She has agreed to the Category 4  Plan.   We discussed various medication options to help Cathy Ho with her weight loss efforts and we both agreed to increase Wegovy to 2.4 mg weekly with no refills.  - Semaglutide-Weight Management (WEGOVY) 2.4 MG/0.75ML SOAJ; Inject 2.4 mg into the skin once a week.  Dispense: 3 mL; Refill: 0  Exercise goals: No exercise has been prescribed at this time.  Behavioral modification strategies: meal planning and cooking strategies and keeping healthy foods in the home.  Cathy Ho has agreed to follow-up with our clinic in 3 weeks. She was informed of the importance of frequent follow-up visits to maximize her success with intensive lifestyle modifications for her multiple health conditions.   Objective:   Blood pressure 111/73, pulse 89, temperature 98.2 F (36.8 C), height '5\' 4"'$  (1.626 m), weight 236 lb (107 kg), last menstrual period 10/17/2020, SpO2 99 %. Body mass index is 40.51 kg/m.  General: Cooperative, alert, well developed, in no acute distress. HEENT: Conjunctivae and lids unremarkable. Cardiovascular: Regular rhythm.  Lungs: Normal work of breathing. Neurologic: No focal deficits.   Lab Results  Component Value Date   NA 26 (A) 03/11/2020   NA 136 (A) 03/11/2020   NA 136 (A) 03/11/2020   K 4.4 03/11/2020   CL 101 03/11/2020   Lab Results  Component Value Date   ALT 12 03/11/2020   AST 21 03/11/2020   Lab Results  Component Value Date   HGBA1C 5.9 03/11/2020   No results found for: INSULIN No results found for: TSH Lab Results  Component Value Date   CHOL 119 03/11/2020   HDL 29 (A) 03/11/2020  [place] 3 03/11/2020   Lab Results  Component Value Date   VD25OH 62.5 03/11/2020   Lab Results  Component Value Date   WBC 9.7 03/10/2020   HGB 12.3 11/12/2012   No results found for: IRON, TIBC, FERRITIN  Attestation Statements:   Reviewed by clinician on day of visit: allergies, medications, problem list, medical history, surgical history, family  history, social history, and previous encounter notes.   Cathy Ho, am acting as transcriptionist for [employer], PA-C.  I have reviewed the above documentation for accuracy and completeness, and I agree with the above. Abby Potash, PA-C

## 2020-11-30 ENCOUNTER — Encounter (INDEPENDENT_AMBULATORY_CARE_PROVIDER_SITE_OTHER): Payer: Self-pay

## 2020-12-01 ENCOUNTER — Encounter (INDEPENDENT_AMBULATORY_CARE_PROVIDER_SITE_OTHER): Payer: Self-pay | Admitting: Family Medicine

## 2020-12-01 ENCOUNTER — Ambulatory Visit (INDEPENDENT_AMBULATORY_CARE_PROVIDER_SITE_OTHER): Payer: No Typology Code available for payment source | Admitting: Family Medicine

## 2020-12-01 ENCOUNTER — Other Ambulatory Visit: Payer: Self-pay

## 2020-12-01 ENCOUNTER — Other Ambulatory Visit (HOSPITAL_COMMUNITY): Payer: Self-pay

## 2020-12-01 VITALS — BP 110/76 | HR 82 | Temp 98.0°F | Ht 64.0 in | Wt 237.0 lb

## 2020-12-01 DIAGNOSIS — R809 Proteinuria, unspecified: Secondary | ICD-10-CM | POA: Diagnosis not present

## 2020-12-01 DIAGNOSIS — E1159 Type 2 diabetes mellitus with other circulatory complications: Secondary | ICD-10-CM

## 2020-12-01 DIAGNOSIS — E1129 Type 2 diabetes mellitus with other diabetic kidney complication: Secondary | ICD-10-CM | POA: Diagnosis not present

## 2020-12-01 DIAGNOSIS — Z6841 Body Mass Index (BMI) 40.0 and over, adult: Secondary | ICD-10-CM

## 2020-12-01 DIAGNOSIS — E1169 Type 2 diabetes mellitus with other specified complication: Secondary | ICD-10-CM | POA: Insufficient documentation

## 2020-12-01 DIAGNOSIS — I152 Hypertension secondary to endocrine disorders: Secondary | ICD-10-CM

## 2020-12-01 MED ORDER — WEGOVY 2.4 MG/0.75ML ~~LOC~~ SOAJ
2.4000 mg | SUBCUTANEOUS | 0 refills | Status: DC
  Filled 2020-12-01 – 2020-12-16 (×2): qty 3, 28d supply, fill #0

## 2020-12-02 ENCOUNTER — Ambulatory Visit (INDEPENDENT_AMBULATORY_CARE_PROVIDER_SITE_OTHER): Payer: No Typology Code available for payment source | Admitting: Physician Assistant

## 2020-12-02 NOTE — Progress Notes (Signed)
Chief Complaint:   OBESITY Cathy Ho is here to discuss her progress with her obesity treatment plan along with follow-up of her obesity related diagnoses. Cathy Ho is on the Category 4 Plan and states she is following her eating plan approximately 75% of the time. Cathy Ho states she is doing 0 minutes 0 times per week.  Today's visit was #: 73 Starting weight: 284 lbs Starting date: 10/14/2019 Today's weight: 237 lbs Today's date: 12/01/2020 Total lbs lost to date: 47 lbs Total lbs lost since last in-office visit: 0  Interim History: Cathy Ho's weight has been plateaued since May. She is very busy with school and work. She is working on eating all of the food on the plan. She doesn't  always get in breakfast and she says she is snacking too much when studying.  Subjective:   1. Type 2 diabetes mellitus with other specified complication, without long-term current use of insulin (HCC) Cathy Ho is on [place]. Her CBGs range 83-127. Her diabetes mellitus is well controlled. She has occasional nausea. She denies constipation.  Lab Results  Component Value Date   HGBA1C 5.9 03/11/2020   Lab Results  Component Value Date   MICROALBUR 62.8 03/11/2020   Cathy Ho 3 03/11/2020   No results found for: INSULIN   2. Hypertension associated with type 2 diabetes mellitus ([place]) Cathy Ho's hypertension is well controlled on Lisinopril - HCTZ.  BP Readings from Last 3 Encounters:  12/01/20 110/76  11/10/20 111/73  10/20/20 107/73     Assessment/Plan:   1. Type 2 diabetes mellitus with other specified complication, without long-term current use of insulin (HCC) Cathy Ho will bring copy of her labs at next office visit.   2. Hypertension associated with type 2 diabetes mellitus ([place]) Cathy Ho will continue Lisinopril - HCTZ.    3. Obesity: Current BMI 40.66 Cathy Ho is currently in the action stage of change. As such, her goal is to continue with weight loss efforts. She has  agreed to the Category 4 Plan.   Cathy Ho will not buy tempting foods. She may buy only protein snacks.  - Semaglutide-Weight Management (WEGOVY) 2.4 MG/0.75ML SOAJ; Inject 2.4 mg into the skin once a week.  Dispense: 3 mL; Refill: 0  Exercise goals: No exercise has been prescribed at this time.  Behavioral modification strategies: increasing lean protein intake, no skipping meals, ways to avoid night time snacking, and better snacking choices.  Cathy Ho has agreed to follow-up with our clinic in 3-4 weeks.  Objective:   Blood pressure 110/76, pulse 82, temperature 98 F (36.7 C), height '5\' 4"'$  (1.626 m), weight 237 lb (107.5 kg), SpO2 99 %. Body mass index is 40.68 kg/m.  General: Cooperative, alert, well developed, in no acute distress. HEENT: Conjunctivae and lids unremarkable. Cardiovascular: Regular rhythm.  Lungs: Normal work of breathing. Neurologic: No focal deficits.   Lab Results  Component Value Date   NA 26 (A) 03/11/2020   NA 136 (A) 03/11/2020   NA 136 (A) 03/11/2020   K 4.4 03/11/2020   CL 101 03/11/2020   Lab Results  Component Value Date   ALT 12 03/11/2020   AST 21 03/11/2020   Lab Results  Component Value Date   HGBA1C 5.9 03/11/2020   No results found for: INSULIN No results found for: TSH Lab Results  Component Value Date   CHOL 119 03/11/2020   HDL 29 (A) 03/11/2020   LDLCALC 3 03/11/2020   Lab Results  Component Value Date   VD25OH 62.5  03/11/2020   Lab Results  Component Value Date   WBC 9.7 03/10/2020   HGB 12.3 11/12/2012   No results found for: IRON, TIBC, FERRITIN  Attestation Statements:   Reviewed by clinician on day of visit: allergies, medications, problem list, medical history, surgical history, family history, social history, and previous encounter notes.  I, Lizbeth Bark, RMA, am acting as Location manager for [employer], [place].   I have reviewed the above documentation for accuracy and completeness, and I agree with  the above. -  Georgianne Fick, FNP

## 2020-12-04 LAB — CBC: RBC: 4.14 (ref 3.87–5.11)

## 2020-12-04 LAB — CBC AND DIFFERENTIAL
HCT: 34 — AB (ref 36–46)
Hemoglobin: 10.9 — AB (ref 12.0–16.0)
Platelets: 503 — AB (ref 150–399)
WBC: 8.1

## 2020-12-04 LAB — HEMOGLOBIN A1C: Hemoglobin A1C: 5.6

## 2020-12-04 LAB — COMPREHENSIVE METABOLIC PANEL
Albumin: 4.2 (ref 3.5–5.0)
Calcium: 9.9 (ref 8.7–10.7)
GFR calc Af Amer: 126
GFR calc non Af Amer: 109

## 2020-12-04 LAB — BASIC METABOLIC PANEL
BUN: 19 (ref 4–21)
CO2: 25 — AB (ref 13–22)
Chloride: 103 (ref 99–108)
Creatinine: 0.7 (ref 0.5–1.1)
Glucose: 83
Potassium: 4.5 (ref 3.4–5.3)
Sodium: 135 — AB (ref 137–147)

## 2020-12-04 LAB — HEPATIC FUNCTION PANEL
ALT: 14 (ref 7–35)
AST: 13 (ref 13–35)
Alkaline Phosphatase: 86 (ref 25–125)
Bilirubin, Total: 0.2

## 2020-12-08 ENCOUNTER — Other Ambulatory Visit (HOSPITAL_COMMUNITY): Payer: Self-pay

## 2020-12-08 MED ORDER — XIGDUO XR 5-1000 MG PO TB24
2.0000 | ORAL_TABLET | Freq: Every morning | ORAL | 3 refills | Status: DC
  Filled 2020-12-08 – 2020-12-16 (×2): qty 180, 90d supply, fill #0

## 2020-12-08 MED ORDER — LISINOPRIL-HYDROCHLOROTHIAZIDE 10-12.5 MG PO TABS
1.0000 | ORAL_TABLET | Freq: Every day | ORAL | 3 refills | Status: DC
  Filled 2020-12-08 – 2021-02-01 (×2): qty 90, 90d supply, fill #0
  Filled 2021-05-28: qty 90, 90d supply, fill #1
  Filled 2021-08-23: qty 90, 90d supply, fill #2

## 2020-12-16 ENCOUNTER — Other Ambulatory Visit (HOSPITAL_COMMUNITY): Payer: Self-pay

## 2020-12-24 ENCOUNTER — Ambulatory Visit (INDEPENDENT_AMBULATORY_CARE_PROVIDER_SITE_OTHER): Payer: No Typology Code available for payment source | Admitting: Family Medicine

## 2020-12-24 ENCOUNTER — Encounter (INDEPENDENT_AMBULATORY_CARE_PROVIDER_SITE_OTHER): Payer: Self-pay | Admitting: Family Medicine

## 2020-12-24 ENCOUNTER — Other Ambulatory Visit: Payer: Self-pay

## 2020-12-24 ENCOUNTER — Other Ambulatory Visit (HOSPITAL_COMMUNITY): Payer: Self-pay

## 2020-12-24 VITALS — BP 110/72 | HR 80 | Temp 98.1°F | Ht 64.0 in | Wt 233.0 lb

## 2020-12-24 DIAGNOSIS — E1129 Type 2 diabetes mellitus with other diabetic kidney complication: Secondary | ICD-10-CM

## 2020-12-24 DIAGNOSIS — R809 Proteinuria, unspecified: Secondary | ICD-10-CM | POA: Diagnosis not present

## 2020-12-24 DIAGNOSIS — E66813 Obesity, class 3: Secondary | ICD-10-CM

## 2020-12-24 DIAGNOSIS — I152 Hypertension secondary to endocrine disorders: Secondary | ICD-10-CM

## 2020-12-24 DIAGNOSIS — Z6841 Body Mass Index (BMI) 40.0 and over, adult: Secondary | ICD-10-CM

## 2020-12-24 DIAGNOSIS — Z9189 Other specified personal risk factors, not elsewhere classified: Secondary | ICD-10-CM | POA: Diagnosis not present

## 2020-12-24 DIAGNOSIS — E1159 Type 2 diabetes mellitus with other circulatory complications: Secondary | ICD-10-CM | POA: Diagnosis not present

## 2020-12-24 MED ORDER — TIRZEPATIDE 10 MG/0.5ML ~~LOC~~ SOAJ
10.0000 mg | SUBCUTANEOUS | 0 refills | Status: DC
  Filled 2020-12-24: qty 6, 84d supply, fill #0

## 2020-12-25 ENCOUNTER — Other Ambulatory Visit (HOSPITAL_COMMUNITY): Payer: Self-pay

## 2020-12-29 ENCOUNTER — Encounter (INDEPENDENT_AMBULATORY_CARE_PROVIDER_SITE_OTHER): Payer: Self-pay | Admitting: Family Medicine

## 2020-12-29 NOTE — Progress Notes (Signed)
Chief Complaint:   OBESITY Cathy Ho is here to discuss her progress with her obesity treatment plan along with follow-up of her obesity related diagnoses. Cathy Ho is on the Category 4 Plan and states she is following her eating plan approximately 70% of the time. Cathy Ho states she is doing Zumba for 45 minutes 1 times per week.  Today's visit was #: 15 Starting weight: 284 lbs Starting date: 10/14/2019 Today's weight: 233 lbs Today's date: 12/24/2020 Total lbs lost to date: 51 lbs Total lbs lost since last in-office visit: 4 lbs  Interim History: Cathy Ho has been doing a better job with grocery shopping and meal prep. She is down 4 lbs today.  Subjective:   1. Type 2 diabetes mellitus with microalbuminuria, without long-term current use of insulin (HCC) Cathy Ho denies hypoglycemia. Her diabetes mellitus is well controlled. Her last A1C was 5.4 at PCP. She is on [place] 2.4 mg.  Her diabetes is managed by her PCP.  Lab Results  Component Value Date   HGBA1C 5.9 03/11/2020   Lab Results  Component Value Date   MICROALBUR 62.8 03/11/2020   [name] 3 03/11/2020   No results found for: INSULIN   2. Hypertension associated with type 2 diabetes mellitus ([place]) Cathy Ho's hypertension is well controlled on Lisinopril/HCTZ 10/12.5 mg.  BP Readings from Last 3 Encounters:  12/24/20 110/72  12/01/20 110/76  11/10/20 111/73     3. At risk for side effect of medication Cathy Ho is at risk for side effect of medication due to start of Mounjaro.  Assessment/Plan:   1. Type 2 diabetes mellitus with microalbuminuria, without long-term current use of insulin (HCC) Cathy Ho will discontinue [id number]. She agrees to start Mounjaro 10 mg weekly with no refills.Continue Xigduo.  - tirzepatide (MOUNJARO) 10 MG/0.5ML Pen; Inject 10 mg into the skin once a week.  Dispense: 6 mL; Refill: 0  2. Hypertension associated with type 2 diabetes mellitus ([place]) Cathy Ho will continue  Lisinopril/HCTZ 10/12.5. She is working on healthy weight loss and exercise to improve blood pressure control. We will watch for signs of hypotension as she continues her lifestyle modifications.   3. At risk for side effect of medication Cathy Ho was given approximately 15 minutes of drug side effect counseling today.  We discussed side effect possibility and risk versus benefits. Cathy Ho agreed to the medication and will contact this office if these side effects are intolerable.  Repetitive spaced learning was employed today to elicit superior memory formation and behavioral change.   4. Obesity with current BMI of 39.97 Cathy Ho is currently in the action stage of change. As such, her goal is to continue with weight loss efforts. She has agreed to the Category 4 Plan.   Exercise goals:  Cathy Ho will increase frequency of Zumba.  Behavioral modification strategies: increasing lean protein intake and decreasing simple carbohydrates.  Cathy Ho has agreed to follow-up with our clinic in 3-4 weeks.   Objective:   Blood pressure 110/72, pulse 80, temperature 98.1 F (36.7 C), height '5\' 4"'$  (1.626 m), weight 233 lb (105.7 kg), last menstrual period 12/10/2020, SpO2 99 %. Body mass index is 39.99 kg/m.  General: Cooperative, alert, well developed, in no acute distress. HEENT: Conjunctivae and lids unremarkable. Cardiovascular: Regular rhythm.  Lungs: Normal work of breathing. Neurologic: No focal deficits.   Lab Results  Component Value Date   NA 26 (A) 03/11/2020   NA 136 (A) 03/11/2020   NA 136 (A) 03/11/2020   K 4.4 03/11/2020  CL 101 03/11/2020   Lab Results  Component Value Date   ALT 12 03/11/2020   AST 21 03/11/2020   Lab Results  Component Value Date   HGBA1C 5.9 03/11/2020   No results found for: INSULIN No results found for: TSH Lab Results  Component Value Date   CHOL 119 03/11/2020   HDL 29 (A) 03/11/2020   LDLCALC 3 03/11/2020   Lab Results  Component  Value Date   VD25OH 62.5 03/11/2020   Lab Results  Component Value Date   WBC 9.7 03/10/2020   HGB 12.3 11/12/2012   No results found for: IRON, TIBC, FERRITIN  Attestation Statements:   Reviewed by clinician on day of visit: allergies, medications, problem list, medical history, surgical history, family history, social history, and previous encounter notes.  I, Cathy Ho, RMA, am acting as Location manager for Cathy Ho, Cathy Ho.   I have reviewed the above documentation for accuracy and completeness, and I agree with the above. -  Cathy Fick, FNP

## 2020-12-30 ENCOUNTER — Other Ambulatory Visit (HOSPITAL_COMMUNITY): Payer: Self-pay

## 2021-01-18 ENCOUNTER — Encounter (INDEPENDENT_AMBULATORY_CARE_PROVIDER_SITE_OTHER): Payer: Self-pay | Admitting: Family Medicine

## 2021-01-18 ENCOUNTER — Other Ambulatory Visit (INDEPENDENT_AMBULATORY_CARE_PROVIDER_SITE_OTHER): Payer: Self-pay

## 2021-01-18 ENCOUNTER — Ambulatory Visit (INDEPENDENT_AMBULATORY_CARE_PROVIDER_SITE_OTHER): Payer: No Typology Code available for payment source | Admitting: Family Medicine

## 2021-01-18 ENCOUNTER — Other Ambulatory Visit: Payer: Self-pay

## 2021-01-18 VITALS — BP 115/73 | HR 84 | Temp 98.1°F | Ht 64.0 in | Wt 232.0 lb

## 2021-01-18 DIAGNOSIS — Z6841 Body Mass Index (BMI) 40.0 and over, adult: Secondary | ICD-10-CM | POA: Diagnosis not present

## 2021-01-18 DIAGNOSIS — R809 Proteinuria, unspecified: Secondary | ICD-10-CM | POA: Diagnosis not present

## 2021-01-18 DIAGNOSIS — E1129 Type 2 diabetes mellitus with other diabetic kidney complication: Secondary | ICD-10-CM | POA: Diagnosis not present

## 2021-01-18 DIAGNOSIS — E66813 Obesity, class 3: Secondary | ICD-10-CM

## 2021-01-19 NOTE — Progress Notes (Signed)
Chief Complaint:   OBESITY Cathy Ho is here to discuss her progress with her obesity treatment plan along with follow-up of her obesity related diagnoses. Cathy Ho is on the Category 4 Plan and states she is following her eating plan approximately 70% of the time. Cathy Ho states she is walking and Zumba for 30-45 minutes 1 times per week.  Today's visit was #: 20 Starting weight: 284 lbs Starting date: 10/14/2019 Today's weight: 232 lbs Today's date: 01/18/2021 Total lbs lost to date: 52 lbs Total lbs lost since last in-office visit: 1 lbs  Interim History: Cathy Ho is not currently in school so she is trying to add more exercise in. She skips breakfast at times and does not always eat the lunch she packs for work.   Subjective:   1. Type 2 diabetes mellitus with microalbuminuria, without long-term current use of insulin (HCC) Cathy Ho has not started the [facility] yet. She finished up her Ozempic last week. Her CBGs range 80-132. She is on Xigduo and Ozempic.   Lab Results  Component Value Date   HGBA1C 5.6 12/04/2020   HGBA1C 5.9 03/11/2020   Lab Results  Component Value Date   MICROALBUR 62.8 03/11/2020   LDLCALC 3 03/11/2020   CREATININE 0.7 12/04/2020   No results found for: INSULIN   Assessment/Plan:   1. Type 2 diabetes mellitus with microalbuminuria, without long-term current use of insulin ([place]) Cathy Ho will start Mounjaro this week. Continue Xigduo.  2. Obesity with current BMI of 39.8 Cathy Ho is currently in the action stage of change. As such, her goal is to continue with weight loss efforts. She has agreed to the Category 4 Plan.   Cathy Ho may have protein bar or shake for a meal (must have at least 20 grams of protein).  Exercise goals:  Cathy Ho will increase to 2-3 times per week.  Behavioral modification strategies: increasing lean protein intake, no skipping meals, and meal planning and cooking strategies.  Cathy Ho has agreed to follow-up with our  clinic in 3-4 weeks.  Objective:   Blood pressure 115/73, pulse 84, temperature 98.1 F (36.7 C), height 5\' 4"  (1.626 m), weight 232 lb (105.2 kg), SpO2 98 %. Body mass index is 39.82 kg/m.  General: Cooperative, alert, well developed, in no acute distress. HEENT: Conjunctivae and lids unremarkable. Cardiovascular: Regular rhythm.  Lungs: Normal work of breathing. Neurologic: No focal deficits.   Lab Results  Component Value Date   CREATININE 0.7 12/04/2020   BUN 19 12/04/2020   NA 135 (A) 12/04/2020   K 4.5 12/04/2020   CL 103 12/04/2020   CO2 25 (A) 12/04/2020   Lab Results  Component Value Date   ALT 14 12/04/2020   AST 13 12/04/2020   ALKPHOS 86 12/04/2020   Lab Results  Component Value Date   HGBA1C 5.6 12/04/2020   HGBA1C 5.9 03/11/2020   No results found for: INSULIN No results found for: TSH Lab Results  Component Value Date   CHOL 119 03/11/2020   HDL 29 (A) 03/11/2020   LDLCALC 3 03/11/2020   Lab Results  Component Value Date   VD25OH 62.5 03/11/2020   Lab Results  Component Value Date   WBC 8.1 12/04/2020   HGB 10.9 (A) 12/04/2020   HCT 34 (A) 12/04/2020   PLT 503 (A) 12/04/2020   No results found for: IRON, TIBC, FERRITIN  Attestation Statements:   Reviewed by clinician on day of visit: allergies, medications, problem list, medical history, surgical history, family history, social  history, and previous encounter notes.  I, Lizbeth Bark, RMA, am acting as Location manager for [employer], [place].   I have reviewed the above documentation for accuracy and completeness, and I agree with the above. -  Georgianne Fick, FNP

## 2021-02-01 ENCOUNTER — Other Ambulatory Visit (HOSPITAL_COMMUNITY): Payer: Self-pay

## 2021-02-10 ENCOUNTER — Other Ambulatory Visit: Payer: Self-pay | Admitting: Family Medicine

## 2021-02-10 DIAGNOSIS — Z1231 Encounter for screening mammogram for malignant neoplasm of breast: Secondary | ICD-10-CM

## 2021-02-11 ENCOUNTER — Other Ambulatory Visit (HOSPITAL_COMMUNITY): Payer: Self-pay

## 2021-02-11 MED ORDER — DOXYCYCLINE MONOHYDRATE 100 MG PO CAPS
100.0000 mg | ORAL_CAPSULE | Freq: Two times a day (BID) | ORAL | 0 refills | Status: DC
  Filled 2021-02-11: qty 14, 7d supply, fill #0

## 2021-02-12 ENCOUNTER — Ambulatory Visit (INDEPENDENT_AMBULATORY_CARE_PROVIDER_SITE_OTHER): Payer: No Typology Code available for payment source | Admitting: Obstetrics & Gynecology

## 2021-02-12 ENCOUNTER — Other Ambulatory Visit: Payer: Self-pay

## 2021-02-12 ENCOUNTER — Encounter: Payer: Self-pay | Admitting: Obstetrics & Gynecology

## 2021-02-12 ENCOUNTER — Other Ambulatory Visit (HOSPITAL_COMMUNITY)
Admission: RE | Admit: 2021-02-12 | Discharge: 2021-02-12 | Disposition: A | Discharge status: Home / Self Care | Payer: No Typology Code available for payment source | Source: Ambulatory Visit | Attending: Obstetrics & Gynecology | Admitting: Obstetrics & Gynecology

## 2021-02-12 VITALS — BP 129/94 | HR 78 | Wt 231.8 lb

## 2021-02-12 DIAGNOSIS — Z3202 Encounter for pregnancy test, result negative: Secondary | ICD-10-CM

## 2021-02-12 DIAGNOSIS — N939 Abnormal uterine and vaginal bleeding, unspecified: Secondary | ICD-10-CM

## 2021-02-12 DIAGNOSIS — N8502 Endometrial intraepithelial neoplasia [EIN]: Secondary | ICD-10-CM | POA: Insufficient documentation

## 2021-02-12 LAB — POCT PREGNANCY, URINE: Preg Test, Ur: NEGATIVE

## 2021-02-12 NOTE — Patient Instructions (Signed)
ENDOMETRIAL BIOPSY POST-PROCEDURE INSTRUCTIONS  You may take Ibuprofen, Aleve or Tylenol for pain if needed.  Cramping should resolve within in 24 hours.  You may have a small amount of spotting.  You should wear a mini pad for the next few days.  You may have intercourse after 24 hours.  You need to call if you have any pelvic pain, fever, heavy bleeding or foul smelling vaginal discharge.  Shower or bathe as normal  6. We will call you within one week with results  

## 2021-02-12 NOTE — Progress Notes (Signed)
      GYNECOLOGY OFFICE PROCEDURE NOTE   Cathy Ho is a 40 y.o. G0P0 here for endometrial biopsy for abnormal uterine bleeding and history of complex endometrial hyperplasia with atypia.    07/28/2020 TRANSABDOMINAL AND TRANSVAGINAL ULTRASOUND OF PELVIS  CLINICAL DATA:  History of endometrial hyperplasia TECHNIQUE: Both transabdominal and transvaginal ultrasound examinations of the pelvis were performed. Transabdominal technique was performed for global imaging of the pelvis including uterus, ovaries, adnexal regions, and pelvic cul-de-sac. It was necessary to proceed with endovaginal exam following the transabdominal exam to visualize the ovaries. COMPARISON:  None FINDINGS: Uterus Measurements: 8.4 x 4.5 x 6 cm = volume: 120 mL. A fibroid is noted at the uterine fundus measuring 1.8 x 1.1 x 0.9 cm. This fibroid appears to be intramural in location. Endometrium Thickness: 6 mm. There is an endometrial polyp measuring 1.1 x 0.6 x 0.8 cm. Right ovary Measurements: 3.9 x 2.8 x 2.8 cm = volume: 16 mL. Normal appearance/no adnexal mass. Left ovary Measurements: 3.9 x 2.6 x 3.6 cm = volume: 20 mL. Normal appearance/no adnexal mass. Other findings There is a trace volume of pelvic free fluid. IMPRESSION: 1. Possible endometrial polyp versus endometrial hyperplasia. Gynecologic consultation is recommended. 2. Small uterine fibroid as detailed above. Electronically Signed   By: Constance Holster M.D.   On: 07/28/2020 22:06    Today, she reports no concerning symptoms.  No heavy bleeding but reports intermenstrual bleeding.  Of note, pap on 07/08/2020 was normal, negative HPV.   ENDOMETRIAL BIOPSY     The indications for endometrial biopsy were reviewed.   Risks of the biopsy including cramping, bleeding, infection, uterine perforation, inadequate specimen and need for additional procedures were discussed. Offered alternative of hysteroscopy, dilation and curettage in OR. The patient states she understands  the R/B/I/A and agrees to undergo procedure today. Urine pregnancy test was negative. Consent was signed. Time out was performed.    Patient was positioned in dorsal lithotomy position. A vaginal speculum was placed.  The cervix was visualized and was prepped with Betadine.  The 3 mm pipelle was easily introduced into the endometrial cavity without difficulty to a depth of 9 cm, and a moderate amount of tissue was obtained after two passes and sent to pathology. The instruments were removed from the patient's vagina. Minimal bleeding from the cervix was noted. The patient tolerated the procedure well.   Patient was given post procedure instructions.  Will follow up pathology and manage accordingly; patient will be contacted with results and recommendations.  Routine preventative health maintenance measures emphasized.       Verita Schneiders, MD, [place] for [employer], [place]

## 2021-02-15 ENCOUNTER — Ambulatory Visit (INDEPENDENT_AMBULATORY_CARE_PROVIDER_SITE_OTHER): Payer: No Typology Code available for payment source | Admitting: Family Medicine

## 2021-02-15 LAB — SURGICAL PATHOLOGY

## 2021-02-16 NOTE — Progress Notes (Signed)
Endometrial biopsy results is concerning for recurrence of complex endometrial hyperplasia with atypia.  Given such high possibility of co-existent endometrial carcinoma or progression to carcinoma (up to 40 % five-year cumulative rate for untreated patients), further evaluation and management is needed. I called patient, and discussed results.  Hysteroscopy, D&C recommended for further evaluation to rule out co-existent neoplasia.  Discussed long term management option of hysterectomy with patient, she is hoping to retain to retain her uterus and do progestin therapy with surveillance biopsies if no endometrial carcinoma is found (she has done this in the past when she had this diagnosis; she still hopes for future fertility).  She was told further management will depend on surgical pathology results.  Message sent to surgical scheduler to schedule Hysteroscopy, Dilation and Curettage.  Patient was sent information about procedure details.  She will be contacted about procedure scheduling details.  Verita Schneiders, MD

## 2021-02-18 ENCOUNTER — Encounter (INDEPENDENT_AMBULATORY_CARE_PROVIDER_SITE_OTHER): Payer: Self-pay

## 2021-02-22 ENCOUNTER — Ambulatory Visit: Payer: Self-pay | Admitting: Physician Assistant

## 2021-02-23 ENCOUNTER — Ambulatory Visit: Payer: No Typology Code available for payment source | Admitting: Podiatry

## 2021-02-24 ENCOUNTER — Ambulatory Visit: Payer: Self-pay | Admitting: Physician Assistant

## 2021-02-24 ENCOUNTER — Other Ambulatory Visit: Payer: Self-pay

## 2021-02-24 ENCOUNTER — Encounter (HOSPITAL_BASED_OUTPATIENT_CLINIC_OR_DEPARTMENT_OTHER): Payer: Self-pay | Admitting: Obstetrics & Gynecology

## 2021-02-24 NOTE — Progress Notes (Signed)
Spoke w/ via phone for pre-op interview---Patient Lab needs dos---- I stat, UPT, EKG              Lab results------none COVID test -----patient states asymptomatic no test needed Arrive at -------1215 NPO after MN NO Solid Food.  Clear liquids from MN until---1100 Med rec completed Medications to take morning of surgery -----none Diabetic medication -----hold Patient instructed no nail polish to be worn day of surgery Patient instructed to bring photo id and insurance card day of surgery Patient aware to have Driver (ride ) / caregiver    for 24 hours after surgery  Cathy Ho Patient Special Instructions -----none Pre-Op special Istructions -----none Patient verbalized understanding of instructions that were given at this phone interview. Patient denies shortness of breath, chest pain, fever, cough at this phone interview.

## 2021-03-01 NOTE — Progress Notes (Signed)
Pt called menustral cycle feels like its about to start, pt given sally phone number at office

## 2021-03-03 ENCOUNTER — Ambulatory Visit (HOSPITAL_BASED_OUTPATIENT_CLINIC_OR_DEPARTMENT_OTHER)
Admission: RE | Admit: 2021-03-03 | Discharge: 2021-03-03 | Disposition: A | Discharge status: Home / Self Care | Payer: No Typology Code available for payment source | Source: Ambulatory Visit | Attending: Obstetrics & Gynecology | Admitting: Obstetrics & Gynecology

## 2021-03-03 ENCOUNTER — Ambulatory Visit (HOSPITAL_BASED_OUTPATIENT_CLINIC_OR_DEPARTMENT_OTHER): Payer: No Typology Code available for payment source | Admitting: Anesthesiology

## 2021-03-03 ENCOUNTER — Ambulatory Visit (INDEPENDENT_AMBULATORY_CARE_PROVIDER_SITE_OTHER): Payer: No Typology Code available for payment source | Admitting: Family Medicine

## 2021-03-03 ENCOUNTER — Other Ambulatory Visit (HOSPITAL_COMMUNITY): Payer: Self-pay

## 2021-03-03 ENCOUNTER — Encounter (HOSPITAL_BASED_OUTPATIENT_CLINIC_OR_DEPARTMENT_OTHER): Payer: Self-pay | Admitting: Obstetrics & Gynecology

## 2021-03-03 ENCOUNTER — Encounter (HOSPITAL_BASED_OUTPATIENT_CLINIC_OR_DEPARTMENT_OTHER)
Admission: RE | Disposition: A | Discharge status: Home / Self Care | Payer: Self-pay | Source: Ambulatory Visit | Attending: Obstetrics & Gynecology

## 2021-03-03 DIAGNOSIS — N8502 Endometrial intraepithelial neoplasia [EIN]: Secondary | ICD-10-CM | POA: Diagnosis present

## 2021-03-03 DIAGNOSIS — N812 Incomplete uterovaginal prolapse: Secondary | ICD-10-CM | POA: Insufficient documentation

## 2021-03-03 DIAGNOSIS — N920 Excessive and frequent menstruation with regular cycle: Secondary | ICD-10-CM | POA: Diagnosis present

## 2021-03-03 DIAGNOSIS — E119 Type 2 diabetes mellitus without complications: Secondary | ICD-10-CM | POA: Diagnosis not present

## 2021-03-03 HISTORY — PX: HYSTEROSCOPY WITH D & C: SHX1775

## 2021-03-03 HISTORY — DX: Presence of spectacles and contact lenses: Z97.3

## 2021-03-03 LAB — CBC
HCT: 34 % — ABNORMAL LOW (ref 36.0–46.0)
Hemoglobin: 10.7 g/dL — ABNORMAL LOW (ref 12.0–15.0)
MCH: 25 pg — ABNORMAL LOW (ref 26.0–34.0)
MCHC: 31.5 g/dL (ref 30.0–36.0)
MCV: 79.4 fL — ABNORMAL LOW (ref 80.0–100.0)
Platelets: 483 10*3/uL — ABNORMAL HIGH (ref 150–400)
RBC: 4.28 MIL/uL (ref 3.87–5.11)
RDW: 17.1 % — ABNORMAL HIGH (ref 11.5–15.5)
WBC: 11.2 10*3/uL — ABNORMAL HIGH (ref 4.0–10.5)
nRBC: 0 % (ref 0.0–0.2)

## 2021-03-03 LAB — POCT I-STAT, CHEM 8
BUN: 8 mg/dL (ref 6–20)
Calcium, Ion: 1.22 mmol/L (ref 1.15–1.40)
Chloride: 99 mmol/L (ref 98–111)
Creatinine, Ser: 0.6 mg/dL (ref 0.44–1.00)
Glucose, Bld: 93 mg/dL (ref 70–99)
HCT: 36 % (ref 36.0–46.0)
Hemoglobin: 12.2 g/dL (ref 12.0–15.0)
Potassium: 3.5 mmol/L (ref 3.5–5.1)
Sodium: 137 mmol/L (ref 135–145)
TCO2: 25 mmol/L (ref 22–32)

## 2021-03-03 LAB — POCT PREGNANCY, URINE: Preg Test, Ur: NEGATIVE

## 2021-03-03 SURGERY — DILATATION AND CURETTAGE /HYSTEROSCOPY
Anesthesia: General | Site: Vagina

## 2021-03-03 MED ORDER — LIDOCAINE 2% (20 MG/ML) 5 ML SYRINGE
INTRAMUSCULAR | Status: AC
  Filled 2021-03-03: qty 5

## 2021-03-03 MED ORDER — DEXAMETHASONE SODIUM PHOSPHATE 10 MG/ML IJ SOLN
INTRAMUSCULAR | Status: AC
  Filled 2021-03-03: qty 1

## 2021-03-03 MED ORDER — SODIUM CHLORIDE 0.9 % IR SOLN
Status: DC | PRN
  Administered 2021-03-03: 3000 mL

## 2021-03-03 MED ORDER — ONDANSETRON HCL 4 MG/2ML IJ SOLN
INTRAMUSCULAR | Status: AC
  Filled 2021-03-03: qty 2

## 2021-03-03 MED ORDER — ACETAMINOPHEN 160 MG/5ML PO SOLN: 325.0000 mg | ORAL | Status: DC | PRN

## 2021-03-03 MED ORDER — OXYCODONE HCL 5 MG/5ML PO SOLN: 5.0000 mg | Freq: Once | ORAL | Status: DC | PRN

## 2021-03-03 MED ORDER — PROPOFOL 10 MG/ML IV BOLUS
INTRAVENOUS | Status: AC
  Filled 2021-03-03: qty 20

## 2021-03-03 MED ORDER — BUPIVACAINE HCL 0.5 % IJ SOLN
INTRAMUSCULAR | Status: DC | PRN
  Administered 2021-03-03: 30 mL

## 2021-03-03 MED ORDER — PROMETHAZINE HCL 25 MG/ML IJ SOLN: 6.2500 mg | INTRAMUSCULAR | Status: DC | PRN

## 2021-03-03 MED ORDER — OXYCODONE HCL 5 MG PO TABS
5.0000 mg | ORAL_TABLET | ORAL | 0 refills | Status: DC | PRN
Start: 2021-03-03 — End: 2021-07-01
  Filled 2021-03-03: qty 30, 5d supply, fill #0

## 2021-03-03 MED ORDER — GABAPENTIN 300 MG PO CAPS
ORAL_CAPSULE | ORAL | Status: AC
  Filled 2021-03-03: qty 1

## 2021-03-03 MED ORDER — PROPOFOL 10 MG/ML IV BOLUS
INTRAVENOUS | Status: DC | PRN
  Administered 2021-03-03: 200 mg via INTRAVENOUS

## 2021-03-03 MED ORDER — OXYCODONE HCL 5 MG PO TABS: 5.0000 mg | ORAL_TABLET | Freq: Once | ORAL | Status: DC | PRN

## 2021-03-03 MED ORDER — FENTANYL CITRATE (PF) 100 MCG/2ML IJ SOLN
INTRAMUSCULAR | Status: AC
  Filled 2021-03-03: qty 2

## 2021-03-03 MED ORDER — DOCUSATE SODIUM 100 MG PO CAPS
100.0000 mg | ORAL_CAPSULE | Freq: Two times a day (BID) | ORAL | 2 refills | Status: DC | PRN
  Filled 2021-03-03: qty 30, 15d supply, fill #0

## 2021-03-03 MED ORDER — ACETAMINOPHEN 500 MG PO TABS
1000.0000 mg | ORAL_TABLET | ORAL | Status: AC
  Administered 2021-03-03: 1000 mg via ORAL

## 2021-03-03 MED ORDER — FENTANYL CITRATE (PF) 100 MCG/2ML IJ SOLN
INTRAMUSCULAR | Status: DC | PRN
  Administered 2021-03-03: 50 ug via INTRAVENOUS
  Administered 2021-03-03 (×2): 25 ug via INTRAVENOUS

## 2021-03-03 MED ORDER — NAPROXEN SODIUM 550 MG PO TABS
550.0000 mg | ORAL_TABLET | Freq: Two times a day (BID) | ORAL | 2 refills | Status: DC | PRN
  Filled 2021-03-03: qty 30, 15d supply, fill #0

## 2021-03-03 MED ORDER — LIDOCAINE 2% (20 MG/ML) 5 ML SYRINGE
INTRAMUSCULAR | Status: DC | PRN
  Administered 2021-03-03: 60 mg via INTRAVENOUS

## 2021-03-03 MED ORDER — SCOPOLAMINE 1 MG/3DAYS TD PT72
MEDICATED_PATCH | TRANSDERMAL | Status: AC
  Filled 2021-03-03: qty 1

## 2021-03-03 MED ORDER — SCOPOLAMINE 1 MG/3DAYS TD PT72
1.0000 | MEDICATED_PATCH | TRANSDERMAL | Status: DC
  Administered 2021-03-03: 1.5 mg via TRANSDERMAL

## 2021-03-03 MED ORDER — DEXAMETHASONE SODIUM PHOSPHATE 10 MG/ML IJ SOLN
INTRAMUSCULAR | Status: DC | PRN
  Administered 2021-03-03: 10 mg via INTRAVENOUS

## 2021-03-03 MED ORDER — KETOROLAC TROMETHAMINE 30 MG/ML IJ SOLN
INTRAMUSCULAR | Status: DC | PRN
  Administered 2021-03-03: 30 mg via INTRAVENOUS

## 2021-03-03 MED ORDER — POVIDONE-IODINE 10 % EX SWAB: 2.0000 "application " | Freq: Once | CUTANEOUS | Status: DC

## 2021-03-03 MED ORDER — ACETAMINOPHEN 325 MG PO TABS: 325.0000 mg | ORAL_TABLET | ORAL | Status: DC | PRN

## 2021-03-03 MED ORDER — MIDAZOLAM HCL 5 MG/5ML IJ SOLN
INTRAMUSCULAR | Status: DC | PRN
  Administered 2021-03-03: 2 mg via INTRAVENOUS

## 2021-03-03 MED ORDER — SILVER NITRATE-POT NITRATE 75-25 % EX MISC
CUTANEOUS | Status: DC | PRN
  Administered 2021-03-03: 1

## 2021-03-03 MED ORDER — AMISULPRIDE (ANTIEMETIC) 5 MG/2ML IV SOLN: 10.0000 mg | Freq: Once | INTRAVENOUS | Status: DC | PRN

## 2021-03-03 MED ORDER — FENTANYL CITRATE (PF) 100 MCG/2ML IJ SOLN: 25.0000 ug | INTRAMUSCULAR | Status: DC | PRN

## 2021-03-03 MED ORDER — ONDANSETRON HCL 4 MG/2ML IJ SOLN
INTRAMUSCULAR | Status: DC | PRN
  Administered 2021-03-03: 4 mg via INTRAVENOUS

## 2021-03-03 MED ORDER — ACETAMINOPHEN 500 MG PO TABS
ORAL_TABLET | ORAL | Status: AC
  Filled 2021-03-03: qty 2

## 2021-03-03 MED ORDER — LACTATED RINGERS IV SOLN: INTRAVENOUS | Status: DC

## 2021-03-03 MED ORDER — MIDAZOLAM HCL 2 MG/2ML IJ SOLN
INTRAMUSCULAR | Status: AC
  Filled 2021-03-03: qty 2

## 2021-03-03 MED ORDER — GABAPENTIN 300 MG PO CAPS
300.0000 mg | ORAL_CAPSULE | ORAL | Status: AC
  Administered 2021-03-03: 300 mg via ORAL

## 2021-03-03 MED ORDER — ACETAMINOPHEN 10 MG/ML IV SOLN: 1000.0000 mg | Freq: Once | INTRAVENOUS | Status: DC | PRN

## 2021-03-03 SURGICAL SUPPLY — 14 items
DRSG TELFA 3X8 NADH (GAUZE/BANDAGES/DRESSINGS) ×4 IMPLANT
GAUZE 4X4 16PLY ~~LOC~~+RFID DBL (SPONGE) ×2 IMPLANT
GLOVE SURG ENC MOIS LTX SZ6 (GLOVE) ×2 IMPLANT
GLOVE SURG LTX SZ7 (GLOVE) ×2 IMPLANT
GLOVE SURG UNDER POLY LF SZ6 (GLOVE) ×2 IMPLANT
GLOVE SURG UNDER POLY LF SZ7 (GLOVE) ×6 IMPLANT
GOWN STRL REUS W/TWL LRG LVL3 (GOWN DISPOSABLE) ×4 IMPLANT
IV NS IRRIG 3000ML ARTHROMATIC (IV SOLUTION) ×2 IMPLANT
KIT PROCEDURE FLUENT (KITS) ×2 IMPLANT
KIT TURNOVER CYSTO (KITS) ×2 IMPLANT
PACK VAGINAL MINOR WOMEN LF (CUSTOM PROCEDURE TRAY) ×2 IMPLANT
PAD OB MATERNITY 4.3X12.25 (PERSONAL CARE ITEMS) ×2 IMPLANT
SEAL ROD LENS SCOPE MYOSURE (ABLATOR) ×2 IMPLANT
UNDERPAD 30X36 HEAVY ABSORB (UNDERPADS AND DIAPERS) ×2 IMPLANT

## 2021-03-03 NOTE — Anesthesia Procedure Notes (Signed)
Procedure Name: LMA Insertion Date/Time: 03/03/2021 2:25 PM Performed by: Glenisha Gundry D, CRNA Pre-anesthesia Checklist: Patient identified, Emergency Drugs available, Suction available and Patient being monitored Patient Re-evaluated:Patient Re-evaluated prior to induction Oxygen Delivery Method: Circle system utilized Preoxygenation: Pre-oxygenation with 100% oxygen Induction Type: IV induction Ventilation: Mask ventilation without difficulty LMA: LMA inserted LMA Size: 4.0 Number of attempts: 1 Placement Confirmation: positive ETCO2 and breath sounds checked- equal and bilateral Tube secured with: Tape Dental Injury: Teeth and Oropharynx as per pre-operative assessment

## 2021-03-03 NOTE — Transfer of Care (Signed)
Immediate Anesthesia Transfer of Care Note  Patient: Cathy Ho  Procedure(s) Performed: DILATATION AND CURETTAGE /HYSTEROSCOPY (Vagina )  Patient Location: PACU  Anesthesia Type:General  Level of Consciousness: awake, alert , oriented and patient cooperative  Airway & Oxygen Therapy: Patient Spontanous Breathing  Post-op Assessment: Report given to RN and Post -op Vital signs reviewed and stable  Post vital signs: Reviewed and stable  Last Vitals:  Vitals Value Taken Time  BP 142/94 03/03/21 1505  Temp    Pulse 87 03/03/21 1508  Resp 31 03/03/21 1508  SpO2 96 % 03/03/21 1508  Vitals shown include unvalidated device data.  Last Pain:  Vitals:   03/03/21 1253  TempSrc: Oral  PainSc: 0-No pain      Patients Stated Pain Goal: 5 (40/34/74 2595)  Complications: No notable events documented.

## 2021-03-03 NOTE — H&P (Addendum)
Preoperative History and Physical  Cathy Ho is a 40 y.o. G0P0 here for further surgical evaluation of recurrent of complex endometrial hyperplasia with atypia. Patient had a history of this in the past, but had normal biopsies afterwards.   Endometrial biopsy was done for reported menorrhagia.  No significant preoperative concerns.  Proposed surgery: Hysteroscopy, Dilation and Curettage  Past Medical History:  Diagnosis Date   Anemia    Back pain    Chest pain    COVID 08/2020   Runny nose, fever, HA symptoms resolved   Diabetes mellitus without complication ([place]) 7169   diet and exercise only   Fibroids 2010   Hypertension    Joint pain    Low iron    PCOS (polycystic ovarian syndrome)    Prolapsed uterus 2010   Sleep apnea    cpap, pt does not know settings   Vitamin D deficiency    Wears glasses    Past Surgical History:  Procedure Laterality Date   carbuckle cyst in back removed  02/2005   COLONOSCOPY N/A 08/15/2013   Procedure: COLONOSCOPY;  Surgeon: Inda Castle, MD;  Location: [facility];  Service: Endoscopy;  Laterality: N/A;   DILATION AND CURETTAGE OF UTERUS  2011   Endometrial hyperplasia with atypia, submucosal fibroid   HYSTEROSCOPY  2011   WISDOM TOOTH EXTRACTION  1998   OB History  Gravida Para Term Preterm AB Living  0            SAB IAB Ectopic Multiple Live Births             Patient denies any other pertinent gynecologic issues.   No current facility-administered medications on file prior to encounter.   Current Outpatient Medications on File Prior to Encounter  Medication Sig Dispense Refill   Cholecalciferol (SM VITAMIN D3) 100 MCG (4000 UT) CAPS Take 1 capsule (4,000 Units total) by mouth daily. 30 capsule 0   clindamycin (CLEOCIN T) 1 % lotion APPLY TOPICALLY TO AFFECTED AREA TWICE DAILY. 60 mL 2   Dapagliflozin-metFORMIN HCl ER (XIGDUO XR) 08-998 MG TB24 Take 2 tablets by mouth every morning after a meal. 180 tablet 3    lisinopril-hydrochlorothiazide (ZESTORETIC) 10-12.5 MG tablet Take 1 tablet by mouth daily. 90 tablet 3   naproxen sodium (ANAPROX) 550 MG tablet TAKE 1 TABLET (550 MG TOTAL) BY MOUTH 2 (TWO) TIMES DAILY WITH A MEAL AS NEEDED FOR PAIN AND AS INSTRUCTED AROUND PERIODS 30 tablet 2   tirzepatide (MOUNJARO) 10 MG/0.5ML Pen Inject 10 mg into the skin once a week. 6 mL 0   tranexamic acid (LYSTEDA) 650 MG TABS tablet TAKE 2 TABLETS (1,300 MG TOTAL) BY MOUTH 3 (THREE) TIMES DAILY. TAKE DURING MENSES FOR A MAXIMUM OF FIVE DAYS 30 tablet 2   doxycycline (MONODOX) 100 MG capsule Take 1 capsule (100 mg total) by mouth 2 (two) times daily for 7 days 14 capsule 0   ferrous sulfate 325 (65 FE) MG tablet Take 325 mg by mouth daily with breakfast. prn     No Known Allergies  Social History:   reports that she has never smoked. She has never used smokeless tobacco. She reports current alcohol use. She reports that she does not use drugs.  Family History  Problem Relation Age of Onset   Hypertension Mother    Diabetes Mother    Obesity Mother    Hypertension Father    Diabetes Father    Sleep apnea Father  Obesity Father    Diabetes Maternal Uncle    Hypertension Maternal Grandfather    Heart disease Maternal Grandfather    Diabetes Maternal Grandfather    Diabetes Paternal Grandmother    Heart disease Paternal Grandmother    Seizures Sister    Stroke Maternal Aunt    Diabetes Maternal Aunt    Hypertension Maternal Aunt    Colon cancer Neg Hx    Liver disease Neg Hx    Kidney disease Neg Hx     Review of Systems: Pertinent items noted in HPI and remainder of comprehensive ROS otherwise negative.  PHYSICAL EXAM: Blood pressure (!) 155/90, pulse 97, temperature (!) 97.3 F (36.3 C), temperature source Oral, resp. rate 17, height 5\' 4"  (1.626 m), weight 106.2 kg, SpO2 100 %. CONSTITUTIONAL: Well-developed, well-nourished female in no acute distress.  HENT:  Normocephalic, atraumatic, External  right and left ear normal. Oropharynx is clear and moist EYES: Conjunctivae and EOM are normal. Pupils are equal, round, and reactive to light. No scleral icterus.  NECK: Normal range of motion, supple, no masses SKIN: Skin is warm and dry. No rash noted. Not diaphoretic. No erythema. No pallor. NEUROLOGIC: Alert and oriented to person, place, and time. Normal reflexes, muscle tone coordination. No cranial nerve deficit noted. PSYCHIATRIC: Normal mood and affect. Normal behavior. Normal judgment and thought content. CARDIOVASCULAR: Normal heart rate noted, regular rhythm RESPIRATORY: Effort and breath sounds normal, no problems with respiration noted ABDOMEN: Soft, nontender, nondistended. PELVIC: Deferred MUSCULOSKELETAL: Normal range of motion. No edema and no tenderness. 2+ distal pulses.  Labs: 02/12/2021  ENDOMETRIUM, BIOPSY:  - Microscopic fragments with complex atypical hyperplasia and squamous metaplasia.  - Abundant disordered proliferative endometrium.  - See comment.  COMMENT:  There is abundant disordered proliferative endometrium and several microscopic fragments with crowded, irregular and atypical glands with squamous metaplasia.  One fragment has a small focus with a solid growth pattern which likely represents squamous metaplasia; however, carcinoma can not be ruled out in these small fragments.    Results for orders placed or performed during the hospital encounter of 03/03/21 (from the past 336 hour(s))  Pregnancy, urine POC   Collection Time: 03/03/21 12:31 PM  Result Value Ref Range   Preg Test, Ur NEGATIVE NEGATIVE  CBC   Collection Time: 03/03/21  1:00 PM  Result Value Ref Range   WBC 11.2 (H) 4.0 - 10.5 K/uL   RBC 4.28 3.87 - 5.11 MIL/uL   Hemoglobin 10.7 (L) 12.0 - 15.0 g/dL   HCT 34.0 (L) 36.0 - 46.0 %   MCV 79.4 (L) 80.0 - 100.0 fL   MCH 25.0 (L) 26.0 - 34.0 pg   MCHC 31.5 30.0 - 36.0 g/dL   RDW 17.1 (H) 11.5 - 15.5 %   Platelets 483 (H) 150 - 400 K/uL    nRBC 0.0 0.0 - 0.2 %  I-STAT, chem 8   Collection Time: 03/03/21  1:17 PM  Result Value Ref Range   Sodium 137 135 - 145 mmol/L   Potassium 3.5 3.5 - 5.1 mmol/L   Chloride 99 98 - 111 mmol/L   BUN 8 6 - 20 mg/dL   Creatinine, Ser 0.60 0.44 - 1.00 mg/dL   Glucose, Bld 93 70 - 99 mg/dL   Calcium, Ion 1.22 1.15 - 1.40 mmol/L   TCO2 25 22 - 32 mmol/L   Hemoglobin 12.2 12.0 - 15.0 g/dL   HCT 36.0 36.0 - 46.0 %    Imaging Studies: 07/28/2020 TRANSABDOMINAL AND  TRANSVAGINAL ULTRASOUND OF PELVIS  CLINICAL DATA:  History of endometrial hyperplasia TECHNIQUE: Both transabdominal and transvaginal ultrasound examinations of the pelvis were performed. Transabdominal technique was performed for global imaging of the pelvis including uterus, ovaries, adnexal regions, and pelvic cul-de-sac. It was necessary to proceed with endovaginal exam following the transabdominal exam to visualize the ovaries. COMPARISON:  None FINDINGS: Uterus Measurements: 8.4 x 4.5 x 6 cm = volume: 120 mL. A fibroid is noted at the uterine fundus measuring 1.8 x 1.1 x 0.9 cm. This fibroid appears to be intramural in location. Endometrium Thickness: 6 mm. There is an endometrial polyp measuring 1.1 x 0.6 x 0.8 cm. Right ovary Measurements: 3.9 x 2.8 x 2.8 cm = volume: 16 mL. Normal appearance/no adnexal mass. Left ovary Measurements: 3.9 x 2.6 x 3.6 cm = volume: 20 mL. Normal appearance/no adnexal mass. Other findings There is a trace volume of pelvic free fluid. IMPRESSION: 1. Possible endometrial polyp versus endometrial hyperplasia. Gynecologic consultation is recommended. 2. Small uterine fibroid as detailed above. Electronically Signed   By: Constance Holster M.D.   On: 07/28/2020 22:06    Assessment: Principal Problem:   Complex endometrial hyperplasia with atypia Active Problems:   Menorrhagia   Plan: Patient will undergo surgical management with Hysteroscopy, Dilation and Curettage. The risks of surgery were discussed in  detail with the patient including but not limited to: bleeding which may require transfusion; infection which may require antibiotics; injury to uterus or surrounding organs; intrauterine scarring; need for additional procedures due to complications including laparotomy or laparoscopy, or subsequent procedures secondary to abnormal pathology; and other postoperative/anesthesia complications. Likelihood of success in alleviating the patient's condition was discussed.  Discussed that further management will be considered pending pathology results. Routine postoperative instructions will be reviewed with the patient and her family in detail after surgery.  The patient concurred with the proposed plan, giving informed written consent for the surgery.  Patient has been NPO since last night and she will remain NPO for procedure.  Anesthesia and OR aware.  Preoperative SCDs ordered on call to the OR.  To OR when ready.    Verita Schneiders, MD, [place] for [employer], [place]

## 2021-03-03 NOTE — Anesthesia Preprocedure Evaluation (Addendum)
Anesthesia Evaluation  Patient identified by MRN, date of birth, ID band Patient awake    Reviewed: Allergy & Precautions, NPO status , Patient's Chart, lab work & pertinent test results  Airway Mallampati: II  TM Distance: >3 FB Neck ROM: Full    Dental  (+) Teeth Intact, Dental Advisory Given   Pulmonary sleep apnea and Continuous Positive Airway Pressure Ventilation ,    breath sounds clear to auscultation       Cardiovascular hypertension, Pt. on medications  Rhythm:Regular Rate:Normal     Neuro/Psych PSYCHIATRIC DISORDERS Depression    GI/Hepatic negative GI ROS, Neg liver ROS,   Endo/Other  diabetes  Renal/GU negative Renal ROS     Musculoskeletal negative musculoskeletal ROS (+)   Abdominal Normal abdominal exam  (+)   Peds  Hematology   Anesthesia Other Findings   Reproductive/Obstetrics                            Anesthesia Physical Anesthesia Plan  ASA: 3  Anesthesia Plan: General   Post-op Pain Management:    Induction: Intravenous  PONV Risk Score and Plan: 4 or greater and Ondansetron, Dexamethasone, Midazolam and Scopolamine patch - Pre-op  Airway Management Planned: LMA  Additional Equipment: None  Intra-op Plan:   Post-operative Plan: Extubation in OR  Informed Consent: I have reviewed the patients History and Physical, chart, labs and discussed the procedure including the risks, benefits and alternatives for the proposed anesthesia with the patient or authorized representative who has indicated his/her understanding and acceptance.     Dental advisory given  Plan Discussed with: CRNA  Anesthesia Plan Comments:        Anesthesia Quick Evaluation

## 2021-03-03 NOTE — Op Note (Addendum)
PREOPERATIVE DIAGNOSIS:  Recurrent complex endometrial hyperplasia with atypia, menorrhagia POSTOPERATIVE DIAGNOSIS: The same PROCEDURE: Hysteroscopy, Dilation and Curettage. SURGEON:  Dr. Verita Schneiders  INDICATIONS: 40 y.o. G0P0  here for scheduled surgery for the aforementioned diagnoses.   Risks of surgery were discussed with the patient including but not limited to: bleeding which may require transfusion; infection which may require antibiotics; injury to uterus or surrounding organs; intrauterine scarring; need for additional procedures including laparotomy or laparoscopy; and other postoperative/anesthesia complications. Written informed consent was obtained.    FINDINGS:  A 9 week size uterus.  Diffuse proliferative endometrium.  Normal ostia bilaterally. Patient has known second degree uterine prolapse; cervix is visible at introitus when patient is placed in dorsal lithotomy position.  ANESTHESIA:   General, paracervical block with 30 ml of 0.5% Marcaine ESTIMATED BLOOD LOSS:  50 ml SPECIMENS: Endometrial curettings sent to pathology COMPLICATIONS:  None immediate.  PROCEDURE DETAILS:  The patient was then taken to the operating room where general anesthesia was administered and was found to be adequate.  After an adequate timeout was performed, she was placed in the dorsal lithotomy position and examined; then prepped and draped in the sterile manner.   A Sims retractor was then placed in the patient's vagina under the cervix which is at the introitus due to her uterine prolapse.  A single tooth tenaculum was applied to the anterior lip of the cervix.   A paracervical block using 30 ml of 0.5% Marcaine was administered.  The cervix was noted to be have a already open os, and was adequately dilated to accommodate the 5 mm diagnostic hysteroscope.  The hysteroscope was then inserted under direct visualization using NS as a suspension medium.  The uterine cavity was carefully examined with the  findings as noted above.   After further careful visualization of the uterine cavity, the hysteroscope was removed under direct visualization.  A detailed sharp curettage was then performed to obtain a moderate amount of endometrial curettings.  The tenaculum was removed from the anterior lip of the cervix and the vaginal speculum was removed after noting good hemostasis.  The patient tolerated the procedure well and was taken to the recovery area awake, extubated and in stable condition.  The patient will be discharged to home as per PACU criteria.  Routine postoperative instructions given.  She was prescribed Oxycodone, Ibuprofen and Colace.  She will follow up in the office in 2-3 weeks  for postoperative evaluation.   Verita Schneiders, MD, [place] for [employer], [place]

## 2021-03-03 NOTE — Anesthesia Postprocedure Evaluation (Signed)
Anesthesia Post Note  Patient: Cathy Ho  Procedure(s) Performed: DILATATION AND CURETTAGE /HYSTEROSCOPY (Vagina )     Patient location during evaluation: PACU Anesthesia Type: General Level of consciousness: awake and alert Pain management: pain level controlled Vital Signs Assessment: post-procedure vital signs reviewed and stable Respiratory status: spontaneous breathing, nonlabored ventilation, respiratory function stable and patient connected to nasal cannula oxygen Cardiovascular status: blood pressure returned to baseline and stable Postop Assessment: no apparent nausea or vomiting Anesthetic complications: no   No notable events documented.  Last Vitals:  Vitals:   03/03/21 1556 03/03/21 1629  BP:  132/80  Pulse: 70 76  Resp: (!) 26 (!) 22  Temp:    SpO2: 98% 99%    Last Pain:  Vitals:   03/03/21 1629  TempSrc:   PainSc: 0-No pain                 Effie Berkshire

## 2021-03-03 NOTE — Discharge Instructions (Signed)

## 2021-03-04 ENCOUNTER — Ambulatory Visit (INDEPENDENT_AMBULATORY_CARE_PROVIDER_SITE_OTHER): Payer: No Typology Code available for payment source | Admitting: Physician Assistant

## 2021-03-04 ENCOUNTER — Other Ambulatory Visit (HOSPITAL_COMMUNITY): Payer: Self-pay

## 2021-03-04 ENCOUNTER — Encounter (HOSPITAL_BASED_OUTPATIENT_CLINIC_OR_DEPARTMENT_OTHER): Payer: Self-pay | Admitting: Obstetrics & Gynecology

## 2021-03-04 ENCOUNTER — Other Ambulatory Visit: Payer: Self-pay

## 2021-03-04 DIAGNOSIS — L910 Hypertrophic scar: Secondary | ICD-10-CM

## 2021-03-04 DIAGNOSIS — D485 Neoplasm of uncertain behavior of skin: Secondary | ICD-10-CM

## 2021-03-04 LAB — GLUCOSE, CAPILLARY: Glucose-Capillary: 72 mg/dL (ref 70–99)

## 2021-03-04 LAB — SURGICAL PATHOLOGY

## 2021-03-04 MED ORDER — MUPIROCIN 2 % EX OINT
1.0000 "application " | TOPICAL_OINTMENT | Freq: Two times a day (BID) | CUTANEOUS | 6 refills | Status: DC
  Filled 2021-03-04: qty 22, 10d supply, fill #0
  Filled 2021-10-25: qty 22, 10d supply, fill #1

## 2021-03-04 MED ORDER — ALCLOMETASONE DIPROPIONATE 0.05 % EX CREA
1.0000 | TOPICAL_CREAM | Freq: Two times a day (BID) | CUTANEOUS | 3 refills | Status: DC | PRN
Start: 2021-03-04 — End: 2022-10-31
  Filled 2021-03-04: qty 180, 90d supply, fill #0
  Filled 2021-03-05: qty 60, 30d supply, fill #0

## 2021-03-04 MED ORDER — TRIAMCINOLONE ACETONIDE 10 MG/ML IJ SUSP
10.0000 mg | Freq: Once | INTRAMUSCULAR | Status: AC
Start: 2021-03-04 — End: 2021-03-04
  Administered 2021-03-04: 10 mg

## 2021-03-04 NOTE — Progress Notes (Signed)
Attempted to call patient twice, but it went to voicemail.  Patient has already seen the pathology report.  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 Patient needs to be referred to [facility] for further evaluation and management given this concerning pathology result.  I left a voicemail message for patient indicating that this referral will occur.   Verita Schneiders, MD

## 2021-03-05 ENCOUNTER — Other Ambulatory Visit: Payer: Self-pay

## 2021-03-05 ENCOUNTER — Encounter: Payer: Self-pay | Admitting: Gynecologic Oncology

## 2021-03-05 ENCOUNTER — Other Ambulatory Visit (HOSPITAL_COMMUNITY): Payer: Self-pay

## 2021-03-05 ENCOUNTER — Telehealth: Payer: Self-pay | Admitting: *Deleted

## 2021-03-05 DIAGNOSIS — N8502 Endometrial intraepithelial neoplasia [EIN]: Secondary | ICD-10-CM

## 2021-03-05 NOTE — Telephone Encounter (Signed)
Spoke with the patient and scheduled a new patient appt for 11/14 with Dr Berline Lopes at 9 am. Patient given an arrival time of 8:30 am. Patient given the address and phone number for the clinic; along with the policy for mask and visitors

## 2021-03-05 NOTE — Progress Notes (Signed)
Referral placed per Anyanwu, MD. GYN/ONC called by Colletta Maryland, RN.

## 2021-03-05 NOTE — Telephone Encounter (Signed)
     Faculty Practice OB/GYN Physician Phone Call Documentation  I had a phone conversation with Cathy Ho about her pathology results.   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 See comment.  Comment:  Focal areas are concerning for endometrioid adenocarcinoma.  However, evaluation is limited by extensive breakdown, tissue fragmentation, and squamous morular metaplasia.   This is very concerning and needs management.  Patient wants to discuss options of uterine preservation, she was told that she will be referred to GYN Oncology to discuss further management. She was told that the usual recommended management was hysterectomy at this point, but will defer discussion about all management modalities to the expertise of GYN ONC.  She will be contacted with details about her appointment with GYN Oncology. All questions answered, support given to patient.    Verita Schneiders, MD, [place] for [employer], [place]

## 2021-03-08 ENCOUNTER — Inpatient Hospital Stay: Payer: No Typology Code available for payment source | Attending: Gynecologic Oncology | Admitting: Gynecologic Oncology

## 2021-03-08 ENCOUNTER — Other Ambulatory Visit: Payer: Self-pay | Admitting: Gynecologic Oncology

## 2021-03-08 ENCOUNTER — Encounter: Payer: Self-pay | Admitting: Gynecologic Oncology

## 2021-03-08 ENCOUNTER — Inpatient Hospital Stay (HOSPITAL_BASED_OUTPATIENT_CLINIC_OR_DEPARTMENT_OTHER): Payer: No Typology Code available for payment source | Admitting: Gynecologic Oncology

## 2021-03-08 ENCOUNTER — Other Ambulatory Visit: Payer: Self-pay

## 2021-03-08 ENCOUNTER — Other Ambulatory Visit (HOSPITAL_COMMUNITY): Payer: Self-pay

## 2021-03-08 ENCOUNTER — Encounter: Payer: Self-pay | Admitting: Physician Assistant

## 2021-03-08 VITALS — BP 124/74 | HR 75 | Temp 98.4°F | Resp 18 | Ht 64.0 in | Wt 236.8 lb

## 2021-03-08 DIAGNOSIS — G4733 Obstructive sleep apnea (adult) (pediatric): Secondary | ICD-10-CM | POA: Insufficient documentation

## 2021-03-08 DIAGNOSIS — N8502 Endometrial intraepithelial neoplasia [EIN]: Secondary | ICD-10-CM

## 2021-03-08 DIAGNOSIS — Z6841 Body Mass Index (BMI) 40.0 and over, adult: Secondary | ICD-10-CM

## 2021-03-08 DIAGNOSIS — E119 Type 2 diabetes mellitus without complications: Secondary | ICD-10-CM | POA: Diagnosis not present

## 2021-03-08 DIAGNOSIS — Z7984 Long term (current) use of oral hypoglycemic drugs: Secondary | ICD-10-CM | POA: Diagnosis not present

## 2021-03-08 DIAGNOSIS — Z79899 Other long term (current) drug therapy: Secondary | ICD-10-CM | POA: Insufficient documentation

## 2021-03-08 DIAGNOSIS — D259 Leiomyoma of uterus, unspecified: Secondary | ICD-10-CM | POA: Diagnosis not present

## 2021-03-08 DIAGNOSIS — E559 Vitamin D deficiency, unspecified: Secondary | ICD-10-CM | POA: Insufficient documentation

## 2021-03-08 DIAGNOSIS — E282 Polycystic ovarian syndrome: Secondary | ICD-10-CM | POA: Diagnosis not present

## 2021-03-08 DIAGNOSIS — Z8616 Personal history of COVID-19: Secondary | ICD-10-CM | POA: Diagnosis not present

## 2021-03-08 DIAGNOSIS — I10 Essential (primary) hypertension: Secondary | ICD-10-CM | POA: Insufficient documentation

## 2021-03-08 DIAGNOSIS — L732 Hidradenitis suppurativa: Secondary | ICD-10-CM | POA: Diagnosis not present

## 2021-03-08 MED ORDER — IBUPROFEN 800 MG PO TABS
800.0000 mg | ORAL_TABLET | Freq: Three times a day (TID) | ORAL | 0 refills | Status: DC | PRN
Start: 2021-03-08 — End: 2022-01-17
  Filled 2021-03-08: qty 30, 10d supply, fill #0

## 2021-03-08 MED ORDER — SENNOSIDES-DOCUSATE SODIUM 8.6-50 MG PO TABS
2.0000 | ORAL_TABLET | Freq: Every day | ORAL | 0 refills | Status: DC
  Filled 2021-03-08: qty 30, 15d supply, fill #0

## 2021-03-08 NOTE — Progress Notes (Signed)
   New Patient   Subjective  Cathy Ho is a 40 y.o. female who presents for the following: Keloid (Right side of neck after cyst surgery in 2019).   The following portions of the chart were reviewed this encounter and updated as appropriate:  Tobacco  Allergies  Meds  Problems  Med Hx  Surg Hx  Fam Hx      Objective  Well appearing patient in no apparent distress; mood and affect are within normal limits.  All skin waist up examined.  Neck - Anterior Large, thick hypertrophic scar.      Neck - Anterior      Assessment & Plan  Neoplasm of uncertain behavior of skin Neck - Anterior  Skin / nail biopsy Type of biopsy: tangential   Informed consent: discussed and consent obtained   Timeout: patient name, date of birth, surgical site, and procedure verified   Procedure prep:  Patient was prepped and draped in usual sterile fashion (Non sterile) Prep type:  Chlorhexidine Anesthesia: the lesion was anesthetized in a standard fashion   Anesthetic:  1% lidocaine w/ epinephrine 1-100,000 local infiltration Instrument used: flexible razor blade   Outcome: patient tolerated procedure well   Post-procedure details: wound care instructions given    mupirocin ointment (BACTROBAN) 2 % Apply 1 application topically 2 (two) times daily.  Specimen 1 - Surgical pathology Differential Diagnosis: r/o keloid  Check Margins: No  Keloid Neck - Anterior  Intralesional injection - Neck - Anterior Location: neck anterior  Informed Consent: Discussed risks (infection, pain, bleeding, bruising, thinning of the skin, loss of skin pigment, lack of resolution, and recurrence of lesion) and benefits of the procedure, as well as the alternatives. Informed consent was obtained. Preparation: The area was prepared a standard fashion.  Anesthesia:Lidocaine 2% with epinephrine  Procedure Details: An intralesional injection was performed with Kenalog 10 mg/cc. 1 cc in total were  injected.  Total number of injections: 6  Plan: The patient was instructed on post-op care. Recommend OTC analgesia as needed for pain.   alclomethasone (ACLOVATE) 0.05 % cream - Neck - Anterior Apply 1 application topically 1 (one) to 2 (two) times daily as needed for rash.  mupirocin ointment (BACTROBAN) 2 % - Neck - Anterior Apply 1 application topically 2 (two) times daily.  Related Medications triamcinolone acetonide (KENALOG) 10 MG/ML injection 10 mg      I, Clay Solum, PA-C, have reviewed all documentation's for this visit.  The documentation on 03/08/21 for the exam, diagnosis, procedures and orders are all accurate and complete.

## 2021-03-08 NOTE — Patient Instructions (Signed)
[facility] is the office in [place] that you can have an inperson or virtual visit.  [facility] is the group in [place]. There number is [phone]. We are happy to send a referral if you need.   Preparing for your Surgery   Plan for surgery with Dr. Jeral Pinch at [facility] will be scheduled for robotic assisted total laparoscopic hysterectomy (removal of the uterus and cervix), bilateral salpingectomy (removal of both fallopian tubes), sentinel lymph node biopsy, possible lymph node dissection, possible laparotomy (larger incision if needed on the abdomen).    Given the prolapse noted on exam today, please let us know whether you would like to meet with a urologist for evaluation for possible surgery at the same time as the surgery above.    Please call [phone] when you have decided on a surgery date. If you decide to meet with urology for possible joint surgery, the date may be subject to change.   Pre-operative Testing -You will receive a phone call from presurgical testing at [facility] to arrange for a pre-operative appointment and lab work.   -Bring your insurance card, copy of an advanced directive if applicable, medication list   -At that visit, you will be asked to sign a consent for a possible blood transfusion in case a transfusion becomes necessary during surgery.  The need for a blood transfusion is rare but having consent is a necessary part of your care.      -You should not be taking blood thinners or aspirin at least ten days prior to surgery unless instructed by your surgeon.   -Do not take supplements such as fish oil (omega 3), red yeast rice, turmeric before your surgery. You want to avoid medications with aspirin in them including headache powders such as BC or Goody's), Excedrin migraine.   Day Before Surgery at [facility] will be asked to take in a light diet the day before surgery.  You will be advised you can have clear liquids up until 3 hours before your surgery.     Eat a light diet the day before surgery.  Examples including soups, broths, toast, yogurt, mashed potatoes.  AVOID GAS PRODUCING FOODS. Things to avoid include carbonated beverages (fizzy beverages, sodas), raw fruits and raw vegetables (uncooked), or beans.    If your bowels are filled with gas, your surgeon will have difficulty visualizing your pelvic organs which increases your surgical risks.   Your role in recovery Your role is to become active as soon as directed by your doctor, while still giving yourself time to heal.  Rest when you feel tired. You will be asked to do the following in order to speed your recovery:   - Cough and breathe deeply. This helps to clear and expand your lungs and can prevent pneumonia after surgery.  - [place]. Do mild physical activity. Walking or moving your legs help your circulation and body functions return to normal. Do not try to get up or walk alone the first time after surgery.   -If you develop swelling on one leg or the other, pain in the back of your leg, redness/warmth in one of your legs, please call the office or go to the Emergency Room to have a doppler to rule out a blood clot. For shortness of breath, chest pain-seek care in the Emergency Room as soon as possible. - Actively manage your pain. Managing your pain  lets you move in comfort. We will ask you to rate your pain on a scale of zero to 10. It is your responsibility to tell your doctor or nurse where and how much you hurt so your pain can be treated.   Special Considerations -If you are diabetic, you may be placed on insulin after surgery to have closer control over your blood sugars to promote healing and recovery.  This does not mean that you will be discharged on insulin.  If applicable, your oral antidiabetics will be resumed when you are tolerating a solid diet.   -Your final  pathology results from surgery should be available around one week after surgery and the results will be relayed to you when available.   -Dr. Lahoma Crocker is the surgeon that assists your GYN Oncologist with surgery.  If you end up staying the night, the next day after your surgery you will either see Dr. Denman George, Dr. Berline Lopes, or Dr. Lahoma Crocker.   -FMLA forms can be faxed to [phone] and please allow 5-7 business days for completion.   Pain Management After Surgery -You have been prescribed ibuprofen and bowel regimen medications before surgery so that you can have these available when you are discharged from the hospital. You can use the oxycodone at home for severe pain if needed.   -Make sure that you have Tylenol and Ibuprofen at home to use on a regular basis after surgery for pain control. We recommend alternating the medications every hour to six hours since they work differently and are processed in the body differently for pain relief.   -Review the attached handout on narcotic use and their risks and side effects.    Bowel Regimen -You have been prescribed Sennakot-S to take nightly to prevent constipation especially if you are taking the narcotic pain medication intermittently.  It is important to prevent constipation and drink adequate amounts of liquids. You can stop taking this medication when you are not taking pain medication and you are back on your normal bowel routine.   Risks of Surgery Risks of surgery are low but include bleeding, infection, damage to surrounding structures, re-operation, blood clots, and very rarely death.     Blood Transfusion Information (For the consent to be signed before surgery)   We will be checking your blood type before surgery so in case of emergencies, we will know what type of blood you would need.                                             WHAT IS A BLOOD TRANSFUSION?   A transfusion is the replacement of blood or some of  its parts. Blood is made up of multiple cells which provide different functions. Red blood cells carry oxygen and are used for blood loss replacement. White blood cells fight against infection. Platelets control bleeding. Plasma helps clot blood. Other blood products are available for specialized needs, such as hemophilia or other clotting disorders. BEFORE THE TRANSFUSION  Who gives blood for transfusions?  You may be able to donate blood to be used at a later date on yourself (autologous donation). Relatives can be asked to donate blood. This is generally not any safer than if you have received blood from a stranger. The same precautions are taken to ensure safety when a relative's blood is donated. Healthy volunteers who are fully  evaluated to make sure their blood is safe. This is blood bank blood. Transfusion therapy is the safest it has ever been in the practice of medicine. Before blood is taken from a donor, a complete history is taken to make sure that person has no history of diseases nor engages in risky social behavior (examples are intravenous drug use or sexual activity with multiple partners). The donor's travel history is screened to minimize risk of transmitting infections, such as malaria. The donated blood is tested for signs of infectious diseases, such as HIV and hepatitis. The blood is then tested to be sure it is compatible with you in order to minimize the chance of a transfusion reaction. If you or a relative donates blood, this is often done in anticipation of surgery and is not appropriate for emergency situations. It takes many days to process the donated blood. RISKS AND COMPLICATIONS Although transfusion therapy is very safe and saves many lives, the main dangers of transfusion include:  Getting an infectious disease. Developing a transfusion reaction. This is an allergic reaction to something in the blood you were given. Every precaution is taken to prevent this. The  decision to have a blood transfusion has been considered carefully by your caregiver before blood is given. Blood is not given unless the benefits outweigh the risks.   AFTER SURGERY INSTRUCTIONS   Return to work: 4-6 weeks if applicable   Activity: 1. Be up and out of the bed during the day.  Take a nap if needed.  You may walk up steps but be careful and use the hand rail.  Stair climbing will tire you more than you think, you may need to stop part way and rest.    2. No lifting or straining for 6 weeks over 10 pounds. No pushing, pulling, straining for 6 weeks.   3. No driving for 1 week(s).  Do not drive if you are taking narcotic pain medicine and make sure that your reaction time has returned.    4. You can shower as soon as the next day after surgery. Shower daily.  Use your regular soap and water (not directly on the incision) and pat your incision(s) dry afterwards; don't rub.  No tub baths or submerging your body in water until cleared by your surgeon. If you have the soap that was given to you by pre-surgical testing that was used before surgery, you do not need to use it afterwards because this can irritate your incisions.    5. No sexual activity and nothing in the vagina for 8 weeks.   6. You may experience a small amount of clear drainage from your incisions, which is normal.  If the drainage persists, increases, or changes color please call the office.   7. Do not use creams, lotions, or ointments such as neosporin on your incisions after surgery until advised by your surgeon because they can cause removal of the dermabond glue on your incisions.     8. You may experience vaginal spotting after surgery or around the 6-8 week mark from surgery when the stitches at the top of the vagina begin to dissolve.  The spotting is normal but if you experience heavy bleeding, call our office.   9. Take Tylenol or ibuprofen (or naproxen, do not take together since they work similarly) first  for pain and only use narcotic pain medication (oxycodone you have at home) for severe pain not relieved by the Tylenol or Ibuprofen.  Monitor your Tylenol  intake to a max of 4,000 mg in a 24 hour period. You can alternate these medications after surgery.   Diet: 1. Low sodium Heart Healthy Diet is recommended but you are cleared to resume your normal (before surgery) diet after your procedure.   2. It is safe to use a laxative, such as Miralax or Colace, if you have difficulty moving your bowels. You have been prescribed Sennakot at bedtime every evening to keep bowel movements regular and to prevent constipation.     Wound Care: 1. Keep clean and dry.  Shower daily.   Reasons to call the Doctor: Fever - Oral temperature greater than 100.4 degrees Fahrenheit Foul-smelling vaginal discharge Difficulty urinating Nausea and vomiting Increased pain at the site of the incision that is unrelieved with pain medicine. Difficulty breathing with or without chest pain New calf pain especially if only on one side Sudden, continuing increased vaginal bleeding with or without clots.   Contacts: For questions or concerns you should contact:   Dr. Jeral Pinch at [phone]   Joylene John, NP at [phone]   After Hours: call [phone] and have the GYN Oncologist paged/contacted (after 5 pm or on the weekends).   Messages sent via mychart are for non-urgent matters and are not responded to after hours so for urgent needs, please call the after hours number.

## 2021-03-08 NOTE — Progress Notes (Signed)
GYNECOLOGIC ONCOLOGY NEW PATIENT CONSULTATION   Patient Name: Cathy Ho  Patient Age: 40 y.o. 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    We reviewed the role of progesterone therapy and the effect on preneoplastic lesions, believed to include induction of apoptosis in addition to tissue sloughing during withdrawal bleeding.  We discussed that this can be accomplished with the Mirena IUD and well as high dose progesterone.  With IUD use, we anticipate that the average duration to regression is 4.5 months, with all cases anticipated to have regression by 9 months.  Biopsies would need to be taken every 3 to 6 months to evaluate for response to therapy. At least 2-3 negative biopsies would be necessary before progesterone therapy could be stopped to attempt pregnancy.    The patient is a suitable candidate for surgery. She had previously wanted to retain her uterus to preserve fertility. She continues to desires fertility but has decided that she is ready and wanting to move forward with surgery.   The patient is a suitable candidate for hysterectomy via a minimally invasive approach to surgery. Given that she is postmenopausal, a bilateral salpingo-oophorectomy is also recommended.  We reviewed that robotic assistance would be used to complete the surgery.  We discussed that endometrial cancer is detected in about 40% of final uterine pathology specimens from patients with CAH.  We discussed two management options at the time of surgery. One would be for sentinel lymph node biopsy. If no cancer diagnosed, this would be over treatment. The other option is  that we could plan to send the uterus for intraoperative frozen pathology.  If cancer is identified at the time of surgery, additional procedures including lymph node evaluation, is recommended. After discussing risks and benefits of both, the patient would like to proceed with SLN biopsy.  We reviewed the sentinel lymph node technique. Risks and benefits of sentinel lymph node biopsy was reviewed. We reviewed the technique and ICG dye. The patient DOES NOT have an iodine allergy or known liver dysfunction. We reviewed the false negative rate (0.4%), and that 3% of patients with metastatic disease will not have it detected by SLN biopsy in endometrial cancer. A low risk of allergic reaction to the dye, <0.2% for ICG, has been reported. We also discussed that in the case of failed mapping, which occurs 40% of the time, a bilateral or unilateral lymphadenectomy will be performed at the surgeon's discretion.   Potential benefits of sentinel nodes including a higher detection rate for metastasis due to ultrastaging and potential reduction in operative morbidity. However, there remains uncertainty as to the role for treatment of micrometastatic disease. Further, the benefit of operative morbidity associated with the SLN technique in endometrial cancer is not yet completely known. In other patient populations (e.g. the cervical cancer population) there has been observed reductions in morbidity with SLN biopsy compared to pelvic lymphadenectomy. Lymphedema, nerve dysfunction and lymphocysts are all potential risks with the SLN technique as with complete lymphadenectomy. Additional risks to the patient include the risk of damage to an internal organ while operating in an altered view (e.g. the black and white image of the robotic fluorescence imaging mode).   We discussed  plan for a robotic assisted hysterectomy, bilateral salpingectomy, sentinel lymph node evaluation, possible lymph node dissection, possible  laparotomy. Given young age and data that no impact on oncologic outcomes, I recommend that ovaries be left in situ if normal in appearance. The risks of surgery were discussed in detail and she understands these to include infection; wound separation; hernia; vaginal cuff separation, injury to adjacent organs such as bowel, bladder, blood vessels, ureters and nerves; bleeding which may require blood transfusion; anesthesia risk; thromboembolic events; possible death; unforeseen complications; possible need for re-exploration; medical complications such as heart attack, stroke, pleural effusion and pneumonia; and, if full lymphadenectomy is performed the risk of lymphedema and lymphocyst. The patient will receive DVT and antibiotic prophylaxis as indicated. She voiced a clear understanding. She had the opportunity to ask questions. Perioperative instructions were reviewed with her. Prescriptions for post-op medications were sent to her pharmacy of choice.  Given symptomatic prolapse, I offered referral to Urology to discuss possible joint surgery. Patient was interested in this. If surgery needs to be delayed for more than 2 months, we discussed oral progesterone therapy until surgery.  Given patient voicing desire for pregnancy, I offered referral to REI. We discussed that pregnancy can be accomplished without a uterus using her own eggs and a surrogate. We also discussed option of adoption. I gave her the information for REI practice in [place] as well as [facility] in [place].   A copy of this note was sent to the patient's referring provider.   82 minutes of total time was spent for this patient encounter, including preparation, face-to-face counseling with the patient and coordination of care, and documentation of the encounter.   Jeral Pinch, MD  Division of Gynecologic Oncology  Department of Obstetrics and Gynecology  [facility]: [facility]   ___________________________________________  Chief Complaint: Chief Complaint  Patient presents with   EIN (endometrial intraepithelial neoplasia)    History of Present Illness:  Cathy Ho is a 40 y.o. y.o. female who is seen in consultation at the request of Dr. Harolyn Rutherford for an evaluation of at least EIN.  Patient reports a long history of irregular cycles before achieving weight loss.  She describes this as having very irregular menses, often going multiple months between episodes of bleeding, menses lasting for more than 5 days, and heavy bleeding.  Since the beginning of 2020, the patient has been able to achieve about 80 pounds of weight loss through the health and wellness program here at [facility].  This has been mostly related to diet and medication changes.  With her weight loss, she has had normalization of her menses with periods that now occur on average every month that are more predictable in terms of their duration and quantity.  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  Patient had a Mirena IUD placed at that time, in [place], and a subsequent biopsy in the fall 2011 was negative for any hyperplasia, atypia, or malignancy.  Patient notes that she had multiple Mirena IUDs placed but that the IUD "would not stay in".  While some records suggest that she was on high-dose progesterone, the patient does not remember taking oral progesterone.  It sounds like she has not had treatment for CAH in recent years.  She does have a prescription for Lysteda that she uses for heavy bleeding with her cycles.  Most recently, EMB on 10/21 showed microscopic fragments with CAH, abundant disordered proliferative  endometrium. The patient then underwent D&C with findings of EIN, underlying cancer could not be ruled out.  Patient reports having a good appetite without nausea or emesis.  She endorses normal bowel and bladder function.  Medical history is notable  for well-controlled type 2 diabetes.  Most recent hemoglobin A1c was 5.6% and fasting CBGs are less than 100 when she checks them at home.  Patient also has hypertension, obstructive sleep apnea (has a CPAP although does not use it regularly), and hidradenitis suppurativa.  Patient has both a maternal cousin and maternal aunt with a history of breast cancer.  She denies any family history of GYN or colon cancer.  Patient lives in [place] with her husband.  She denies any tobacco use, reports occasional alcohol use.  PAST MEDICAL HISTORY:  Past Medical History:  Diagnosis Date   Anemia    Back pain    Chest pain    COVID 08/2020   Runny nose, fever, HA symptoms resolved   Diabetes mellitus without complication ([place]) 3299   diet and exercise only   Fibroids 2010   Hidradenitis suppurativa    Hypertension    Joint pain    Low iron    PCOS (polycystic ovarian syndrome)    Prolapsed uterus 2010   Sleep apnea    cpap, pt does not know settings   Vitamin D deficiency    Wears glasses      PAST SURGICAL HISTORY:  Past Surgical History:  Procedure Laterality Date   carbuckle cyst in back removed  02/2005   COLONOSCOPY N/A 08/15/2013   Procedure: COLONOSCOPY;  Surgeon: Inda Castle, MD;  Location: [facility];  Service: Endoscopy;  Laterality: N/A;   DILATION AND CURETTAGE OF UTERUS  2011   Endometrial hyperplasia with atypia, submucosal fibroid   HYSTEROSCOPY  2011   HYSTEROSCOPY WITH D & C N/A 03/03/2021   Procedure: DILATATION AND CURETTAGE /HYSTEROSCOPY;  Surgeon: Osborne Oman, MD;  Location: [place] [facility];  Service: Gynecology;  Laterality: N/A;   WISDOM TOOTH EXTRACTION  1998    OB/GYN HISTORY:  OB History  Gravida Para Term Preterm AB Living  0 0          SAB IAB Ectopic Multiple Live Births               No LMP recorded.  Age at menarche: 60 Age at menopause: n/a Hx of HRT: n/a Hx of STDs: Yes Last pap: 06/2020 - negative, HR HPV  negative History of abnormal pap smears: Yes  SCREENING STUDIES:  Last mammogram: 01/2020  Last colonoscopy: 2015  MEDICATIONS: Outpatient Encounter Medications as of 03/08/2021  Medication Sig   alclomethasone (ACLOVATE) 0.05 % cream Apply 1 application topically 1 (one) to 2 (two) times daily as needed for rash.   Cholecalciferol (SM VITAMIN D3) 100 MCG (4000 UT) CAPS Take 1 capsule (4,000 Units total) by mouth daily.   clindamycin (CLEOCIN T) 1 % lotion APPLY TOPICALLY TO AFFECTED AREA TWICE DAILY.   Dapagliflozin-metFORMIN HCl ER (XIGDUO XR) 08-998 MG TB24 Take 2 tablets by mouth every morning after a meal.   docusate sodium (COLACE) 100 MG capsule Take 1 capsule (100 mg total) by mouth 2 (two) times daily as needed for mild constipation or moderate constipation.   ferrous sulfate 325 (65 FE) MG tablet Take 325 mg by mouth daily with breakfast. prn   ibuprofen (ADVIL) 800 MG tablet Take 1 tablet (800 mg total) by mouth every 8 (eight) hours  as needed for moderate pain. For AFTER surgery only   lisinopril-hydrochlorothiazide (ZESTORETIC) 10-12.5 MG tablet Take 1 tablet by mouth daily.   mupirocin ointment (BACTROBAN) 2 % Apply 1 application topically 2 (two) times daily.   naproxen sodium (ANAPROX) 550 MG tablet Take 1 tablet (550 mg total) by mouth 2 (two) times daily with a meal as needed for pain and as instructed around periods   oxyCODONE (OXY IR/ROXICODONE) 5 MG immediate release tablet Take 1 tablet (5 mg total) by mouth every 4 (four) hours as needed for severe pain or breakthrough pain.   senna-docusate (SENOKOT-S) 8.6-50 MG tablet Take 2 tablets by mouth at bedtime. For AFTER surgery, do not take if having diarrhea   tirzepatide (MOUNJARO) 10 MG/0.5ML Pen Inject 10 mg into the skin once a week.   tranexamic acid (LYSTEDA) 650 MG TABS tablet TAKE 2 TABLETS (1,300 MG TOTAL) BY MOUTH 3 (THREE) TIMES DAILY. TAKE DURING MENSES FOR A MAXIMUM OF FIVE DAYS   No facility-administered  encounter medications on file as of 03/08/2021.    ALLERGIES:  No Known Allergies   FAMILY HISTORY:  Family History  Problem Relation Age of Onset   Hypertension Mother    Diabetes Mother    Obesity Mother    Hypertension Father    Diabetes Father    Sleep apnea Father    Obesity Father    Seizures Sister    Stroke Maternal Aunt    Diabetes Maternal Aunt    Hypertension Maternal Aunt    Diabetes Maternal Uncle    Hypertension Maternal Grandfather    Heart disease Maternal Grandfather    Diabetes Maternal Grandfather    Diabetes Paternal Grandmother    Heart disease Paternal Grandmother    Breast cancer Cousin    Colon cancer Neg Hx    Liver disease Neg Hx    Kidney disease Neg Hx    Ovarian cancer Neg Hx    Endometrial cancer Neg Hx    Pancreatic cancer Neg Hx    Prostate cancer Neg Hx      SOCIAL HISTORY:  Social Connections: Not on file    REVIEW OF SYSTEMS:  Denies appetite changes, fevers, chills, fatigue, unexplained weight changes. Denies hearing loss, neck lumps or masses, mouth sores, ringing in ears or voice changes. Denies cough or wheezing.  Denies shortness of breath. Denies chest pain or palpitations. Denies leg swelling. Denies abdominal distention, pain, blood in stools, constipation, diarrhea, nausea, vomiting, or early satiety. Denies pain with intercourse, dysuria, frequency, hematuria or incontinence. Denies hot flashes, pelvic pain, vaginal bleeding or vaginal discharge.   Denies joint pain, back pain or muscle pain/cramps. Denies itching, rash, or wounds. Denies dizziness, headaches, numbness or seizures. Denies swollen lymph nodes or glands, denies easy bruising or bleeding. Denies anxiety, depression, confusion, or decreased concentration.  Physical Exam:  Vital Signs for this encounter:  Blood pressure 124/74, pulse 75, temperature 98.4 F (36.9 C), temperature source Tympanic, resp. rate 18, height 5\' 4"  (1.626 m), weight 236 lb 12.8  oz (107.4 kg), SpO2 100 %. Body mass index is 40.65 kg/m. General: Alert, oriented, no acute distress.  HEENT: Normocephalic, atraumatic. Sclera anicteric.  Chest: Clear to auscultation bilaterally. No wheezes, rhonchi, or rales. Cardiovascular: Regular rate and rhythm, no murmurs, rubs, or gallops.  Abdomen: Obese. Normoactive bowel sounds. Soft, nondistended, nontender to palpation. No masses or hepatosplenomegaly appreciated. No palpable fluid wave.  Extremities: Grossly normal range of motion. Warm, well perfused. No edema bilaterally.  Skin: No rashes or lesions.  Lymphatics: No cervical, supraclavicular, or inguinal adenopathy.  GU:  Normal external female genitalia. No lesions. No discharge or bleeding.             Vagina: Well rugated, no lesions or masses.  Prolapse noted with cervix just past the introitus.             Cervix: Normal appearing, no lesions.             Uterus: Small, mobile, no parametrial involvement or nodularity.             Adnexa: No masses appreciated.  Rectal: Deferred.  LABORATORY AND RADIOLOGIC DATA:  Outside medical records were reviewed to synthesize the above history, along with the history and physical obtained during the visit.   Lab Results  Component Value Date   WBC 11.2 (H) 03/03/2021   HGB 12.2 03/03/2021   HCT 36.0 03/03/2021   PLT 483 (H) 03/03/2021   GLUCOSE 93 03/03/2021   CHOL 119 03/11/2020   HDL 29 (A) 03/11/2020   LDLCALC 3 03/11/2020   ALT 14 12/04/2020   AST 13 12/04/2020   NA 137 03/03/2021   K 3.5 03/03/2021   CL 99 03/03/2021   CREATININE 0.60 03/03/2021   BUN 8 03/03/2021   CO2 25 (A) 12/04/2020   HGBA1C 5.6 12/04/2020   MICROALBUR 62.8 03/11/2020   Endometrial curettings on 03/03/21: At least EIN with squamous morular metaplasia. Focal areas are concerning for endometrioid adenocarcinoma.  Pelvic ultrasound on 07/28/20: Uterus Measurements: 8.4 x 4.5 x 6 cm = volume: 120 mL. A fibroid is noted at the uterine  fundus measuring 1.8 x 1.1 x 0.9 cm. This fibroid appears to be intramural in location. Endometrium Thickness: 6 mm. There is an endometrial polyp measuring 1.1 x 0.6 x 0.8 cm. Right ovary Measurements: 3.9 x 2.8 x 2.8 cm = volume: 16 mL. Normal appearance/no adnexal mass. Left ovary Measurements: 3.9 x 2.6 x 3.6 cm = volume: 20 mL. Normal appearance/no adnexal mass. Other findings There is a trace volume of pelvic free fluid. IMPRESSION: 1. Possible endometrial polyp versus endometrial hyperplasia. Gynecologic consultation is recommended. 2. Small uterine fibroid as detailed above

## 2021-03-08 NOTE — Progress Notes (Signed)
Patient here for new patient consultation with Dr. Jeral Pinch for EIN and for a pre-operative discussion prior to her scheduled surgery on March 30, 2021. She is scheduled for robotic assisted total laparoscopic hysterectomy, bilateral salpingectomy, sentinel lymph node biopsy, possible lymph node dissection, possible laparotomy. The surgery was discussed in detail.  See after visit summary for additional details. Visual aids used to discuss items related to surgery including sequential compression stockings, foley catheter, IV pump, multi-modal pain regimen including tylenol, photo of the surgical robot, female reproductive system to discuss surgery in detail.      Discussed post-op pain management in detail including the aspects of the enhanced recovery pathway.  Advised her a new prescription for 800 mg ibuprofen would be sent in for post-op pain and she has oxycodone at home if needed for severe pain. We discussed the use of tylenol post-op and to monitor for a maximum of 4,000 mg in a 24 hour period.  Also prescribed sennakot to be used after surgery and to hold if having loose stools.  She takes a daily stool softener and will plan to continue this. Discussed bowel regimen in detail.     Discussed the use of heparin pre-op, SCDs, and measures to take at home to prevent DVT including frequent mobility.  Reportable signs and symptoms of DVT discussed. Post-operative instructions discussed and expectations for after surgery. Incisional care discussed as well including reportable signs and symptoms including erythema, drainage, wound separation.     5 minutes spent with the patient.  Verbalizing understanding of material discussed. No needs or concerns voiced at the end of the visit.   Advised patient to call for any needs.  Advised that her post-operative medications had been prescribed and could be picked up at any time. She would like to proceed with urology consultation for possible surgical  intervention at the time of this surgery for bladder prolapse.   This appointment is included in the global surgical bundle as pre-operative teaching and has no charge.

## 2021-03-08 NOTE — Progress Notes (Signed)
Pre-op gabapentin ordered 

## 2021-03-08 NOTE — Patient Instructions (Addendum)
[facility] is the office in [place] that you can have an inperson or virtual visit.  [facility] is the group in [place]. There number is [phone]. We are happy to send a referral if you need.  Preparing for your Surgery  Plan for surgery with Dr. Jeral Pinch at [place] will be scheduled for robotic assisted total laparoscopic hysterectomy (removal of the uterus and cervix), bilateral salpingectomy (removal of both fallopian tubes), sentinel lymph node biopsy, possible lymph node dissection, possible laparotomy (larger incision if needed on the abdomen).   Given the prolapse noted on exam today, please let us know whether you would like to meet with a urologist for evaluation for possible surgery at the same time as the surgery above.   Please call [phone] when you have decided on a surgery date. If you decide to meet with urology for possible joint surgery, the date may be subject to change.  Pre-operative Testing -You will receive a phone call from presurgical testing at [facility] to arrange for a pre-operative appointment and lab work.  -Bring your insurance card, copy of an advanced directive if applicable, medication list  -At that visit, you will be asked to sign a consent for a possible blood transfusion in case a transfusion becomes necessary during surgery.  The need for a blood transfusion is rare but having consent is a necessary part of your care.     -You should not be taking blood thinners or aspirin at least ten days prior to surgery unless instructed by your surgeon.  -Do not take supplements such as fish oil (omega 3), red yeast rice, turmeric before your surgery. You want to avoid medications with aspirin in them including headache powders such as BC or Goody's), Excedrin migraine.  Day Before Surgery at [place] will be asked to take in a light diet the day before surgery. You will be  advised you can have clear liquids up until 3 hours before your surgery.    Eat a light diet the day before surgery.  Examples including soups, broths, toast, yogurt, mashed potatoes.  AVOID GAS PRODUCING FOODS. Things to avoid include carbonated beverages (fizzy beverages, sodas), raw fruits and raw vegetables (uncooked), or beans.   If your bowels are filled with gas, your surgeon will have difficulty visualizing your pelvic organs which increases your surgical risks.  Your role in recovery Your role is to become active as soon as directed by your doctor, while still giving yourself time to heal.  Rest when you feel tired. You will be asked to do the following in order to speed your recovery:  - Cough and breathe deeply. This helps to clear and expand your lungs and can prevent pneumonia after surgery.  - [place]. Do mild physical activity. Walking or moving your legs help your circulation and body functions return to normal. Do not try to get up or walk alone the first time after surgery.   -If you develop swelling on one leg or the other, pain in the back of your leg, redness/warmth in one of your legs, please call the office or go to the Emergency Room to have a doppler to rule out a blood clot. For shortness of breath, chest pain-seek care in the Emergency Room as soon as possible. - Actively manage your pain. Managing your pain lets you move in comfort. We will ask you to rate your pain on a  scale of zero to 10. It is your responsibility to tell your doctor or nurse where and how much you hurt so your pain can be treated.  Special Considerations -If you are diabetic, you may be placed on insulin after surgery to have closer control over your blood sugars to promote healing and recovery.  This does not mean that you will be discharged on insulin.  If applicable, your oral antidiabetics will be resumed when you are tolerating a solid diet.  -Your final pathology results  from surgery should be available around one week after surgery and the results will be relayed to you when available.  -Dr. Lahoma Crocker is the surgeon that assists your GYN Oncologist with surgery.  If you end up staying the night, the next day after your surgery you will either see Dr. Denman George, Dr. Berline Lopes, or Dr. Lahoma Crocker.  -FMLA forms can be faxed to [phone] and please allow 5-7 business days for completion.  Pain Management After Surgery -You have been prescribed ibuprofen and bowel regimen medications before surgery so that you can have these available when you are discharged from the hospital. You can use the oxycodone at home for severe pain if needed.  -Make sure that you have Tylenol and Ibuprofen at home to use on a regular basis after surgery for pain control. We recommend alternating the medications every hour to six hours since they work differently and are processed in the body differently for pain relief.  -Review the attached handout on narcotic use and their risks and side effects.   Bowel Regimen -You have been prescribed Sennakot-S to take nightly to prevent constipation especially if you are taking the narcotic pain medication intermittently.  It is important to prevent constipation and drink adequate amounts of liquids. You can stop taking this medication when you are not taking pain medication and you are back on your normal bowel routine.  Risks of Surgery Risks of surgery are low but include bleeding, infection, damage to surrounding structures, re-operation, blood clots, and very rarely death.   Blood Transfusion Information (For the consent to be signed before surgery)  We will be checking your blood type before surgery so in case of emergencies, we will know what type of blood you would need.                                            WHAT IS A BLOOD TRANSFUSION?  A transfusion is the replacement of blood or some of its parts. Blood is made up  of multiple cells which provide different functions. Red blood cells carry oxygen and are used for blood loss replacement. White blood cells fight against infection. Platelets control bleeding. Plasma helps clot blood. Other blood products are available for specialized needs, such as hemophilia or other clotting disorders. BEFORE THE TRANSFUSION  Who gives blood for transfusions?  You may be able to donate blood to be used at a later date on yourself (autologous donation). Relatives can be asked to donate blood. This is generally not any safer than if you have received blood from a stranger. The same precautions are taken to ensure safety when a relative's blood is donated. Healthy volunteers who are fully evaluated to make sure their blood is safe. This is blood bank blood. Transfusion therapy is the safest it has ever been in the practice of medicine. Before blood  is taken from a donor, a complete history is taken to make sure that person has no history of diseases nor engages in risky social behavior (examples are intravenous drug use or sexual activity with multiple partners). The donor's travel history is screened to minimize risk of transmitting infections, such as malaria. The donated blood is tested for signs of infectious diseases, such as HIV and hepatitis. The blood is then tested to be sure it is compatible with you in order to minimize the chance of a transfusion reaction. If you or a relative donates blood, this is often done in anticipation of surgery and is not appropriate for emergency situations. It takes many days to process the donated blood. RISKS AND COMPLICATIONS Although transfusion therapy is very safe and saves many lives, the main dangers of transfusion include:  Getting an infectious disease. Developing a transfusion reaction. This is an allergic reaction to something in the blood you were given. Every precaution is taken to prevent this. The decision to have a blood  transfusion has been considered carefully by your caregiver before blood is given. Blood is not given unless the benefits outweigh the risks.  AFTER SURGERY INSTRUCTIONS  Return to work: 4-6 weeks if applicable  Activity: 1. Be up and out of the bed during the day.  Take a nap if needed.  You may walk up steps but be careful and use the hand rail.  Stair climbing will tire you more than you think, you may need to stop part way and rest.   2. No lifting or straining for 6 weeks over 10 pounds. No pushing, pulling, straining for 6 weeks.  3. No driving for 1 week(s).  Do not drive if you are taking narcotic pain medicine and make sure that your reaction time has returned.   4. You can shower as soon as the next day after surgery. Shower daily.  Use your regular soap and water (not directly on the incision) and pat your incision(s) dry afterwards; don't rub.  No tub baths or submerging your body in water until cleared by your surgeon. If you have the soap that was given to you by pre-surgical testing that was used before surgery, you do not need to use it afterwards because this can irritate your incisions.   5. No sexual activity and nothing in the vagina for 8 weeks.  6. You may experience a small amount of clear drainage from your incisions, which is normal.  If the drainage persists, increases, or changes color please call the office.  7. Do not use creams, lotions, or ointments such as neosporin on your incisions after surgery until advised by your surgeon because they can cause removal of the dermabond glue on your incisions.    8. You may experience vaginal spotting after surgery or around the 6-8 week mark from surgery when the stitches at the top of the vagina begin to dissolve.  The spotting is normal but if you experience heavy bleeding, call our office.  9. Take Tylenol or ibuprofen (or naproxen, do not take together since they work similarly) first for pain and only use narcotic pain  medication (oxycodone you have at home) for severe pain not relieved by the Tylenol or Ibuprofen.  Monitor your Tylenol intake to a max of 4,000 mg in a 24 hour period. You can alternate these medications after surgery.  Diet: 1. Low sodium Heart Healthy Diet is recommended but you are cleared to resume your normal (before surgery) diet  after your procedure.  2. It is safe to use a laxative, such as Miralax or Colace, if you have difficulty moving your bowels. You have been prescribed Sennakot at bedtime every evening to keep bowel movements regular and to prevent constipation.    Wound Care: 1. Keep clean and dry.  Shower daily.  Reasons to call the Doctor: Fever - Oral temperature greater than 100.4 degrees Fahrenheit Foul-smelling vaginal discharge Difficulty urinating Nausea and vomiting Increased pain at the site of the incision that is unrelieved with pain medicine. Difficulty breathing with or without chest pain New calf pain especially if only on one side Sudden, continuing increased vaginal bleeding with or without clots.   Contacts: For questions or concerns you should contact:  Dr. Jeral Pinch at [phone]  Joylene John, NP at [phone]  After Hours: call [phone] and have the GYN Oncologist paged/contacted (after 5 pm or on the weekends).  Messages sent via mychart are for non-urgent matters and are not responded to after hours so for urgent needs, please call the after hours number.

## 2021-03-09 ENCOUNTER — Other Ambulatory Visit (HOSPITAL_COMMUNITY): Payer: Self-pay

## 2021-03-09 ENCOUNTER — Encounter (INDEPENDENT_AMBULATORY_CARE_PROVIDER_SITE_OTHER): Payer: Self-pay | Admitting: Family Medicine

## 2021-03-09 ENCOUNTER — Ambulatory Visit (INDEPENDENT_AMBULATORY_CARE_PROVIDER_SITE_OTHER): Payer: No Typology Code available for payment source | Admitting: Family Medicine

## 2021-03-09 VITALS — BP 118/74 | HR 74 | Temp 97.7°F | Ht 64.0 in | Wt 234.0 lb

## 2021-03-09 DIAGNOSIS — E1129 Type 2 diabetes mellitus with other diabetic kidney complication: Secondary | ICD-10-CM | POA: Diagnosis not present

## 2021-03-09 DIAGNOSIS — Z6841 Body Mass Index (BMI) 40.0 and over, adult: Secondary | ICD-10-CM

## 2021-03-09 DIAGNOSIS — F4321 Adjustment disorder with depressed mood: Secondary | ICD-10-CM

## 2021-03-09 DIAGNOSIS — R809 Proteinuria, unspecified: Secondary | ICD-10-CM | POA: Diagnosis not present

## 2021-03-09 MED ORDER — XIGDUO XR 5-1000 MG PO TB24
2.0000 | ORAL_TABLET | Freq: Every morning | ORAL | 1 refills | Status: DC
  Filled 2021-03-09 – 2021-04-02 (×2): qty 180, 90d supply, fill #0

## 2021-03-09 MED ORDER — TIRZEPATIDE 10 MG/0.5ML ~~LOC~~ SOAJ
10.0000 mg | SUBCUTANEOUS | 0 refills | Status: DC
  Filled 2021-03-09: qty 6, 84d supply, fill #0
  Filled 2021-04-02: qty 2, 28d supply, fill #0

## 2021-03-10 NOTE — Progress Notes (Signed)
Chief Complaint:   OBESITY Cathy Ho is here to discuss her progress with her obesity treatment plan along with follow-up of her obesity related diagnoses. Cathy Ho is on the Category 4 Plan and states she is following her eating plan approximately 70% of the time. Cathy Ho states she is doing 0 minutes 0 times per week.  Today's visit was #: 21 Starting weight: 284 lbs Starting date: 10/14/2019 Today's weight: 234 lbs Today's date: 03/09/2021 Total lbs lost to date: 50 lbs Total lbs lost since last in-office visit: 0  Interim History: Cathy Ho is struggling because she found out recently that she needs a hysterectomy and she had hoped to have children. She will have her hysterectomy 03-30-2021.  She has not been able to adhere to Category 4 well due to emotional stress.  Subjective:   1. Type 2 diabetes mellitus with microalbuminuria, without long-term current use of insulin (HCC) Cathy Ho's Type 2 diabetes mellitus is well controlled. She started [facility] at last office visit. She is also on Xigduo. She denies side effects. She feels it is working well for appetite control. She denies hypoglycemia. Not checking CBGs regularly. Last CBG check - 73 fasting.  Lab Results  Component Value Date   HGBA1C 5.6 12/04/2020   HGBA1C 5.9 03/11/2020   Lab Results  Component Value Date   MICROALBUR 62.8 03/11/2020   LDLCALC 3 03/11/2020   CREATININE 0.60 03/03/2021   No results found for: INSULIN   2. Situational depression Cathy Ho has seen counselor with [facility] recently. She feels some what depressed due to recent health issues.  Assessment/Plan:   1. Type 2 diabetes mellitus with microalbuminuria, without long-term current use of insulin (HCC) Refill:  - tirzepatide (MOUNJARO) 10 MG/0.5ML Pen; Inject 10 mg into the skin once a week.  Dispense: 6 mL; Refill: 0 - Dapagliflozin-metFORMIN HCl ER (XIGDUO XR) 08-998 MG TB24; Take 2 tablets by mouth every morning after a meal.  Dispense: 180  tablet; Refill: 1  2. Situational depression Cathy Ho will follow up with counselor PRN.  3. Obesity with current BMI of 40.15 Cathy Ho is currently in the action stage of change. As such, her goal is to continue with weight loss efforts. She has agreed to the Category 4 Plan.   Exercise goals: No exercise has been prescribed at this time.  Behavioral modification strategies: increasing lean protein intake, decreasing simple carbohydrates, and holiday eating strategies .  Cathy Ho has agreed to follow-up with our clinic in 8 weeks due to the fact she will be having a hysterectomy in December.  Objective:   Blood pressure 118/74, pulse 74, temperature 97.7 F (36.5 C), height 5\' 4"  (1.626 m), weight 234 lb (106.1 kg), SpO2 100 %. Body mass index is 40.17 kg/m.  General: Cooperative, alert, well developed, in no acute distress. HEENT: Conjunctivae and lids unremarkable. Cardiovascular: Regular rhythm.  Lungs: Normal work of breathing. Neurologic: No focal deficits.   Lab Results  Component Value Date   CREATININE 0.60 03/03/2021   BUN 8 03/03/2021   NA 137 03/03/2021   K 3.5 03/03/2021   CL 99 03/03/2021   CO2 25 (A) 12/04/2020   Lab Results  Component Value Date   ALT 14 12/04/2020   AST 13 12/04/2020   ALKPHOS 86 12/04/2020   Lab Results  Component Value Date   HGBA1C 5.6 12/04/2020   HGBA1C 5.9 03/11/2020   No results found for: INSULIN No results found for: TSH Lab Results  Component Value Date   CHOL  119 03/11/2020   HDL 29 (A) 03/11/2020   LDLCALC 3 03/11/2020   Lab Results  Component Value Date   VD25OH 62.5 03/11/2020   Lab Results  Component Value Date   WBC 11.2 (H) 03/03/2021   HGB 12.2 03/03/2021   HCT 36.0 03/03/2021   MCV 79.4 (L) 03/03/2021   PLT 483 (H) 03/03/2021   No results found for: IRON, TIBC, FERRITIN  Attestation Statements:   Reviewed by clinician on day of visit: allergies, medications, problem list, medical history, surgical  history, family history, social history, and previous encounter notes.  I, Lizbeth Bark, RMA, am acting as Location manager for [employer], [place].   I have reviewed the above documentation for accuracy and completeness, and I agree with the above. -  Georgianne Fick, FNP

## 2021-03-11 ENCOUNTER — Encounter: Payer: Self-pay | Admitting: Podiatry

## 2021-03-11 ENCOUNTER — Other Ambulatory Visit: Payer: Self-pay

## 2021-03-11 ENCOUNTER — Ambulatory Visit (INDEPENDENT_AMBULATORY_CARE_PROVIDER_SITE_OTHER): Payer: No Typology Code available for payment source | Admitting: Podiatry

## 2021-03-11 ENCOUNTER — Ambulatory Visit (INDEPENDENT_AMBULATORY_CARE_PROVIDER_SITE_OTHER): Payer: No Typology Code available for payment source

## 2021-03-11 DIAGNOSIS — M778 Other enthesopathies, not elsewhere classified: Secondary | ICD-10-CM | POA: Diagnosis not present

## 2021-03-11 DIAGNOSIS — N898 Other specified noninflammatory disorders of vagina: Secondary | ICD-10-CM | POA: Insufficient documentation

## 2021-03-11 DIAGNOSIS — M7752 Other enthesopathy of left foot: Secondary | ICD-10-CM

## 2021-03-11 DIAGNOSIS — M19072 Primary osteoarthritis, left ankle and foot: Secondary | ICD-10-CM

## 2021-03-11 MED ORDER — TRIAMCINOLONE ACETONIDE 40 MG/ML IJ SUSP
20.0000 mg | Freq: Once | INTRAMUSCULAR | Status: AC
  Administered 2021-03-11: 16:00:00 20 mg

## 2021-03-12 ENCOUNTER — Ambulatory Visit
Admission: RE | Admit: 2021-03-12 | Discharge: 2021-03-12 | Disposition: A | Discharge status: Home / Self Care | Payer: No Typology Code available for payment source | Source: Ambulatory Visit | Attending: Family Medicine | Admitting: Family Medicine

## 2021-03-12 DIAGNOSIS — Z1231 Encounter for screening mammogram for malignant neoplasm of breast: Secondary | ICD-10-CM

## 2021-03-14 NOTE — Progress Notes (Signed)
Subjective:  Patient ID: Cathy Ho, female    DOB: March 25, 1981,  MRN: [id number] HPI Chief Complaint  Patient presents with   Foot Pain    Dorsal forefoot/anterior ankle left - twisted foot end of Sept, aching and swelling since, tried elevating   Diabetes    Last a1c was 5.6   New Patient (Initial Visit)    40 y.o. female presents with the above complaint.   ROS: Denies fever chills nausea vomiting muscle aches pains calf pain back pain chest pain shortness of breath.  Past Medical History:  Diagnosis Date   Anemia    Back pain    Chest pain    COVID 08/2020   Runny nose, fever, HA symptoms resolved   Diabetes mellitus without complication ([place]) 2505   diet and exercise only   Fibroids 2010   Hidradenitis suppurativa    Hypertension    Joint pain    Low iron    PCOS (polycystic ovarian syndrome)    Prolapsed uterus 2010   Sleep apnea    cpap, pt does not know settings   Vitamin D deficiency    Wears glasses    Past Surgical History:  Procedure Laterality Date   carbuckle cyst in back removed  02/2005   COLONOSCOPY N/A 08/15/2013   Procedure: COLONOSCOPY;  Surgeon: Inda Castle, MD;  Location: [facility];  Service: Endoscopy;  Laterality: N/A;   DILATION AND CURETTAGE OF UTERUS  2011   Endometrial hyperplasia with atypia, submucosal fibroid   HYSTEROSCOPY  2011   HYSTEROSCOPY WITH D & C N/A 03/03/2021   Procedure: DILATATION AND CURETTAGE /HYSTEROSCOPY;  Surgeon: Osborne Oman, MD;  Location: [facility];  Service: Gynecology;  Laterality: N/A;   WISDOM TOOTH EXTRACTION  1998    Current Outpatient Medications:    alclomethasone (ACLOVATE) 0.05 % cream, Apply 1 application topically 1 (one) to 2 (two) times daily as needed for rash., Disp: 180 g, Rfl: 3   Cholecalciferol (SM VITAMIN D3) 100 MCG (4000 UT) CAPS, Take 1 capsule (4,000 Units total) by mouth daily., Disp: 30 capsule, Rfl: 0   clindamycin (CLEOCIN T) 1 % lotion, APPLY  TOPICALLY TO AFFECTED AREA TWICE DAILY., Disp: 60 mL, Rfl: 2   Dapagliflozin-metFORMIN HCl ER (XIGDUO XR) 08-998 MG TB24, Take 2 tablets by mouth every morning after a meal., Disp: 180 tablet, Rfl: 1   docusate sodium (COLACE) 100 MG capsule, Take 1 capsule (100 mg total) by mouth 2 (two) times daily as needed for mild constipation or moderate constipation., Disp: 30 capsule, Rfl: 2   ferrous sulfate 325 (65 FE) MG tablet, Take 325 mg by mouth daily with breakfast. prn, Disp: , Rfl:    ibuprofen (ADVIL) 800 MG tablet, Take 1 tablet (800 mg total) by mouth every 8 (eight) hours as needed for moderate pain. For AFTER surgery only, Disp: 30 tablet, Rfl: 0   lisinopril-hydrochlorothiazide (ZESTORETIC) 10-12.5 MG tablet, Take 1 tablet by mouth daily., Disp: 90 tablet, Rfl: 3   mupirocin ointment (BACTROBAN) 2 %, Apply 1 application topically 2 (two) times daily., Disp: 22 g, Rfl: 6   naproxen sodium (ANAPROX) 550 MG tablet, Take 1 tablet (550 mg total) by mouth 2 (two) times daily with a meal as needed for pain and as instructed around periods, Disp: 30 tablet, Rfl: 2   oxyCODONE (OXY IR/ROXICODONE) 5 MG immediate release tablet, Take 1 tablet (5 mg total) by mouth every 4 (four) hours as needed for severe pain  or breakthrough pain., Disp: 30 tablet, Rfl: 0   senna-docusate (SENOKOT-S) 8.6-50 MG tablet, Take 2 tablets by mouth at bedtime. For AFTER surgery, do not take if having diarrhea, Disp: 30 tablet, Rfl: 0   tirzepatide (MOUNJARO) 10 MG/0.5ML Pen, Inject 10 mg into the skin once a week., Disp: 6 mL, Rfl: 0   tranexamic acid (LYSTEDA) 650 MG TABS tablet, TAKE 2 TABLETS (1,300 MG TOTAL) BY MOUTH 3 (THREE) TIMES DAILY. TAKE DURING MENSES FOR A MAXIMUM OF FIVE DAYS, Disp: 30 tablet, Rfl: 2  No Known Allergies Review of Systems Objective:  There were no vitals filed for this visit.  General: Well developed, nourished, in no acute distress, alert and oriented x3   Dermatological: Skin is warm, dry and  supple bilateral. Nails x 10 are well maintained; remaining integument appears unremarkable at this time. There are no open sores, no preulcerative lesions, no rash or signs of infection present.  Vascular: Dorsalis Pedis artery and Posterior Tibial artery pedal pulses are 2/4 bilateral with immedate capillary fill time. Pedal hair growth present. No varicosities and no lower extremity edema present bilateral.   Neruologic: Grossly intact via light touch bilateral. Vibratory intact via tuning fork bilateral. Protective threshold with Semmes Wienstein monofilament intact to all pedal sites bilateral. Patellar and Achilles deep tendon reflexes 2+ bilateral. No Babinski or clonus noted bilateral.   Musculoskeletal: No gross boney pedal deformities bilateral. No pain, crepitus, or limitation noted with foot and ankle range of motion bilateral. Muscular strength 5/5 in all groups tested bilateral.  She has pain on palpation left foot just anterior to the ankle where there is some dorsal spurring on the foot.  Gait: Unassisted, Nonantalgic.    Radiographs:  Significant spurring is noted on this osseously mature individual of the tarsometatarsal joints with pes planovalgus.  No acute findings are identified.  Some soft tissue swelling dorsally is noted.  Assessment & Plan:   Assessment: Osteoarthritis with pes planovalgus.  Capsulitis dorsal aspect foot.    Plan: Injected the dorsal aspect of the foot today 20 mg Kenalog 5 mg Marcaine point of maximal tenderness.  Discussed the patient in detail that she may get some discoloration with the medication and possible thinning of the skin as well as a possible rupture or tear of the extensor tendons.  She understands this is amendable to it I willing to accept the risks involved.  Follow-up with me on an as-needed basis if this fails to alleviate her symptoms we did also discuss lacing options for her shoes.     Cathy Ho T. [place], [place]

## 2021-03-16 ENCOUNTER — Other Ambulatory Visit: Payer: Self-pay | Admitting: Family Medicine

## 2021-03-16 DIAGNOSIS — R928 Other abnormal and inconclusive findings on diagnostic imaging of breast: Secondary | ICD-10-CM

## 2021-03-17 ENCOUNTER — Other Ambulatory Visit (HOSPITAL_COMMUNITY): Payer: Self-pay

## 2021-03-17 ENCOUNTER — Telehealth: Payer: Self-pay | Admitting: Gynecologic Oncology

## 2021-03-17 ENCOUNTER — Other Ambulatory Visit: Payer: Self-pay | Admitting: Urology

## 2021-03-17 ENCOUNTER — Encounter: Payer: Self-pay | Admitting: Gynecologic Oncology

## 2021-03-17 NOTE — Telephone Encounter (Signed)
Called and left a message with Pam with [facility] about scheduling a joint surgery case with Dr. Berline Lopes and Dr. Louis Meckel.

## 2021-03-22 ENCOUNTER — Telehealth: Payer: Self-pay | Admitting: *Deleted

## 2021-03-22 NOTE — Telephone Encounter (Signed)
Fax updated paperwork to [facility]

## 2021-03-23 NOTE — Patient Instructions (Addendum)
DUE TO COVID-19 ONLY ONE VISITOR IS ALLOWED TO COME WITH YOU AND STAY IN THE WAITING ROOM ONLY DURING PRE OP AND PROCEDURE DAY OF SURGERY.   Up to two visitors ages 16+ are allowed at one time in a patient's room.  The visitors may rotate out with other people throughout the day.  Additionally, up to two children between the ages of 30 and 57 are allowed and do not count toward the number of allowed visitors.  Children within this age range must be accompanied by an adult visitor.  One adult visitor may remain with the patient overnight and must be in the room by 8 PM.  YOU NEED TO HAVE A COVID 19 TEST On__n/a_____ between 8am-3pm______, THIS TEST MUST BE DONE BEFORE SURGERY,     Please bring completed form with you to the COVID testing site   COVID TESTING SITE [place] TEST IS COMPLETED,  PLEASE Wear a mask when in public           Your procedure is scheduled on: 04-08-21   Report to [facility] Main  Entrance   Report to admitting at        [place]  AM     Call this number if you have problems the morning of surgery [phone]   Remember: Follow bowel prep and diet per MD orders you may have clear liquids until 0430 am then nothing by mouth    CLEAR LIQUID DIET                                                                    water Black Coffee and tea, regular and decaf No Creamer                            Plain Jell-O any favor except red or purple                                  Fruit ices (not with fruit pulp)                                      Iced Popsicles                                                                   Cranberry, grape and apple juices Sports drinks like Gatorade Lightly seasoned clear broth or consume(fat free) Sugar, honey syrup  Sample Menu Breakfast                                Lunch  Supper Cranberry juice                    Beef  broth                            Chicken broth Jell-O                                     Grape juice                           Apple juice Coffee or tea                        Jell-O                                      Popsicle                                                Coffee or tea                        Coffee or tea  _____________________________________________________________________     BRUSH YOUR TEETH MORNING OF SURGERY AND RINSE YOUR MOUTH OUT, NO CHEWING GUM CANDY OR MINTS.     Take these medicines the morning of surgery with A SIP OF WATER: NONE  Hold  Xigduo XR day before surgery  DO NOT TAKE ANY DIABETIC MEDICATIONS DAY OF YOUR SURGERY                               You may not have any metal on your body including hair pins and              piercings  Do not wear jewelry, make-up, lotions, powders,perfumes,        deodorant             Do not wear nail polish on your fingernails or toenails .  Do not shave  48 hours prior to surgery.            Do not bring valuables to the hospital. [place].  Contacts, dentures or bridgework may not be worn into surgery.       Patients discharged the day of surgery will not be allowed to drive home. IF YOU ARE HAVING SURGERY AND GOING HOME THE SAME DAY, YOU MUST HAVE AN ADULT TO DRIVE YOU HOME AND BE WITH YOU FOR 24 HOURS. YOU MAY GO HOME BY TAXI OR UBER OR ORTHERWISE, BUT AN ADULT MUST ACCOMPANY YOU HOME AND STAY WITH YOU FOR 24 HOURS.  Name and phone number of your driver:  Special Instructions: N/A              Please read over the following fact sheets you were given: _____________________________________________________________________             [facility] - Preparing for  Surgery Before surgery, you can play an important role.  Because skin is not sterile, your skin needs to be as free of germs as possible.  You can reduce the number of germs on your skin by washing  with CHG (chlorahexidine gluconate) soap before surgery.  CHG is an antiseptic cleaner which kills germs and bonds with the skin to continue killing germs even after washing. Please DO NOT use if you have an allergy to CHG or antibacterial soaps.  If your skin becomes reddened/irritated stop using the CHG and inform your nurse when you arrive at Short Stay. Do not shave (including legs and underarms) for at least 48 hours prior to the first CHG shower.  You may shave your face/neck. Please follow these instructions carefully:  1.  Shower with CHG Soap the night before surgery and the  morning of Surgery.  2.  If you choose to wash your hair, wash your hair first as usual with your  normal  shampoo.  3.  After you shampoo, rinse your hair and body thoroughly to remove the  shampoo.                           4.  Use CHG as you would any other liquid soap.  You can apply chg directly  to the skin and wash                       Gently with a scrungie or clean washcloth.  5.  Apply the CHG Soap to your body ONLY FROM THE NECK DOWN.   Do not use on face/ open                           Wound or open sores. Avoid contact with eyes, ears mouth and genitals (private parts).                       Wash face,  Genitals (private parts) with your normal soap.             6.  Wash thoroughly, paying special attention to the area where your surgery  will be performed.  7.  Thoroughly rinse your body with warm water from the neck down.  8.  DO NOT shower/wash with your normal soap after using and rinsing off  the CHG Soap.                9.  Pat yourself dry with a clean towel.            10.  Wear clean pajamas.            11.  Place clean sheets on your bed the night of your first shower and do not  sleep with pets. Day of Surgery : Do not apply any lotions/deodorants the morning of surgery.  Please wear clean clothes to the hospital/surgery center.  FAILURE TO FOLLOW THESE INSTRUCTIONS MAY RESULT IN THE  CANCELLATION OF YOUR SURGERY PATIENT SIGNATURE_________________________________  NURSE SIGNATURE__________________________________  ________________________________________________________________________

## 2021-03-25 ENCOUNTER — Encounter (HOSPITAL_COMMUNITY)
Admission: RE | Admit: 2021-03-25 | Discharge: 2021-03-25 | Disposition: A | Discharge status: Home / Self Care | Payer: No Typology Code available for payment source | Source: Ambulatory Visit | Attending: Gynecologic Oncology | Admitting: Gynecologic Oncology

## 2021-03-25 ENCOUNTER — Encounter (HOSPITAL_COMMUNITY): Payer: Self-pay

## 2021-03-25 ENCOUNTER — Encounter: Payer: Self-pay | Admitting: Obstetrics & Gynecology

## 2021-03-25 ENCOUNTER — Telehealth (INDEPENDENT_AMBULATORY_CARE_PROVIDER_SITE_OTHER): Payer: No Typology Code available for payment source | Admitting: Obstetrics & Gynecology

## 2021-03-25 ENCOUNTER — Other Ambulatory Visit: Payer: Self-pay

## 2021-03-25 ENCOUNTER — Telehealth: Payer: Self-pay

## 2021-03-25 VITALS — BP 155/92 | HR 88 | Temp 98.5°F | Resp 16 | Ht 64.0 in | Wt 234.0 lb

## 2021-03-25 DIAGNOSIS — N8502 Endometrial intraepithelial neoplasia [EIN]: Secondary | ICD-10-CM | POA: Diagnosis not present

## 2021-03-25 DIAGNOSIS — E1165 Type 2 diabetes mellitus with hyperglycemia: Secondary | ICD-10-CM | POA: Diagnosis not present

## 2021-03-25 DIAGNOSIS — Z01812 Encounter for preprocedural laboratory examination: Secondary | ICD-10-CM | POA: Insufficient documentation

## 2021-03-25 DIAGNOSIS — Z01818 Encounter for other preprocedural examination: Secondary | ICD-10-CM

## 2021-03-25 DIAGNOSIS — E1129 Type 2 diabetes mellitus with other diabetic kidney complication: Secondary | ICD-10-CM

## 2021-03-25 HISTORY — DX: Unspecified osteoarthritis, unspecified site: M19.90

## 2021-03-25 LAB — COMPREHENSIVE METABOLIC PANEL
ALT: 20 U/L (ref 0–44)
AST: 17 U/L (ref 15–41)
Albumin: 3.8 g/dL (ref 3.5–5.0)
Alkaline Phosphatase: 99 U/L (ref 38–126)
Anion gap: 7 (ref 5–15)
BUN: 21 mg/dL — ABNORMAL HIGH (ref 6–20)
CO2: 23 mmol/L (ref 22–32)
Calcium: 9.4 mg/dL (ref 8.9–10.3)
Chloride: 102 mmol/L (ref 98–111)
Creatinine, Ser: 0.66 mg/dL (ref 0.44–1.00)
GFR, Estimated: >60 mL/min (ref >60)
Glucose, Bld: 95 mg/dL (ref 70–99)
Potassium: 4.2 mmol/L (ref 3.5–5.1)
Sodium: 132 mmol/L — ABNORMAL LOW (ref 135–145)
Total Bilirubin: 0.7 mg/dL (ref 0.3–1.2)
Total Protein: 8.3 g/dL — ABNORMAL HIGH (ref 6.5–8.1)

## 2021-03-25 LAB — CBC
HCT: 36.5 % (ref 36.0–46.0)
Hemoglobin: 11.1 g/dL — ABNORMAL LOW (ref 12.0–15.0)
MCH: 24.2 pg — ABNORMAL LOW (ref 26.0–34.0)
MCHC: 30.4 g/dL (ref 30.0–36.0)
MCV: 79.7 fL — ABNORMAL LOW (ref 80.0–100.0)
Platelets: 701 10*3/uL — ABNORMAL HIGH (ref 150–400)
RBC: 4.58 MIL/uL (ref 3.87–5.11)
RDW: 16.5 % — ABNORMAL HIGH (ref 11.5–15.5)
WBC: 11.1 10*3/uL — ABNORMAL HIGH (ref 4.0–10.5)
nRBC: 0 % (ref 0.0–0.2)

## 2021-03-25 LAB — URINALYSIS, ROUTINE W REFLEX MICROSCOPIC
Bilirubin Urine: NEGATIVE
Glucose, UA: 150 mg/dL — AB
Ketones, ur: NEGATIVE mg/dL
Leukocytes,Ua: NEGATIVE
Nitrite: NEGATIVE
Protein, ur: NEGATIVE mg/dL
Specific Gravity, Urine: 1.023 (ref 1.005–1.030)
pH: 5 (ref 5.0–8.0)

## 2021-03-25 LAB — HEMOGLOBIN A1C
Hgb A1c MFr Bld: 5.8 % — ABNORMAL HIGH (ref 4.8–5.6)
Mean Plasma Glucose: 119.76 mg/dL

## 2021-03-25 LAB — GLUCOSE, CAPILLARY: Glucose-Capillary: 108 mg/dL — ABNORMAL HIGH (ref 70–99)

## 2021-03-25 NOTE — Telephone Encounter (Signed)
Spoke with Cathy Ho and reviewed CMP, CBC and UA results from today. Per Joylene John, NP sodium is slightly low at 132. Patient states she has been trying to reduce her sodium intake. Advised patient not to restrict sodium but to consume in moderation. WBC slightly elevated at 11.1, patient denies any signs of infection( fever, chills, or cough). She states she has hidradenitis suppurativa and has had some axillary boils that are healing. She was placed on antibiotics for this and still has some drainage from them but reports they are healing.  Reviewed UA results, patient denies urinary urgency, frequency, burning, pain or discomfort. Urine sent for culture, instructed patient that our office will call with the results once we receive them. Patient verbalized understanding of above information. Instructed to call with any questions or concerns.

## 2021-03-25 NOTE — Progress Notes (Addendum)
PCP - Jarvis Morgan Cardiologist - no  PPM/ICD -  Device Orders -  Rep Notified -   Chest x-ray -  EKG -  Stress Test -  ECHO -  Cardiac Cath -   Sleep Study -  CPAP - yes  Fasting Blood Sugar - 73-80 Checks Blood Sugar __1 every 2 days ___ times a day  Blood Thinner Instructions: Aspirin Instructions:  ERAS Protcol - PRE-SURGERY   COVID TEST- N/a COVID vaccine -yes x2 pfizer  Activity--Able to walk a flight of stairs without SOB Anesthesia review: OSA uses Cpap , Dm HTN  Patient denies shortness of breath, fever, cough and chest pain at PAT appointment   All instructions explained to the patient, with a verbal understanding of the material. Patient agrees to go over the instructions while at home for a better understanding. Patient also instructed to self quarantine after being tested for COVID-19. The opportunity to ask questions was provided.

## 2021-03-25 NOTE — Progress Notes (Signed)
GYNECOLOGY VIRTUAL VISIT ENCOUNTER NOTE  Provider location: [facility] [place] [facility] [place]   Patient location: Home  I connected with Cathy Ho on 03/25/21 at  2:35 PM EST by MyChart Video Encounter and verified that I am speaking with the correct person using two identifiers.   I discussed the limitations, risks, security and privacy concerns of performing an evaluation and management service virtually and the availability of in person appointments. I also discussed with the patient that there may be a patient responsible charge related to this service. The patient expressed understanding and agreed to proceed.   History:  Cathy Ho is a 40 y.o. G0P0 female being followed up today after her Hysteroscopy, D&C on 03/03/2021.  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  This was discussed with her, and referral was made to Gynecologic Oncology.  She has been scheduled for [facility] by Dr. Berline Lopes on 04/08/21, had her preop appointment today.  She denies any abnormal vaginal discharge, bleeding, pelvic pain or other concerns.       Past Medical History:  Diagnosis Date   Anemia    Arthritis    Back pain    Chest pain    COVID 08/2020   Runny nose, fever, HA symptoms resolved   Diabetes mellitus without complication ([place]) 4268   type 2   Fibroids 2010   Hidradenitis suppurativa    Hypertension    Joint pain    Low iron    PCOS (polycystic ovarian syndrome)    Prolapsed uterus 2010   Sleep apnea    cpap, pt does not know settings   Vitamin D deficiency    Wears glasses    Past Surgical History:  Procedure Laterality Date   carbuckle cyst in back removed  02/2005   COLONOSCOPY N/A 08/15/2013   Procedure: COLONOSCOPY;  Surgeon: Inda Castle, MD;  Location: [facility];  Service: Endoscopy;  Laterality: N/A;   DILATION AND  CURETTAGE OF UTERUS  2011   Endometrial hyperplasia with atypia, submucosal fibroid   HYSTEROSCOPY  2011   HYSTEROSCOPY WITH D & C N/A 03/03/2021   Procedure: DILATATION AND CURETTAGE /HYSTEROSCOPY;  Surgeon: Osborne Oman, MD;  Location: [facility];  Service: Gynecology;  Laterality: N/A;   [place] EXTRACTION  1998   The following portions of the patient's history were reviewed and updated as appropriate: allergies, current medications, past family history, past medical history, past social history, past surgical history and problem list.   Health Maintenance:  Normal pap and negative HRHPV on 07/08/2020.  Normal mammogram on 03/12/2021.   Review of Systems:  Pertinent items noted in HPI and remainder of comprehensive ROS otherwise negative.  Physical Exam:   General:  Alert, oriented and cooperative. Patient appears to be in no acute distress.  Mental Status: Normal mood and affect. Normal behavior. Normal judgment and thought content.   Respiratory: Normal respiratory effort, no problems with respiration noted  Rest of physical exam deferred due to type of encounter      Assessment and Plan:     1. EIN (endometrial intraepithelial neoplasia) Follow up with planned surgery with Dr. Berline Lopes. All questions answered about her surgery and postoperative GYN expectations. She was worried about possibly needing HRT, she was told oophorectomy was not planned for now, but if this is done, will treat accordingly. She will follow up with [place]  for routine GYN concerns after her surgery  once cleared by Dr. Berline Lopes.     I discussed the assessment and treatment plan with the patient. The patient was provided an opportunity to ask questions and all were answered. The patient agreed with the plan and demonstrated an understanding of the instructions.   The patient was advised to call back or seek an in-person evaluation/go to the ED if the symptoms worsen or if the condition  fails to improve as anticipated.  I provided 15 minutes of face-to-face time during this encounter.   Verita Schneiders, MD [facility] for [facility], [place]

## 2021-03-26 ENCOUNTER — Encounter: Payer: Self-pay | Admitting: Gynecologic Oncology

## 2021-03-26 LAB — URINE CULTURE
Culture: <10000 — AB
Special Requests: NORMAL

## 2021-03-26 NOTE — Progress Notes (Signed)
Called and spoke with [facility] at [facility] to followup on urine culture done 03/25/2021.  Urine culture still In process as of 1530pm on 03/26/2021.  Results will not be back until tonite or tomorrow per Micro on 03/26/2021.

## 2021-03-29 NOTE — Progress Notes (Signed)
URine culture of 03/25/2021 routed to DR Louis Meckel and Jeral Pinch on 03/29/2021.

## 2021-03-30 DIAGNOSIS — N8502 Endometrial intraepithelial neoplasia [EIN]: Secondary | ICD-10-CM

## 2021-04-02 ENCOUNTER — Other Ambulatory Visit (HOSPITAL_COMMUNITY): Payer: Self-pay

## 2021-04-05 ENCOUNTER — Telehealth: Payer: Self-pay | Admitting: *Deleted

## 2021-04-05 NOTE — Telephone Encounter (Signed)
Fax short term disability to [facility]

## 2021-04-06 ENCOUNTER — Inpatient Hospital Stay: Payer: No Typology Code available for payment source | Admitting: Gynecologic Oncology

## 2021-04-07 ENCOUNTER — Telehealth: Payer: Self-pay

## 2021-04-07 NOTE — Telephone Encounter (Signed)
Telephone call to check on pre-operative status.  Patient compliant with pre-operative instructions.  Reinforced nothing to eat after midnight, clear liquids until 0430.  No questions or concerns voiced.  Instructed to call for any needs.

## 2021-04-07 NOTE — Anesthesia Preprocedure Evaluation (Addendum)
Anesthesia Evaluation  Patient identified by MRN, date of birth, ID band Patient awake    Reviewed: Allergy & Precautions, NPO status , Patient's Chart, lab work & pertinent test results  History of Anesthesia Complications Negative for: history of anesthetic complications  Airway Mallampati: III  TM Distance: >3 FB Neck ROM: Full    Dental no notable dental hx. (+) Dental Advisory Given   Pulmonary sleep apnea and Continuous Positive Airway Pressure Ventilation ,    Pulmonary exam normal        Cardiovascular hypertension, Pt. on medications Normal cardiovascular exam     Neuro/Psych PSYCHIATRIC DISORDERS Depression    GI/Hepatic negative GI ROS, Neg liver ROS,   Endo/Other  diabetesMorbid obesity  Renal/GU negative Renal ROS     Musculoskeletal negative musculoskeletal ROS (+)   Abdominal   Peds  Hematology   Anesthesia Other Findings   Reproductive/Obstetrics                            Anesthesia Physical  Anesthesia Plan  ASA: 3  Anesthesia Plan: General   Post-op Pain Management: Celebrex PO (pre-op) and Tylenol PO (pre-op)   Induction: Intravenous  PONV Risk Score and Plan: 4 or greater and Ondansetron, Dexamethasone, Midazolam and Scopolamine patch - Pre-op  Airway Management Planned: Oral ETT  Additional Equipment: None  Intra-op Plan:   Post-operative Plan: Extubation in OR  Informed Consent: I have reviewed the patients History and Physical, chart, labs and discussed the procedure including the risks, benefits and alternatives for the proposed anesthesia with the patient or authorized representative who has indicated his/her understanding and acceptance.     Dental advisory given  Plan Discussed with: Anesthesiologist and CRNA  Anesthesia Plan Comments:        Anesthesia Quick Evaluation

## 2021-04-08 ENCOUNTER — Other Ambulatory Visit: Payer: Self-pay

## 2021-04-08 ENCOUNTER — Observation Stay (HOSPITAL_COMMUNITY)
Admission: RE | Admit: 2021-04-08 | Discharge: 2021-04-09 | Disposition: A | Discharge status: Home / Self Care | Payer: No Typology Code available for payment source | Source: Ambulatory Visit | Attending: Urology | Admitting: Urology

## 2021-04-08 ENCOUNTER — Encounter (HOSPITAL_COMMUNITY)
Admission: RE | Disposition: A | Discharge status: Home / Self Care | Payer: Self-pay | Source: Ambulatory Visit | Attending: Urology

## 2021-04-08 ENCOUNTER — Encounter (HOSPITAL_COMMUNITY): Payer: Self-pay | Admitting: Gynecologic Oncology

## 2021-04-08 ENCOUNTER — Ambulatory Visit (HOSPITAL_COMMUNITY): Payer: No Typology Code available for payment source | Admitting: Anesthesiology

## 2021-04-08 DIAGNOSIS — I10 Essential (primary) hypertension: Secondary | ICD-10-CM | POA: Insufficient documentation

## 2021-04-08 DIAGNOSIS — E119 Type 2 diabetes mellitus without complications: Secondary | ICD-10-CM | POA: Diagnosis not present

## 2021-04-08 DIAGNOSIS — Z79899 Other long term (current) drug therapy: Secondary | ICD-10-CM | POA: Insufficient documentation

## 2021-04-08 DIAGNOSIS — N8111 Cystocele, midline: Secondary | ICD-10-CM

## 2021-04-08 DIAGNOSIS — Z8616 Personal history of COVID-19: Secondary | ICD-10-CM | POA: Diagnosis not present

## 2021-04-08 DIAGNOSIS — Z01818 Encounter for other preprocedural examination: Secondary | ICD-10-CM

## 2021-04-08 DIAGNOSIS — N819 Female genital prolapse, unspecified: Principal | ICD-10-CM | POA: Diagnosis present

## 2021-04-08 DIAGNOSIS — N8502 Endometrial intraepithelial neoplasia [EIN]: Secondary | ICD-10-CM | POA: Diagnosis not present

## 2021-04-08 HISTORY — PX: ROBOTIC ASSISTED LAPAROSCOPIC HYSTERECTOMY AND SALPINGECTOMY: SHX6379

## 2021-04-08 HISTORY — PX: ROBOTIC ASSISTED LAPAROSCOPIC SACROCOLPOPEXY: SHX5388

## 2021-04-08 HISTORY — PX: SENTINEL NODE BIOPSY: SHX6608

## 2021-04-08 LAB — CBC
HCT: 31.5 % — ABNORMAL LOW (ref 36.0–46.0)
Hemoglobin: 10.1 g/dL — ABNORMAL LOW (ref 12.0–15.0)
MCH: 24.9 pg — ABNORMAL LOW (ref 26.0–34.0)
MCHC: 32.1 g/dL (ref 30.0–36.0)
MCV: 77.6 fL — ABNORMAL LOW (ref 80.0–100.0)
Platelets: 464 10*3/uL — ABNORMAL HIGH (ref 150–400)
RBC: 4.06 MIL/uL (ref 3.87–5.11)
RDW: 16.6 % — ABNORMAL HIGH (ref 11.5–15.5)
WBC: 17.3 10*3/uL — ABNORMAL HIGH (ref 4.0–10.5)
nRBC: 0 % (ref 0.0–0.2)

## 2021-04-08 LAB — BASIC METABOLIC PANEL
Anion gap: 11 (ref 5–15)
BUN: 12 mg/dL (ref 6–20)
CO2: 18 mmol/L — ABNORMAL LOW (ref 22–32)
Calcium: 9 mg/dL (ref 8.9–10.3)
Chloride: 106 mmol/L (ref 98–111)
Creatinine, Ser: 0.73 mg/dL (ref 0.44–1.00)
GFR, Estimated: >60 mL/min (ref >60)
Glucose, Bld: 204 mg/dL — ABNORMAL HIGH (ref 70–99)
Potassium: 3.7 mmol/L (ref 3.5–5.1)
Sodium: 135 mmol/L (ref 135–145)

## 2021-04-08 LAB — TYPE AND SCREEN
ABO/RH(D): O POS
Antibody Screen: NEGATIVE

## 2021-04-08 LAB — GLUCOSE, CAPILLARY
Glucose-Capillary: 114 mg/dL — ABNORMAL HIGH (ref 70–99)
Glucose-Capillary: 149 mg/dL — ABNORMAL HIGH (ref 70–99)
Glucose-Capillary: 112 mg/dL — ABNORMAL HIGH (ref 70–99)

## 2021-04-08 LAB — PREGNANCY, URINE: Preg Test, Ur: NEGATIVE

## 2021-04-08 LAB — ABO/RH: ABO/RH(D): O POS

## 2021-04-08 SURGERY — XI ROBOTIC ASSISTED LAPAROSCOPIC HYSTERECTOMY AND SALPINGECTOMY
Anesthesia: General

## 2021-04-08 MED ORDER — CHLORHEXIDINE GLUCONATE 0.12 % MT SOLN
15.0000 mL | Freq: Once | OROMUCOSAL | Status: AC
  Administered 2021-04-08: 15 mL via OROMUCOSAL

## 2021-04-08 MED ORDER — KCL IN DEXTROSE-NACL 20-5-0.45 MEQ/L-%-% IV SOLN
INTRAVENOUS | Status: DC
  Filled 2021-04-08 (×2): qty 1000

## 2021-04-08 MED ORDER — LACTATED RINGERS IR SOLN
Status: DC | PRN
  Administered 2021-04-08: 1000 mL

## 2021-04-08 MED ORDER — AMISULPRIDE (ANTIEMETIC) 5 MG/2ML IV SOLN: 10.0000 mg | Freq: Once | INTRAVENOUS | Status: DC | PRN

## 2021-04-08 MED ORDER — ESTRADIOL 0.1 MG/GM VA CREA
TOPICAL_CREAM | VAGINAL | Status: DC | PRN
  Administered 2021-04-08: 1 via VAGINAL

## 2021-04-08 MED ORDER — ESTRADIOL 0.1 MG/GM VA CREA
TOPICAL_CREAM | VAGINAL | Status: AC
  Filled 2021-04-08: qty 42.5

## 2021-04-08 MED ORDER — SENNOSIDES-DOCUSATE SODIUM 8.6-50 MG PO TABS
2.0000 | ORAL_TABLET | Freq: Every day | ORAL | Status: DC
  Administered 2021-04-08: 2 via ORAL
  Filled 2021-04-08: qty 2

## 2021-04-08 MED ORDER — ACETAMINOPHEN 10 MG/ML IV SOLN
1000.0000 mg | Freq: Four times a day (QID) | INTRAVENOUS | Status: DC
  Administered 2021-04-08 – 2021-04-09 (×3): 1000 mg via INTRAVENOUS
  Filled 2021-04-08 (×3): qty 100

## 2021-04-08 MED ORDER — FENTANYL CITRATE PF 50 MCG/ML IJ SOSY: 25.0000 ug | PREFILLED_SYRINGE | INTRAMUSCULAR | Status: DC | PRN

## 2021-04-08 MED ORDER — SCOPOLAMINE 1 MG/3DAYS TD PT72
1.0000 | MEDICATED_PATCH | TRANSDERMAL | Status: DC
  Administered 2021-04-08: 1.5 mg via TRANSDERMAL
  Filled 2021-04-08: qty 1

## 2021-04-08 MED ORDER — STERILE WATER FOR INJECTION IJ SOLN
INTRAMUSCULAR | Status: AC
  Filled 2021-04-08: qty 10

## 2021-04-08 MED ORDER — DEXAMETHASONE SODIUM PHOSPHATE 4 MG/ML IJ SOLN: 4.0000 mg | INTRAMUSCULAR | Status: DC

## 2021-04-08 MED ORDER — FENTANYL CITRATE (PF) 100 MCG/2ML IJ SOLN
INTRAMUSCULAR | Status: DC | PRN
  Administered 2021-04-08 (×3): 50 ug via INTRAVENOUS
  Administered 2021-04-08: 100 ug via INTRAVENOUS
  Administered 2021-04-08: 50 ug via INTRAVENOUS

## 2021-04-08 MED ORDER — BUPIVACAINE HCL 0.25 % IJ SOLN
INTRAMUSCULAR | Status: AC
  Filled 2021-04-08: qty 1

## 2021-04-08 MED ORDER — LACTATED RINGERS IV SOLN: INTRAVENOUS | Status: DC

## 2021-04-08 MED ORDER — FENTANYL CITRATE (PF) 100 MCG/2ML IJ SOLN
INTRAMUSCULAR | Status: AC
  Filled 2021-04-08: qty 2

## 2021-04-08 MED ORDER — KETOROLAC TROMETHAMINE 15 MG/ML IJ SOLN
15.0000 mg | Freq: Four times a day (QID) | INTRAMUSCULAR | Status: DC
  Administered 2021-04-08 – 2021-04-09 (×3): 15 mg via INTRAVENOUS
  Filled 2021-04-08 (×3): qty 1

## 2021-04-08 MED ORDER — CEFAZOLIN SODIUM-DEXTROSE 1-4 GM/50ML-% IV SOLN
1.0000 g | Freq: Three times a day (TID) | INTRAVENOUS | Status: AC
  Administered 2021-04-08 – 2021-04-09 (×2): 1 g via INTRAVENOUS
  Filled 2021-04-08 (×3): qty 50

## 2021-04-08 MED ORDER — GABAPENTIN 300 MG PO CAPS
300.0000 mg | ORAL_CAPSULE | ORAL | Status: AC
  Administered 2021-04-08: 300 mg via ORAL
  Filled 2021-04-08: qty 1

## 2021-04-08 MED ORDER — OXYCODONE HCL 5 MG PO TABS
5.0000 mg | ORAL_TABLET | ORAL | Status: DC | PRN
  Administered 2021-04-08: 5 mg via ORAL
  Administered 2021-04-09: 10 mg via ORAL
  Filled 2021-04-08: qty 1
  Filled 2021-04-08: qty 2

## 2021-04-08 MED ORDER — CELECOXIB 200 MG PO CAPS
200.0000 mg | ORAL_CAPSULE | ORAL | Status: AC
  Administered 2021-04-08: 200 mg via ORAL
  Filled 2021-04-08: qty 1

## 2021-04-08 MED ORDER — ROCURONIUM BROMIDE 10 MG/ML (PF) SYRINGE
PREFILLED_SYRINGE | INTRAVENOUS | Status: DC | PRN
  Administered 2021-04-08: 10 mg via INTRAVENOUS
  Administered 2021-04-08: 90 mg via INTRAVENOUS
  Administered 2021-04-08: 10 mg via INTRAVENOUS
  Administered 2021-04-08: 20 mg via INTRAVENOUS
  Administered 2021-04-08 (×2): 10 mg via INTRAVENOUS

## 2021-04-08 MED ORDER — HEPARIN SODIUM (PORCINE) 5000 UNIT/ML IJ SOLN
5000.0000 [IU] | INTRAMUSCULAR | Status: AC
  Administered 2021-04-08: 5000 [IU] via SUBCUTANEOUS
  Filled 2021-04-08: qty 1

## 2021-04-08 MED ORDER — ACETAMINOPHEN 500 MG PO TABS
1000.0000 mg | ORAL_TABLET | Freq: Once | ORAL | Status: DC
Start: 2021-04-08 — End: 2021-04-08

## 2021-04-08 MED ORDER — HYDROMORPHONE HCL 1 MG/ML IJ SOLN
INTRAMUSCULAR | Status: DC | PRN
  Administered 2021-04-08: .4 mg via INTRAVENOUS

## 2021-04-08 MED ORDER — PROPOFOL 10 MG/ML IV BOLUS
INTRAVENOUS | Status: DC | PRN
  Administered 2021-04-08: 150 mg via INTRAVENOUS
  Administered 2021-04-08: 30 mg via INTRAVENOUS

## 2021-04-08 MED ORDER — INDOCYANINE GREEN 25 MG IV SOLR
INTRAVENOUS | Status: AC
  Filled 2021-04-08: qty 10

## 2021-04-08 MED ORDER — ZOLPIDEM TARTRATE 5 MG PO TABS: 5.0000 mg | ORAL_TABLET | Freq: Every evening | ORAL | Status: DC | PRN

## 2021-04-08 MED ORDER — CEFAZOLIN SODIUM-DEXTROSE 2-4 GM/100ML-% IV SOLN
2.0000 g | INTRAVENOUS | Status: AC
  Administered 2021-04-08 (×2): 2 g via INTRAVENOUS
  Filled 2021-04-08: qty 100

## 2021-04-08 MED ORDER — DEXAMETHASONE SODIUM PHOSPHATE 10 MG/ML IJ SOLN
INTRAMUSCULAR | Status: DC | PRN
  Administered 2021-04-08: 10 mg via INTRAVENOUS

## 2021-04-08 MED ORDER — HYDRALAZINE HCL 20 MG/ML IJ SOLN: 5.0000 mg | INTRAMUSCULAR | Status: DC | PRN

## 2021-04-08 MED ORDER — BUPIVACAINE LIPOSOME 1.3 % IJ SUSP
INTRAMUSCULAR | Status: DC | PRN
  Administered 2021-04-08: 20 mL

## 2021-04-08 MED ORDER — CEFAZOLIN SODIUM 1 G IJ SOLR
INTRAMUSCULAR | Status: AC
  Filled 2021-04-08: qty 20

## 2021-04-08 MED ORDER — LIDOCAINE 2% (20 MG/ML) 5 ML SYRINGE
INTRAMUSCULAR | Status: DC | PRN
  Administered 2021-04-08: 100 mg via INTRAVENOUS

## 2021-04-08 MED ORDER — LISINOPRIL-HYDROCHLOROTHIAZIDE 10-12.5 MG PO TABS: 1.0000 | ORAL_TABLET | Freq: Every day | ORAL | Status: DC

## 2021-04-08 MED ORDER — HYDROCHLOROTHIAZIDE 12.5 MG PO TABS
12.5000 mg | ORAL_TABLET | Freq: Every day | ORAL | Status: DC
  Administered 2021-04-09: 12.5 mg via ORAL
  Filled 2021-04-08: qty 1

## 2021-04-08 MED ORDER — POLYETHYLENE GLYCOL 3350 17 GM/SCOOP PO POWD: 1.0000 | Freq: Once | ORAL | Status: DC

## 2021-04-08 MED ORDER — BUPIVACAINE LIPOSOME 1.3 % IJ SUSP
INTRAMUSCULAR | Status: AC
  Filled 2021-04-08: qty 20

## 2021-04-08 MED ORDER — PHENYLEPHRINE 40 MCG/ML (10ML) SYRINGE FOR IV PUSH (FOR BLOOD PRESSURE SUPPORT)
PREFILLED_SYRINGE | INTRAVENOUS | Status: DC | PRN
  Administered 2021-04-08: 160 ug via INTRAVENOUS
  Administered 2021-04-08: 80 ug via INTRAVENOUS

## 2021-04-08 MED ORDER — MIDAZOLAM HCL 2 MG/2ML IJ SOLN
INTRAMUSCULAR | Status: DC | PRN
  Administered 2021-04-08: 2 mg via INTRAVENOUS

## 2021-04-08 MED ORDER — SODIUM CHLORIDE 0.9 % IR SOLN
Status: DC | PRN
  Administered 2021-04-08: 1000 mL

## 2021-04-08 MED ORDER — CELECOXIB 200 MG PO CAPS
200.0000 mg | ORAL_CAPSULE | Freq: Once | ORAL | Status: DC
Start: 2021-04-08 — End: 2021-04-08

## 2021-04-08 MED ORDER — ALBUMIN HUMAN 5 % IV SOLN: INTRAVENOUS | Status: DC | PRN

## 2021-04-08 MED ORDER — MIDAZOLAM HCL 2 MG/2ML IJ SOLN
INTRAMUSCULAR | Status: AC
  Filled 2021-04-08: qty 2

## 2021-04-08 MED ORDER — INSULIN ASPART 100 UNIT/ML IJ SOLN
0.0000 [IU] | Freq: Three times a day (TID) | INTRAMUSCULAR | Status: DC
Start: 2021-04-09 — End: 2021-04-09

## 2021-04-08 MED ORDER — LISINOPRIL 10 MG PO TABS
10.0000 mg | ORAL_TABLET | Freq: Every day | ORAL | Status: DC
  Administered 2021-04-09: 10 mg via ORAL
  Filled 2021-04-08: qty 1

## 2021-04-08 MED ORDER — ONDANSETRON HCL 4 MG/2ML IJ SOLN: 4.0000 mg | Freq: Four times a day (QID) | INTRAMUSCULAR | Status: DC | PRN

## 2021-04-08 MED ORDER — ACETAMINOPHEN 500 MG PO TABS
1000.0000 mg | ORAL_TABLET | ORAL | Status: AC
  Administered 2021-04-08: 1000 mg via ORAL
  Filled 2021-04-08: qty 2

## 2021-04-08 MED ORDER — BUPIVACAINE HCL 0.25 % IJ SOLN
INTRAMUSCULAR | Status: DC | PRN
  Administered 2021-04-08: 35 mL

## 2021-04-08 MED ORDER — SCOPOLAMINE 1 MG/3DAYS TD PT72: 1.0000 | MEDICATED_PATCH | TRANSDERMAL | Status: DC

## 2021-04-08 MED ORDER — ORAL CARE MOUTH RINSE: 15.0000 mL | Freq: Once | OROMUCOSAL | Status: AC

## 2021-04-08 MED ORDER — ONDANSETRON HCL 4 MG/2ML IJ SOLN
INTRAMUSCULAR | Status: DC | PRN
  Administered 2021-04-08: 4 mg via INTRAVENOUS

## 2021-04-08 MED ORDER — HYDROMORPHONE HCL 2 MG/ML IJ SOLN
INTRAMUSCULAR | Status: AC
  Filled 2021-04-08: qty 1

## 2021-04-08 MED ORDER — SUGAMMADEX SODIUM 200 MG/2ML IV SOLN
INTRAVENOUS | Status: DC | PRN
  Administered 2021-04-08: 250 mg via INTRAVENOUS

## 2021-04-08 MED ORDER — HYDROMORPHONE HCL 1 MG/ML IJ SOLN: 0.5000 mg | INTRAMUSCULAR | Status: DC | PRN

## 2021-04-08 MED ORDER — PROMETHAZINE HCL 25 MG/ML IJ SOLN: 6.2500 mg | INTRAMUSCULAR | Status: DC | PRN

## 2021-04-08 SURGICAL SUPPLY — 84 items
BAG COUNTER SPONGE SURGICOUNT (BAG) IMPLANT
BAG URINE DRAIN 2000ML AR STRL (UROLOGICAL SUPPLIES) IMPLANT
CHLORAPREP W/TINT 26 (MISCELLANEOUS) ×2 IMPLANT
CLIP LIGATING HEM O LOK PURPLE (MISCELLANEOUS) ×2 IMPLANT
CLIP LIGATING HEMO LOK XL GOLD (MISCELLANEOUS) IMPLANT
CLIP LIGATING HEMO O LOK GREEN (MISCELLANEOUS) IMPLANT
COVER BACK TABLE 60X90IN (DRAPES) ×2 IMPLANT
COVER SURGICAL LIGHT HANDLE (MISCELLANEOUS) ×2 IMPLANT
COVER TIP SHEARS 8 DVNC (MISCELLANEOUS) ×1 IMPLANT
COVER TIP SHEARS 8MM DA VINCI (MISCELLANEOUS) ×1
DECANTER SPIKE VIAL GLASS SM (MISCELLANEOUS) ×1 IMPLANT
DERMABOND ADVANCED (GAUZE/BANDAGES/DRESSINGS) ×1
DERMABOND ADVANCED .7 DNX12 (GAUZE/BANDAGES/DRESSINGS) ×1 IMPLANT
DRAIN CHANNEL RND F F (WOUND CARE) IMPLANT
DRAPE ARM DVNC X/XI (DISPOSABLE) ×4 IMPLANT
DRAPE COLUMN DVNC XI (DISPOSABLE) ×1 IMPLANT
DRAPE DA VINCI XI ARM (DISPOSABLE) ×4
DRAPE DA VINCI XI COLUMN (DISPOSABLE) ×1
DRAPE INCISE IOBAN 66X45 STRL (DRAPES) ×1 IMPLANT
DRAPE SHEET LG 3/4 BI-LAMINATE (DRAPES) ×3 IMPLANT
DRAPE SURG IRRIG POUCH 19X23 (DRAPES) ×2 IMPLANT
ELECT PENCIL ROCKER SW 15FT (MISCELLANEOUS) ×2 IMPLANT
ELECT REM PT RETURN 15FT ADLT (MISCELLANEOUS) ×2 IMPLANT
GAUZE 4X4 16PLY ~~LOC~~+RFID DBL (SPONGE) ×1 IMPLANT
GLOVE SURG ENC MOIS LTX SZ6 (GLOVE) ×4 IMPLANT
GLOVE SURG ENC MOIS LTX SZ6.5 (GLOVE) ×3 IMPLANT
GLOVE SURG ENC TEXT LTX SZ7.5 (GLOVE) ×6 IMPLANT
GOWN STRL REUS W/ TWL LRG LVL3 (GOWN DISPOSABLE) IMPLANT
GOWN STRL REUS W/TWL LRG LVL3 (GOWN DISPOSABLE) ×5 IMPLANT
GOWN STRL REUS W/TWL XL LVL3 (GOWN DISPOSABLE) ×4 IMPLANT
HOLDER FOLEY CATH W/STRAP (MISCELLANEOUS) ×1 IMPLANT
IRRIG SUCT STRYKERFLOW 2 WTIP (MISCELLANEOUS) ×2
IRRIGATION SUCT STRKRFLW 2 WTP (MISCELLANEOUS) ×1 IMPLANT
KIT BASIN OR (CUSTOM PROCEDURE TRAY) ×2 IMPLANT
KIT PROCEDURE DA VINCI SI (MISCELLANEOUS) ×1
KIT PROCEDURE DVNC SI (MISCELLANEOUS) IMPLANT
KIT TURNOVER KIT A (KITS) IMPLANT
MANIPULATOR ADVINCU DEL 3.5 PL (MISCELLANEOUS) ×1 IMPLANT
MARKER SKIN DUAL TIP RULER LAB (MISCELLANEOUS) ×2 IMPLANT
MESH Y UPSYLON VAGINAL (Mesh General) ×1 IMPLANT
NDL HYPO 21X1.5 SAFETY (NEEDLE) IMPLANT
NDL SPNL 18GX3.5 QUINCKE PK (NEEDLE) IMPLANT
NEEDLE HYPO 21X1.5 SAFETY (NEEDLE) ×2 IMPLANT
NEEDLE SPNL 18GX3.5 QUINCKE PK (NEEDLE) ×2 IMPLANT
OBTURATOR OPTICAL STANDARD 8MM (TROCAR) ×1
OBTURATOR OPTICAL STND 8 DVNC (TROCAR) ×1
OBTURATOR OPTICALSTD 8 DVNC (TROCAR) IMPLANT
OCCLUDER COLPOPNEUMO (BALLOONS) IMPLANT
PACK ROBOT GYN CUSTOM WL (TRAY / TRAY PROCEDURE) ×1 IMPLANT
PACKING VAGINAL (PACKING) ×2 IMPLANT
PAD OB MATERNITY 4.3X12.25 (PERSONAL CARE ITEMS) ×1 IMPLANT
PAD POSITIONING PINK XL (MISCELLANEOUS) ×2 IMPLANT
PANTS MESH DISP LRG (UNDERPADS AND DIAPERS) IMPLANT
PANTS MESH DISPOSABLE L (UNDERPADS AND DIAPERS) ×1
PORT ACCESS TROCAR AIRSEAL 12 (TROCAR) IMPLANT
PORT ACCESS TROCAR AIRSEAL 5M (TROCAR) ×1
POUCH SPECIMEN RETRIEVAL 10MM (ENDOMECHANICALS) IMPLANT
SEAL CANN UNIV 5-8 DVNC XI (MISCELLANEOUS) ×4 IMPLANT
SEAL XI 5MM-8MM UNIVERSAL (MISCELLANEOUS) ×4
SET IRRIG Y TYPE TUR BLADDER L (SET/KITS/TRAYS/PACK) ×1 IMPLANT
SET TRI-LUMEN FLTR TB AIRSEAL (TUBING) ×1 IMPLANT
SET TUBE SMOKE EVAC HIGH FLOW (TUBING) ×1 IMPLANT
SHEET LAVH (DRAPES) ×1 IMPLANT
SOLUTION ELECTROLUBE (MISCELLANEOUS) ×2 IMPLANT
SURGILUBE 2OZ TUBE FLIPTOP (MISCELLANEOUS) IMPLANT
SUT MNCRL AB 4-0 PS2 18 (SUTURE) ×2 IMPLANT
SUT PROLENE 2 0 CT 1 (SUTURE) ×2 IMPLANT
SUT VIC AB 0 CT1 27 (SUTURE)
SUT VIC AB 0 CT1 27XBRD ANTBC (SUTURE) ×1 IMPLANT
SUT VIC AB 2-0 SH 27 (SUTURE) ×5
SUT VIC AB 2-0 SH 27XBRD (SUTURE) ×5 IMPLANT
SUT VIC AB 3-0 SH 27 (SUTURE) ×2
SUT VIC AB 3-0 SH 27X BRD (SUTURE) ×1 IMPLANT
SUT VICRYL 0 UR6 27IN ABS (SUTURE) ×2 IMPLANT
SYR 50ML LL SCALE MARK (SYRINGE) IMPLANT
SYR BULB IRRIG 60ML STRL (SYRINGE) IMPLANT
TOWEL OR 17X26 10 PK STRL BLUE (TOWEL DISPOSABLE) ×1 IMPLANT
TRAY FOLEY MTR SLVR 16FR STAT (SET/KITS/TRAYS/PACK) ×1 IMPLANT
TRAY LAPAROSCOPIC (CUSTOM PROCEDURE TRAY) ×1 IMPLANT
TROCAR ENDOPATH XCEL 12X100 BL (ENDOMECHANICALS) IMPLANT
TROCAR XCEL 12X100 BLDLESS (ENDOMECHANICALS) ×1 IMPLANT
TROCAR XCEL NON-BLD 5MMX100MML (ENDOMECHANICALS) IMPLANT
UNDERPAD 30X36 HEAVY ABSORB (UNDERPADS AND DIAPERS) ×2 IMPLANT
WATER STERILE IRR 1000ML POUR (IV SOLUTION) ×2 IMPLANT

## 2021-04-08 NOTE — Anesthesia Procedure Notes (Signed)
Procedure Name: Intubation Date/Time: 04/08/2021 8:45 AM Performed by: Niel Hummer, CRNA Pre-anesthesia Checklist: Patient identified, Emergency Drugs available, Suction available and Patient being monitored Patient Re-evaluated:Patient Re-evaluated prior to induction Oxygen Delivery Method: Circle System Utilized Preoxygenation: Pre-oxygenation with 100% oxygen Induction Type: IV induction Ventilation: Mask ventilation without difficulty Laryngoscope Size: 4 Grade View: Grade I Tube type: Oral Tube size: 7.0 mm Number of attempts: 1 Airway Equipment and Method: Stylet Placement Confirmation: ETT inserted through vocal cords under direct vision, positive ETCO2 and breath sounds checked- equal and bilateral Secured at: 20 cm Tube secured with: Tape Dental Injury: Teeth and Oropharynx as per pre-operative assessment

## 2021-04-08 NOTE — Transfer of Care (Signed)
Immediate Anesthesia Transfer of Care Note  Patient: Cathy Ho  Procedure(s) Performed: XI ROBOTIC ASSISTED LAPAROSCOPIC HYSTERECTOMY AND SALPINGECTOMY SENTINEL NODE BIOPSY XI ROBOTIC ASSISTED LAPAROSCOPIC SACROCOLPOPEXY  Patient Location: PACU  Anesthesia Type:General  Level of Consciousness: awake, alert  and oriented  Airway & Oxygen Therapy: Patient Spontanous Breathing and Patient connected to face mask oxygen  Post-op Assessment: Report given to RN, Post -op Vital signs reviewed and stable and Patient moving all extremities X 4  Post vital signs: Reviewed and stable  Last Vitals:  Vitals Value Taken Time  BP 122/76   Temp    Pulse 82 04/08/21 1322  Resp 26 04/08/21 1322  SpO2 99 % 04/08/21 1322  Vitals shown include unvalidated device data.  Last Pain:  Vitals:   04/08/21 0726  TempSrc:   PainSc: 0-No pain         Complications: No notable events documented.

## 2021-04-08 NOTE — Interval H&P Note (Signed)
History and Physical Interval Note:  04/08/2021 7:15 AM  Cathy Ho  has presented today for surgery, with the diagnosis of ENDOMETRIAL INTRAEPITHELIAL NEOPLASIA.  The various methods of treatment have been discussed with the patient and family. After consideration of risks, benefits and other options for treatment, the patient has consented to  Procedure(s): XI ROBOTIC ASSISTED LAPAROSCOPIC HYSTERECTOMY AND SALPINGECTOMY (N/A) SENTINEL NODE BIOPSY (N/A) POSSIBLE LYMPH NODE DISSECTION AND POSSIBLE LAPAROTOMY (N/A) XI ROBOTIC ASSISTED LAPAROSCOPIC SACROCOLPOPEXY (N/A) as a surgical intervention.  The patient's history has been reviewed, patient examined, no change in status, stable for surgery.  I have reviewed the patient's chart and labs.  Questions were answered to the patient's satisfaction.     Lafonda Mosses

## 2021-04-08 NOTE — Discharge Instructions (Signed)

## 2021-04-08 NOTE — H&P (Signed)
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  She also has pelvic organ prolapse.  After reviewing management options the patient opted to proceed with radical hysterectomy.  Because of her cystocele, I met with the patient as well to discuss sacrocolpopexy concomitantly.  The patient has minimal voiding symptoms.  She feels if she empties her bladder well.  Her stream is strong.  She does have some discomfort and pressure in her vagina.  She does not have any painful intercourse.  She denies any bowel dysfunction.  The patient is a diabetic, but this seems well controlled, she has otherwise reasonably healthy.  Review of systems: A 12 point comprehensive review of systems was obtained and is negative unless otherwise stated in the history of present illness.  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   Vitamin D deficiency 03/23/2020   Depression 02/11/2020   Type 2 diabetes mellitus with microalbuminuria, without long-term current use of insulin ([place]) 01/13/2020   Class 3 severe obesity with serious comorbidity and body mass index (BMI) of 45.0 to 49.9 in adult ([place]) 10/14/2019   Candidiasis of vagina 10/14/2019   Hemoglobin low 10/14/2019   Hypertension associated with type 2 diabetes mellitus ([place]) 09/01/2017   Uncontrolled type 2 diabetes mellitus with hyperglycemia ([place]) 09/01/2017   Abscess of neck 09/01/2017   Benign neoplasm of colon 08/15/2013   Internal hemorrhoids without mention of complication 81/15/7262   Rectal bleeding 07/02/2013   Hidradenitis suppurativa 10/06/2008   Keloid scar 08/04/2008    No current facility-administered  medications on file prior to encounter.   Current Outpatient Medications on File Prior to Encounter  Medication Sig Dispense Refill   alclomethasone (ACLOVATE) 0.05 % cream Apply 1 application topically 1 (one) to 2 (two) times daily as needed for rash. 180 g 3   Cholecalciferol (SM VITAMIN D3) 100 MCG (4000 UT) CAPS Take 1 capsule (4,000 Units total) by mouth daily. 30 capsule 0   docusate sodium (COLACE) 100 MG capsule Take 1 capsule (100 mg total) by mouth 2 (two) times daily as needed for mild constipation or moderate constipation. 30 capsule 2   ferrous sulfate 325 (65 FE) MG tablet Take 325 mg by mouth daily with breakfast. prn     lisinopril-hydrochlorothiazide (ZESTORETIC) 10-12.5 MG tablet Take 1 tablet by mouth daily. 90 tablet 3   mupirocin ointment (BACTROBAN) 2 % Apply 1 application topically 2 (two) times daily. 22 g 6   naproxen sodium (ANAPROX) 550 MG tablet Take 1 tablet (550 mg total) by mouth 2 (two) times daily with a meal as needed for pain and as instructed around periods 30 tablet 2   oxyCODONE (OXY IR/ROXICODONE) 5 MG immediate release tablet Take 1 tablet (5 mg total) by mouth every 4 (four) hours as needed for severe pain or breakthrough pain. 30 tablet 0   ibuprofen (ADVIL) 800 MG tablet Take 1 tablet (800 mg total) by mouth every 8 (eight) hours as needed for moderate pain. For AFTER surgery only 30 tablet 0   senna-docusate (SENOKOT-S) 8.6-50 MG tablet Take 2 tablets by mouth at bedtime. For AFTER surgery, do not take if having diarrhea 30 tablet 0   tranexamic acid (LYSTEDA)  650 MG TABS tablet TAKE 2 TABLETS (1,300 MG TOTAL) BY MOUTH 3 (THREE) TIMES DAILY. TAKE DURING MENSES FOR A MAXIMUM OF FIVE DAYS 30 tablet 2    Past Medical History:  Diagnosis Date   Anemia    Arthritis    Back pain    Chest pain    COVID 08/2020   Runny nose, fever, HA symptoms resolved   Diabetes mellitus without complication ([place]) 1610   type 2   Fibroids 2010   Hidradenitis  suppurativa    Hypertension    Joint pain    Low iron    PCOS (polycystic ovarian syndrome)    Prolapsed uterus 2010   Sleep apnea    cpap, pt does not know settings   Vitamin D deficiency    Wears glasses     Past Surgical History:  Procedure Laterality Date   carbuckle cyst in back removed  02/2005   COLONOSCOPY N/A 08/15/2013   Procedure: COLONOSCOPY;  Surgeon: Inda Castle, MD;  Location: [facility];  Service: Endoscopy;  Laterality: N/A;   DILATION AND CURETTAGE OF UTERUS  2011   Endometrial hyperplasia with atypia, submucosal fibroid   HYSTEROSCOPY  2011   HYSTEROSCOPY WITH D & C N/A 03/03/2021   Procedure: DILATATION AND CURETTAGE /HYSTEROSCOPY;  Surgeon: Osborne Oman, MD;  Location: [facility];  Service: Gynecology;  Laterality: N/A;   WISDOM TOOTH EXTRACTION  1998    Social History   Tobacco Use   Smoking status: Never   Smokeless tobacco: Never  Vaping Use   Vaping Use: Never used  Substance Use Topics   Alcohol use: Yes    Comment: occasional   Drug use: No    Family History  Problem Relation Age of Onset   Hypertension Mother    Diabetes Mother    Obesity Mother    Hypertension Father    Diabetes Father    Sleep apnea Father    Obesity Father    Seizures Sister    Stroke Maternal Aunt    Diabetes Maternal Aunt    Hypertension Maternal Aunt    Diabetes Maternal Uncle    Hypertension Maternal Grandfather    Heart disease Maternal Grandfather    Diabetes Maternal Grandfather    Diabetes Paternal Grandmother    Heart disease Paternal Grandmother    Breast cancer Cousin    Colon cancer Neg Hx    Liver disease Neg Hx    Kidney disease Neg Hx    Ovarian cancer Neg Hx    Endometrial cancer Neg Hx    Pancreatic cancer Neg Hx    Prostate cancer Neg Hx     PE: Vitals:   04/08/21 0727  Weight: 106.1 kg  Height: 5' 4"  (1.626 m)   Patient appears to be in no acute distress  patient is alert and oriented x3 Atraumatic  normocephalic head No cervical or supraclavicular lymphadenopathy appreciated No increased work of breathing, no audible wheezes/rhonchi Regular sinus rhythm/rate Abdomen is soft, nontender, nondistended, no CVA or suprapubic tenderness Lower extremities are symmetric without appreciable edema Grossly neurologically intact No identifiable skin lesions  No results for input(s): WBC, HGB, HCT in the last 72 hours. No results for input(s): NA, K, CL, CO2, GLUCOSE, BUN, CREATININE, CALCIUM in the last 72 hours. No results for input(s): LABPT, INR in the last 72 hours. No results for input(s): LABURIN in the last 72 hours. Results for orders placed or performed during the hospital encounter of 04/08/21  Urine Culture     Status: Abnormal   Collection Time: 03/25/21 10:28 AM   Specimen: Urine, Clean Catch  Result Value Ref Range Status   Specimen Description   Final    URINE, CLEAN CATCH Performed at [facility], [place] [address] [place], [place] [address]    Special Requests   Final    Normal Performed at [facility], [place] [address] [place], [place] [address]    Culture (A)  Final    <10,000 COLONIES/mL INSIGNIFICANT GROWTH Performed at [facility] [address] [place], [place] [address]    Report Status 03/26/2021 FINAL  Final    Imaging: none  Imp: Anterior vault prolapse  Recommendations: The patient I discussed treatment options.  I offered sacrocolpopexy for her, especially since we could do this at the same time as her hysterectomy.  That would make things quite easy for her, we can take care of both problems at once.  It is mild, but she is already symptomatic.  Suspect over time if not taken care of this will progress.  Given this, I recommended that we proceed.  I discussed the risk and the benefits of the operation.  I discussed the aspects of recovery.  I explained the associated risk of mesh complications.  We also  discussed the risks of failure as well as damage to the surrounding structures including the rectum and that bladder.  Having gone through all the risk and the benefits of the operation and answered all her questions, she is opted to proceed.  Ardis Hughs

## 2021-04-08 NOTE — Progress Notes (Signed)
MD Louis Meckel called and gave verbal orders to check CBG ACHS and place patient on moderate coverage sliding scale.

## 2021-04-08 NOTE — H&P (Signed)
Gynecologic Oncology H&P  04/08/21   HISTORY OF PRESENT ILLNESS:  Cathy Ho is a 40 y.o. woman who presents today for definitive surgery in the setting of EIN and pelvic organ prolapse.  Patient reports a long history of irregular cycles before achieving weight loss.  She describes this as having very irregular menses, often going multiple months between episodes of bleeding, menses lasting for more than 5 days, and heavy bleeding.  Since the beginning of 2020, the patient has been able to achieve about 80 pounds of weight loss through the health and wellness program here at [facility] health.  This has been mostly related to diet and medication changes.  With her weight loss, she has had normalization of her menses with periods that now occur on average every month that are more predictable in terms of their duration and quantity.   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  Patient had a Mirena IUD placed at that time, in [place], and a subsequent biopsy in the fall 2011 was negative for any hyperplasia, atypia, or malignancy.  Patient notes that she had multiple Mirena IUDs placed but that the IUD "would not stay in".  While some records suggest that she was on high-dose progesterone, the patient does not remember taking oral progesterone.  It sounds like she has not had treatment for CAH in recent years.  She does have a prescription for Lysteda that she uses for heavy bleeding with her cycles.   Most recently, EMB on 10/21 showed microscopic fragments with CAH, abundant disordered proliferative endometrium. The patient then underwent D&C with findings of EIN, underlying cancer could not be ruled out.   Patient reports having a good appetite without nausea or emesis.  She endorses normal bowel and bladder function.   Medical history is notable for well-controlled type 2 diabetes.  Most recent hemoglobin A1c was 5.6% and fasting CBGs are less than 100 when she  checks them at home.  Patient also has hypertension, obstructive sleep apnea (has a CPAP although does not use it regularly), and hidradenitis suppurativa.  Patient has both a maternal cousin and maternal aunt with a history of breast cancer.  She denies any family history of GYN or colon cancer.   Patient lives in [place] with her husband.  She denies any tobacco use, reports occasional alcohol use.  TREATMENT HISTORY: Oncology History   No history exists.    PAST MEDICAL HISTORY: Past Medical History:  Diagnosis Date   Anemia    Arthritis    Back pain    Chest pain    COVID 08/2020   Runny nose, fever, HA symptoms resolved   Diabetes mellitus without complication ([place]) 4665   type 2   Fibroids 2010   Hidradenitis suppurativa    Hypertension    Joint pain    Low iron    PCOS (polycystic ovarian syndrome)    Prolapsed uterus 2010   Sleep apnea    cpap, pt does not know settings   Vitamin D deficiency    Wears glasses     PAST SURGICAL HISTORY: Past Surgical History:  Procedure Laterality Date   carbuckle cyst in back removed  02/2005   COLONOSCOPY N/A 08/15/2013   Procedure: COLONOSCOPY;  Surgeon: Inda Castle, MD;  [facility];  Service: Endoscopy;  Laterality: N/A;   DILATION AND CURETTAGE OF UTERUS  2011   Endometrial hyperplasia with atypia, submucosal fibroid   HYSTEROSCOPY  2011  HYSTEROSCOPY WITH D & C N/A 03/03/2021   Procedure: DILATATION AND CURETTAGE /HYSTEROSCOPY;  Surgeon: Osborne Oman, MD;  Location: [facility];  Service: Gynecology;  Laterality: N/A;   [place] EXTRACTION  1998    OB/GYN HISTORY: OB History  Gravida Para Term Preterm AB Living  0 0          SAB IAB Ectopic Multiple Live Births               MEDICATIONS:  Current Facility-Administered Medications:    acetaminophen (TYLENOL) tablet 1,000 mg, 1,000 mg, Oral, On Call to OR, Cross, Melissa D, NP   ceFAZolin (ANCEF) IVPB 2g/100 mL premix, 2 g,  Intravenous, 30 min Pre-Op, Ardis Hughs, MD   celecoxib (CELEBREX) capsule 200 mg, 200 mg, Oral, On Call to OR, Cross, Melissa D, NP   chlorhexidine (PERIDEX) 0.12 % solution 15 mL, 15 mL, Mouth/Throat, Once **OR** MEDLINE mouth rinse, 15 mL, Mouth Rinse, Once, Gifford Shave, Lynnae January, MD   dexamethasone (DECADRON) injection 4 mg, 4 mg, Intravenous, On Call to OR, Cross, Melissa D, NP   gabapentin (NEURONTIN) capsule 300 mg, 300 mg, Oral, On Call to OR, Cross, Melissa D, NP   heparin injection 5,000 Units, 5,000 Units, Subcutaneous, 120 min pre-op, Cross, Melissa D, NP   lactated ringers infusion, , Intravenous, Continuous, Gifford Shave, Lynnae January, MD   scopolamine (TRANSDERM-SCOP) 1 MG/3DAYS 1.5 mg, 1 patch, Transdermal, Q72H, Duane Boston, MD  ALLERGIES: No Known Allergies  FAMILY HISTORY: Family History  Problem Relation Age of Onset   Hypertension Mother    Diabetes Mother    Obesity Mother    Hypertension Father    Diabetes Father    Sleep apnea Father    Obesity Father    Seizures Sister    Stroke Maternal Aunt    Diabetes Maternal Aunt    Hypertension Maternal Aunt    Diabetes Maternal Uncle    Hypertension Maternal Grandfather    Heart disease Maternal Grandfather    Diabetes Maternal Grandfather    Diabetes Paternal Grandmother    Heart disease Paternal Grandmother    Breast cancer Cousin    Colon cancer Neg Hx    Liver disease Neg Hx    Kidney disease Neg Hx    Ovarian cancer Neg Hx    Endometrial cancer Neg Hx    Pancreatic cancer Neg Hx    Prostate cancer Neg Hx     SOCIAL HISTORY: Social History   Socioeconomic History   Marital status: Married    Spouse name: Not on file   Number of children: Not on file   Years of education: Not on file   Highest education level: Not on file  Occupational History   Occupation: Biomedical engineer and student  Tobacco Use   Smoking status: Never   Smokeless tobacco: Never  Vaping Use   Vaping Use: Never used   Substance and Sexual Activity   Alcohol use: Yes    Comment: occasional   Drug use: No   Sexual activity: Yes    Birth control/protection: None  Other Topics Concern   Not on file  Social History Narrative   Not on file   Social Determinants of Health   Financial Resource Strain: Not on file  Food Insecurity: Not on file  Transportation Needs: Not on file  Physical Activity: Not on file  Stress: Not on file  Social Connections: Not on file  Intimate Partner Violence: Not on file  REVIEW OF SYSTEMS: Denies appetite changes, fevers, chills, fatigue, unexplained weight changes. Denies hearing loss, neck lumps or masses, mouth sores, ringing in ears or voice changes. Denies cough or wheezing.  Denies shortness of breath. Denies chest pain or palpitations. Denies leg swelling. Denies abdominal distention, pain, blood in stools, constipation, diarrhea, nausea, vomiting, or early satiety. Denies pain with intercourse, dysuria, frequency, hematuria or incontinence. Denies hot flashes, pelvic pain, vaginal bleeding or vaginal discharge.   Denies joint pain, back pain or muscle pain/cramps. Denies itching, rash, or wounds. Denies dizziness, headaches, numbness or seizures. Denies swollen lymph nodes or glands, denies easy bruising or bleeding. Denies anxiety, depression, confusion, or decreased concentration.   PHYSICAL EXAM: Vitals with BMI 03/25/2021 03/09/2021 03/08/2021  Height 5\' 4"  5\' 4"  5\' 4"   Weight 234 lbs 234 lbs 236 lbs 13 oz  BMI 40.15 16.10 96.04  Systolic 540 981 191  Diastolic 92 74 74  Pulse 88 74 75   General: Alert, oriented, no acute distress.  HEENT: Normocephalic, atraumatic. Sclera anicteric.  Chest: Clear to auscultation bilaterally. No wheezes, rhonchi, or rales. Cardiovascular: Regular rate and rhythm, no murmurs, rubs, or gallops.  Abdomen: Obese. Normoactive bowel sounds. Soft, nondistended, nontender to palpation. No masses or hepatosplenomegaly  appreciated. No palpable fluid wave.  Extremities: Grossly normal range of motion. Warm, well perfused. No edema bilaterally.  LABORATORY AND RADIOLOGIC DATA: Endometrial curettings on 03/03/21: At least EIN with squamous morular metaplasia. Focal areas are concerning for endometrioid adenocarcinoma.   Pelvic ultrasound on 07/28/20: Uterus Measurements: 8.4 x 4.5 x 6 cm = volume: 120 mL. A fibroid is noted at the uterine fundus measuring 1.8 x 1.1 x 0.9 cm. This fibroid appears to be intramural in location. Endometrium Thickness: 6 mm. There is an endometrial polyp measuring 1.1 x 0.6 x 0.8 cm. Right ovary Measurements: 3.9 x 2.8 x 2.8 cm = volume: 16 mL. Normal appearance/no adnexal mass. Left ovary Measurements: 3.9 x 2.6 x 3.6 cm = volume: 20 mL. Normal appearance/no adnexal mass. Other findings There is a trace volume of pelvic free fluid. IMPRESSION: 1. Possible endometrial polyp versus endometrial hyperplasia. Gynecologic consultation is recommended. 2. Small uterine fibroid as detailed above  ASSESSMENT: Aki Burdin is a 40 y.o. premenopausal patient with at least EIN, pelvic organ prolapse.  PLAN: Robotic definitive surgery for EIN and concurrent pelvic organ prolapse surgery with Louis Meckel.   Jeral Pinch, MD  Division of Gynecologic Oncology  Department of Obstetrics and Gynecology  [facility]

## 2021-04-08 NOTE — Anesthesia Postprocedure Evaluation (Signed)
Anesthesia Post Note  Patient: Cathy Ho  Procedure(s) Performed: XI ROBOTIC ASSISTED LAPAROSCOPIC HYSTERECTOMY AND SALPINGECTOMY SENTINEL NODE BIOPSY XI ROBOTIC ASSISTED LAPAROSCOPIC SACROCOLPOPEXY     Patient location during evaluation: PACU Anesthesia Type: General Level of consciousness: sedated Pain management: pain level controlled Vital Signs Assessment: post-procedure vital signs reviewed and stable Respiratory status: spontaneous breathing and respiratory function stable Cardiovascular status: stable Postop Assessment: no apparent nausea or vomiting Anesthetic complications: no   No notable events documented.  Last Vitals:  Vitals:   04/08/21 1430 04/08/21 1445  BP: 132/88 131/79  Pulse: 81 83  Resp: 16 19  Temp:    SpO2: 100% 92%    Last Pain:  Vitals:   04/08/21 1445  TempSrc:   PainSc: 0-No pain                 Bailynn Dyk DANIEL

## 2021-04-08 NOTE — Op Note (Signed)
Preoperative diagnosis:  Pelvic organ prolapse   Postoperative diagnosis:  same   Procedure: Robotic assisted laparoscopic sacrocolpopexy  Surgeon: Ardis Hughs, MD 1st assistant: Debbrah Alar, PA-C  Anesthesia: General  Complications: None  Intraoperative findings: [place] Y-Mesh placed, cut to specific vaginal length  EBL: 50cc  Specimens: None  Indication: Cathy Ho is a 40 y.o. female patient with symptomatic pelvic organ prolapse as well as EIN of the endometrium.  She is scheduled to undergo a radical robotic assisted hysterectomy with node sampling.  Simultaneously, I offered her sacrocolpopexy.  After reviewing the management options for treatment, she elected to proceed with the above surgical procedure(s). We have discussed the potential benefits and risks of the procedure, side effects of the proposed treatment, the likelihood of the patient achieving the goals of the procedure, and any potential problems that might occur during the procedure or recuperation. Informed consent has been obtained.  Description of procedure:  The patient was under anesthesia and the ports were placed by Dr. Berline Lopes.  Dr. Berline Lopes completed the radical hysterectomy and closed the vaginal cuff.  I subsequently took over the surgery and my assistant and I performed a sacrocolpopexy.  I began by dissecting the posterior plane between the vagina and the rectum.  I carried this dissection down as far as I could to along the area of the perineal body.  I then turned my attention to the anterior plane between the anterior vaginal wall and the bladder.  I was able to obtain access to the avascular plane and with a combination of both monopolar cautery and blunt dissection was able to clean and nice down to the bladder neck.  During the anterior dissection, there was noted that the bladder wall was fairly thin.  I filled the bladder up with 200 cc of saline and there were no  leaks.  However I did opt to reinforce the area midline near the trigone and with a 3-0 Vicryl reapproximated some of the detrusor fibers behind this area.  Mesh was measured at approximately 5.5cm anteriorly and 7.5 cm posteriorly and I cut this on the back table.  The mesh was then placed into the patient's abdomen through the assistant port and the anterior leaf was secured down onto the anterior vaginal wall with the apex at the vaginal cuff.  the posterior leaf was then secured down on the posterior vaginal wall.  These were sewn down with 2-0 Vicryl.  Between 6 and 8 were done on each side.  I then went back to the previously dissected sacral promontory and posterior peritoneal tunnel and inserted a instrument through the tunnel and grasped the end of the mesh at the vaginal cuff and pull it up to the sacrum.  I then checked to ensure that the sacral mesh was not too tight by performing a vaginal exam.  I then secured the sacral leg of the mesh using a 0 Prolene.  I then reapproximated the posterior peritoneum with a 2-0 Vicryl in a running fashion around the sacral promontory.  The pelvic peritoneum was closed using a pursestring.   The fascia of the 12 mm port was then closed with 0 Vicryl in a figure-of-eight fashion.  The skin was closed with 4-0 Monocryl's.  Dermabond was applied to the incision and exparel injected into the incisions.  Estrace impregnated packing was then placed into her vagina which will be left in overnight.  The patient was subsequently extubated and returned to the PACU  in excellent condition.   Ardis Hughs, M.D.

## 2021-04-08 NOTE — Op Note (Signed)
OPERATIVE NOTE  Pre-operative Diagnosis: CAH, pelvic organ prolapse  Post-operative Diagnosis: same, on frozen section no evidence of hyperplasia or malignancy  Operation: Robotic-assisted laparoscopic total hysterectomy with bilateral salpingectomy, SLN biopsy on the right, failed mapping on the left (robotic sacrocolpopexy with Dr. Louis Meckel)  Surgeon: Jeral Pinch MD  Assistant Surgeon: Lahoma Crocker MD (an MD assistant was necessary for tissue manipulation, management of robotic instrumentation, retraction and positioning due to the complexity of the case and hospital policies).   Anesthesia: GET  Urine Output: pending  Operative Findings: On EUA, 10cm mobile uterus. NO adnexal masses. On intra-abdominal entry, normal upper abdominal survey. Normal omentum, small and large bowel. Some adhesions of the colon to the left sidewall and IP ligament. Normal appearing tubes and ovaries. Mapping successful to right common iliac SLN. On left, mapping seen along medial aspect of the internal iliac artery, no mapping noted within obturator space and the external iliac basin. Given adipose tissue, somewhat difficult to evaluate presacral nodes, no obvious ICG seen. NO obvious evidence of pelvic or abdominal disease.  Estimated Blood Loss:  75 cc for my portion of the procedure  Total IV Fluids: see I&O flowsheet         Specimens: uterus, cervix, bilateral tubes, right common iliac sentinel lymph node         Complications:  None apparent; patient tolerated the procedure well.         Disposition: PACU - hemodynamically stable.  Procedure Details  The patient was seen in the Holding Room. The risks, benefits, complications, treatment options, and expected outcomes were discussed with the patient.  The patient concurred with the proposed plan, giving informed consent.  The site of surgery properly noted/marked. The patient was identified as Cathy Ho and the procedure verified as a  Robotic-assisted hysterectomy with bilateral salpingectomy with SLN biopsy.   After induction of anesthesia, the patient was draped and prepped in the usual sterile manner. Patient was placed in supine position after anesthesia and draped and prepped in the usual sterile manner as follows: Her arms were tucked to her side with all appropriate precautions.  The patient was secured to the bed using strap across her chest. The patient was placed in the semi-lithotomy position in [place].  The perineum and vagina were prepped with CholoraPrep. The patient was draped after the CholoraPrep had been allowed to dry for 3 minutes.  A Time Out was held and the above information confirmed.  The urethra was prepped with Betadine. Foley catheter was placed.  A sterile speculum was placed in the vagina.  The cervix was grasped with a single-tooth tenaculum. 2mg  total of ICG was injected into the cervical stroma at 2 and 9 o'clock with 1cc injected at a 1cm and 59mm depth (concentration 0.5mg /ml) in all locations. The cervix was dilated with Kennon Rounds dilators.  The 3.5 Delineator uterine manipulator with a colpotomizer ring was placed without difficulty.  A pneum occluder balloon was placed over the manipulator.  OG tube placement was confirmed and to suction.   Next, a 10 mm skin incision was made 1 cm below the subcostal margin in the midclavicular line.  The 5 mm Optiview port and scope was used for direct entry.  Opening pressure was under 10 mm CO2.  The abdomen was insufflated and the findings were noted as above.   At this point and all points during the procedure, the patient's intra-abdominal pressure did not exceed 15 mmHg. Next, an 8 mm skin  incision was made superior to the umbilicus and a right and left port were placed about 8 cm lateral to the robot port on the right and left side.  A fourth arm was placed on the right.  The 5 mm assist trocar was exchanged for a 10-12 mm port. All ports were placed under  direct visualization.  The patient was placed in steep Trendelenburg.  Bowel was folded away into the upper abdomen.  The robot was docked in the normal manner.  The right and left peritoneum were opened parallel to the IP ligament to open the retroperitoneal spaces bilaterally. The round ligaments were transected. The SLN mapping was performed in bilateral pelvic basins. After identifying the ureters, the para rectal and paravesical spaces were opened up entirely with careful dissection below the level of the ureters bilaterally and to the depth of the uterine artery origin in order to skeletonize the uterine "web" and ensure visualization of all parametrial channels. The para-aortic basins were carefully exposed and evaluated for isolated para-aortic SLN's. Lymphatic channels were identified travelling to the following visualized sentinel lymph node's: right common iliac SLN, left channels seen along the medial leaf of the broad ligament, no obvious SLN appreciated. The SLN was separated from its surrounding lymphatic tissue, removed and sent for permanent pathology.  The hysterectomy was started.  The ureter was again noted to be on the medial leaf of the broad ligament.  The peritoneum above the ureter was incised and stretched and the infundibulopelvic ligament was skeletonized, cauterized and cut.  The posterior peritoneum was taken down to the level of the KOH ring.  The anterior peritoneum was also taken down.  The bladder flap was created to the level of the KOH ring.  The uterine artery on the right side was skeletonized, cauterized and cut in the normal manner.  A similar procedure was performed on the left.  The colpotomy was made and the uterus, cervix, bilateral tubes were amputated and delivered through the vagina.  Pedicles were inspected and excellent hemostasis was achieved.    Frozen section revealed no gross abnormality, no microscopic evidence of hyperplasia or malignancy.  The colpotomy  at the vaginal cuff was closed with Vicryl on a CT1 needle in running manner.  Irrigation was used and excellent hemostasis was achieved.  At this point the procedure was turned over to Dr. Louis Meckel.  Jeral Pinch, MD

## 2021-04-09 ENCOUNTER — Other Ambulatory Visit (HOSPITAL_COMMUNITY): Payer: Self-pay

## 2021-04-09 ENCOUNTER — Other Ambulatory Visit: Payer: No Typology Code available for payment source

## 2021-04-09 ENCOUNTER — Encounter: Payer: Self-pay | Admitting: Gynecologic Oncology

## 2021-04-09 ENCOUNTER — Encounter (HOSPITAL_COMMUNITY): Payer: Self-pay | Admitting: Gynecologic Oncology

## 2021-04-09 DIAGNOSIS — N819 Female genital prolapse, unspecified: Secondary | ICD-10-CM | POA: Diagnosis not present

## 2021-04-09 LAB — GLUCOSE, CAPILLARY
Glucose-Capillary: 95 mg/dL (ref 70–99)
Glucose-Capillary: 115 mg/dL — ABNORMAL HIGH (ref 70–99)

## 2021-04-09 LAB — SURGICAL PATHOLOGY

## 2021-04-09 MED ORDER — ESTRADIOL 0.1 MG/GM VA CREA
1.0000 | TOPICAL_CREAM | VAGINAL | 1 refills | Status: DC
  Filled 2021-04-09: qty 42.5, 21d supply, fill #0
  Filled 2021-04-21 – 2021-04-22 (×2): qty 42.5, 21d supply, fill #1

## 2021-04-09 NOTE — Discharge Summary (Signed)
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   Vitamin D deficiency 03/23/2020   Depression 02/11/2020   Type 2 diabetes mellitus with microalbuminuria, without long-term current use of insulin ([place]) 01/13/2020   Class 3 severe obesity with serious comorbidity and body mass index (BMI) of 45.0 to 49.9 in adult ([place]) 10/14/2019   Candidiasis of vagina 10/14/2019   Hemoglobin low 10/14/2019   Hypertension associated with type 2 diabetes mellitus ([place]) 09/01/2017   Uncontrolled type 2 diabetes mellitus with hyperglycemia ([place]) 09/01/2017   Abscess of neck 09/01/2017   Benign neoplasm of colon 08/15/2013   Internal hemorrhoids without mention of complication 13/11/6576   Rectal bleeding 07/02/2013   Hidradenitis suppurativa 10/06/2008   Keloid scar 08/04/2008    Procedures performed: Procedure(s): XI ROBOTIC ASSISTED LAPAROSCOPIC HYSTERECTOMY AND SALPINGECTOMY SENTINEL NODE BIOPSY XI ROBOTIC ASSISTED LAPAROSCOPIC SACROCOLPOPEXY  History and Physical: For full details, please see admission history and physical. Briefly, Cathy Ho is a 40 y.o. year old patient with abnormal uterine biopsy with menorrhagia and mid-line cystocele.   Hospital Course: Patient tolerated the procedure well.  She was then transferred to the floor after an uneventful PACU stay.  Her hospital course was uncomplicated.  On POD#1 she had met discharge criteria: was eating a regular diet, was up and ambulating independently,  pain was well controlled,  was voiding without a catheter, and was ready to for discharge.  Vitals:   04/08/21 1821 04/08/21 2101 04/09/21 0137 04/09/21 0543  BP: 126/74 118/83 115/75 122/79  Pulse: 86 80 78 77  Resp: _0 Temp: 97.7 F (36.5 C) 98.4 F (36.9 C) 98.4 F (36.9 C) 98.3 F (36.8 C)  TempSrc: Oral Oral Oral Oral  SpO2: 98% 100% 98% 99%  Weight:      Height:       Intake/Output Summary (Last 24 hours) at 04/09/2021 0736 Last data filed at 04/09/2021 0600 Gross per 24 hour  Intake 4505.32 ml  Output 1920 ml  Net 2585.32 ml    NAD Non-labored breathing Abdomen soft, incisions c/d/I Extremities symmetric   Laboratory values:  Recent Labs    04/08/21 1712  WBC 17.3*  HGB 10.1*  HCT 31.5*   Recent Labs    04/08/21 1712  NA 135  K 3.7  CL 106  CO2 18*  GLUCOSE 204*  BUN 12  CREATININE 0.73  CALCIUM 9.0   No results for input(s): LABPT, INR in the last 72 hours. No results for input(s): LABURIN in the last 72 hours. Results for orders placed or performed during the hospital encounter of 04/08/21  Urine Culture     Status: Abnormal   Collection Time: 03/25/21 10:28 AM   Specimen: Urine, Clean Catch  Result Value Ref Range Status   Specimen Description   Final    URINE, CLEAN CATCH Performed at [facility], [place] [address], [place], [place] [address]    Special Requests   Final    Normal Performed at [facility], [place] [address], [place], [place] [address]    Culture (  A)  Final    <10,000 COLONIES/mL INSIGNIFICANT GROWTH Performed at [facility] [address] [place], [place] [address]    Report Status 03/26/2021 FINAL  Final    Disposition: Home  Discharge instruction: The patient was instructed to be ambulatory but told to refrain from heavy lifting, strenuous activity, or driving.   Discharge medications:  Allergies as of 04/09/2021   No Known Allergies      Medication List     STOP taking these  medications    docusate sodium 100 MG capsule Commonly known as: COLACE   naproxen sodium 550 MG tablet Commonly known as: ANAPROX       TAKE these medications    alclomethasone 0.05 % cream Commonly known as: ACLOVATE Apply 1 application topically 1 (one) to 2 (two) times daily as needed for rash.   estradiol 0.1 MG/GM vaginal cream Commonly known as: ESTRACE VAGINAL Place 1 Applicatorful vaginally 3 (three) times a week. Use 1 small dolyp of cream on tip of index finger and swap the inside of the vagina   ibuprofen 800 MG tablet Commonly known as: ADVIL Take 1 tablet (800 mg total) by mouth every 8 (eight) hours as needed for moderate pain. For AFTER surgery only   lisinopril-hydrochlorothiazide 10-12.5 MG tablet Commonly known as: ZESTORETIC Take 1 tablet by mouth daily.   Mounjaro 10 MG/0.5ML Pen Generic drug: tirzepatide Inject 10 mg into the skin once a week.   mupirocin ointment 2 % Commonly known as: BACTROBAN Apply 1 application topically 2 (two) times daily.   oxyCODONE 5 MG immediate release tablet Commonly known as: Oxy IR/ROXICODONE Take 1 tablet (5 mg total) by mouth every 4 (four) hours as needed for severe pain or breakthrough pain.   senna-docusate 8.6-50 MG tablet Commonly known as: Senokot-S Take 2 tablets by mouth at bedtime. For AFTER surgery, do not take if having diarrhea   SM Vitamin D3 100 MCG (4000 UT) Caps Generic drug: Cholecalciferol Take 1 capsule (4,000 Units total) by mouth daily.   Xigduo XR 08-998 MG Tb24 Generic drug: Dapagliflozin-metFORMIN HCl ER Take 2 tablets by mouth every morning after a meal.        Followup:   Follow-up Information     Hollace Hayward, NP Follow up on 04/22/2021.   Why: 2p Contact information: [address] [place] [address] [phone]         Lafonda Mosses, MD. Call in 3 week(s).   Specialty: Gynecologic Oncology Contact information: [place]  [place] [address] [phone]

## 2021-04-09 NOTE — Progress Notes (Signed)
Transition of Care [facility]) Screening Note  Patient Details  Name: Michalene Debruler Date of Birth: 1980/05/03  Transition of Care [facility]) CM/SW Contact:    Sherie Don, LCSW Phone Number: 04/09/2021, 8:53 AM  Transition of Care Department [facility]) has reviewed patient and no TOC needs have been identified at this time. We will continue to monitor patient advancement through interdisciplinary progression rounds. If new patient transition needs arise, please place a TOC consult.

## 2021-04-09 NOTE — Progress Notes (Signed)
Reviewed written d/c instructions w pt and all questions answered. She verbalized understanding. D/C per w/c w all belongings in stable condition. 

## 2021-04-09 NOTE — Progress Notes (Signed)
1 Day Post-Op Procedure(s) (LRB): XI ROBOTIC ASSISTED LAPAROSCOPIC HYSTERECTOMY AND SALPINGECTOMY (N/A) SENTINEL NODE BIOPSY (N/A) XI ROBOTIC ASSISTED LAPAROSCOPIC SACROCOLPOPEXY (N/A)  Subjective: Patient reports doing well this am. Ate solid food for breakfast with no nausea or emesis. Due to void since foley removal. Pain minimal and managed with prn medications. Ambulating without difficulty. Passing flatus. No vaginal bleeding reported. No concerns voiced.   Objective: Vital signs in last 24 hours: Temp:  [97.5 F (36.4 C)-98.4 F (36.9 C)] 98.3 F (36.8 C) (12/16 0907) Pulse Rate:  [76-86] 76 (12/16 0907) Resp:  [14-20] 14 (12/16 0907) BP: (115-143)/(71-88) 127/79 (12/16 0907) SpO2:  [92 %-100 %] 98 % (12/16 0907) Last BM Date: 04/08/21  Intake/Output from previous day: 12/15 0701 - 12/16 0700 In: 4505.3 [P.O.:960; I.V.:2795.3; IV Piggyback:750] Out: 1920 [Urine:1900; Blood:20]  Physical Examination: General: alert, cooperative, and no distress Resp: clear to auscultation bilaterally Cardio: regular rate and rhythm, S1, S2 normal, no murmur, click, rub or gallop GI: soft, non-tender; bowel sounds normal; no masses,  no organomegaly and incision: clean and lap sites to the abdomen with dermabond intact without drainage Extremities: extremities normal, atraumatic, no cyanosis or edema  Labs: WBC/Hgb/Hct/Plts:  17.3/10.1/31.5/464 (12/15 1712) BUN/Cr/glu/ALT/AST/amyl/lip:  12/0.73/--/--/--/--/-- (12/15 1712)  Assessment: 40 y.o. s/p Procedure(s): XI ROBOTIC ASSISTED LAPAROSCOPIC HYSTERECTOMY AND SALPINGECTOMY SENTINEL NODE BIOPSY XI ROBOTIC ASSISTED LAPAROSCOPIC SACROCOLPOPEXY: stable Pain:  Pain is well-controlled on PRN medications.  Heme: 04/08/21: Hgb 10.1 and Hct 31.5 in the evening post-op  CV: BP and HR stable. Continue to monitor with routine vital signs until discharge.   GI:  Tolerating po: Yes. Antiemetics ordered prn.   GU: Due to void since foley  removal. Creatinine 0.73 last pm.    FEN: No critical values on recent labs.  Endo: Type 2 Diabetes. CBG: CBG (last 3)  Recent Labs    04/08/21 2059 04/09/21 0752 04/09/21 1141  GLUCAP 112* 95 115*    Prophylaxis: SCDs.   Plan: Plan for discharge once voiding Continue plan of care per Dr. Louis Meckel and Dr. Berline Lopes   LOS: 0 days    Lenna Sciara D Latecia Miler 04/09/2021, 11:22 AM

## 2021-04-09 NOTE — Progress Notes (Signed)
Patient's vaginal packing was removed.

## 2021-04-12 ENCOUNTER — Telehealth: Payer: Self-pay

## 2021-04-12 NOTE — Telephone Encounter (Signed)
Spoke with Ms. Ose this afternoon. She states she is eating, drinking and urinating well. She is having regular BM's. She is taking senokot as prescribed and encouraged her to drink plenty of water. She denies fever or chills. Incisions are dry and intact. She rates her pain as 4/10 and states it is manageable. She reports some nausea after taking pain medicine. Advised patient to take pain medication with food to avoid stomach upset.   Instructed to call office with any fever, chills, purulent drainage, uncontrolled pain or any other questions or concerns. Patient verbalizes understanding.   Pt aware of post op appointments as well as the office number [phone] and after hours number [phone] to call if she has any questions or concerns

## 2021-04-12 NOTE — Telephone Encounter (Signed)
Attempted to reach patient, unable to contact her and unable to leave voicemail. Will try to call again.

## 2021-04-15 ENCOUNTER — Encounter: Payer: Self-pay | Admitting: Gynecologic Oncology

## 2021-04-15 ENCOUNTER — Inpatient Hospital Stay: Payer: No Typology Code available for payment source | Attending: Gynecologic Oncology | Admitting: Gynecologic Oncology

## 2021-04-15 DIAGNOSIS — Z90722 Acquired absence of ovaries, bilateral: Secondary | ICD-10-CM

## 2021-04-15 DIAGNOSIS — N8502 Endometrial intraepithelial neoplasia [EIN]: Secondary | ICD-10-CM

## 2021-04-15 DIAGNOSIS — Z90710 Acquired absence of both cervix and uterus: Secondary | ICD-10-CM

## 2021-04-15 NOTE — Progress Notes (Signed)
Gynecologic Oncology Telehealth Consult Note: Gyn-Onc  I connected with Cathy Ho on 04/15/21 at  4:20 PM EST by telephone and verified that I am speaking with the correct person using two identifiers.  I discussed the limitations, risks, security and privacy concerns of performing an evaluation and management service by telemedicine and the availability of in-person appointments. I also discussed with the patient that there may be a patient responsible charge related to this service. The patient expressed understanding and agreed to proceed.  Other persons participating in the visit and their role in the encounter: none.  Patient's location: home Provider's location: [facility]  Reason for Visit: post-op follow-up  Treatment History: 12/15: TRH/BS, SLN biopsy on right, robotic sacrocolpopexy  Interval History: Doing well. Recovery improving daily. No pain meds needs yesterday or today. Still using medication but having regular bowel function. No urinary symptoms. Denies vaginal bleeding, discharge but using estrogen cream .   Past Medical/Surgical History: Past Medical History:  Diagnosis Date   Anemia    Arthritis    Back pain    Chest pain    COVID 08/2020   Runny nose, fever, HA symptoms resolved   Diabetes mellitus without complication ([place]) 5916   type 2   Fibroids 2010   Hidradenitis suppurativa    Hypertension    Joint pain    Low iron    PCOS (polycystic ovarian syndrome)    Prolapsed uterus 2010   Sleep apnea    cpap, pt does not know settings   Vitamin D deficiency    Wears glasses     Past Surgical History:  Procedure Laterality Date   carbuckle cyst in back removed  02/2005   COLONOSCOPY N/A 08/15/2013   Procedure: COLONOSCOPY;  Surgeon: Inda Castle, MD;  Location: [facility];  Service: Endoscopy;  Laterality: N/A;   DILATION AND CURETTAGE OF UTERUS  2011   Endometrial hyperplasia with atypia, submucosal fibroid   HYSTEROSCOPY  2011    HYSTEROSCOPY WITH D & C N/A 03/03/2021   Procedure: DILATATION AND CURETTAGE /HYSTEROSCOPY;  Surgeon: Osborne Oman, MD;  Location: [facility];  Service: Gynecology;  Laterality: N/A;   ROBOTIC ASSISTED LAPAROSCOPIC HYSTERECTOMY AND SALPINGECTOMY N/A 04/08/2021   Procedure: XI ROBOTIC ASSISTED LAPAROSCOPIC HYSTERECTOMY AND SALPINGECTOMY;  Surgeon: Lafonda Mosses, MD;  Location: [facility];  Service: Gynecology;  Laterality: N/A;   ROBOTIC ASSISTED LAPAROSCOPIC SACROCOLPOPEXY N/A 04/08/2021   Procedure: XI ROBOTIC ASSISTED LAPAROSCOPIC SACROCOLPOPEXY;  Surgeon: Ardis Hughs, MD;  Location: [facility];  Service: Urology;  Laterality: N/A;   SENTINEL NODE BIOPSY N/A 04/08/2021   Procedure: SENTINEL NODE BIOPSY;  Surgeon: Lafonda Mosses, MD;  Location: [facility];  Service: Gynecology;  Laterality: N/A;   WISDOM TOOTH EXTRACTION  1998    Family History  Problem Relation Age of Onset   Hypertension Mother    Diabetes Mother    Obesity Mother    Hypertension Father    Diabetes Father    Sleep apnea Father    Obesity Father    Seizures Sister    Stroke Maternal Aunt    Diabetes Maternal Aunt    Hypertension Maternal Aunt    Diabetes Maternal Uncle    Hypertension Maternal Grandfather    Heart disease Maternal Grandfather    Diabetes Maternal Grandfather    Diabetes Paternal Grandmother    Heart disease Paternal Grandmother    Breast cancer Cousin    Colon cancer Neg Hx    Liver  disease Neg Hx    Kidney disease Neg Hx    Ovarian cancer Neg Hx    Endometrial cancer Neg Hx    Pancreatic cancer Neg Hx    Prostate cancer Neg Hx     Social History   Socioeconomic History   Marital status: Married    Spouse name: Not on file   Number of children: Not on file   Years of education: Not on file   Highest education level: Not on file  Occupational History   Occupation: Biomedical engineer and student  Tobacco Use   Smoking status: Never   Smokeless tobacco:  Never  Vaping Use   Vaping Use: Never used  Substance and Sexual Activity   Alcohol use: Yes    Comment: occasional   Drug use: No   Sexual activity: Yes    Birth control/protection: None  Other Topics Concern   Not on file  Social History Narrative   Not on file   Social Determinants of Health   Financial Resource Strain: Not on file  Food Insecurity: Not on file  Transportation Needs: Not on file  Physical Activity: Not on file  Stress: Not on file  Social Connections: Not on file    Current Medications:  Current Outpatient Medications:    alclomethasone (ACLOVATE) 0.05 % cream, Apply 1 application topically 1 (one) to 2 (two) times daily as needed for rash., Disp: 180 g, Rfl: 3   Cholecalciferol (SM VITAMIN D3) 100 MCG (4000 UT) CAPS, Take 1 capsule (4,000 Units total) by mouth daily., Disp: 30 capsule, Rfl: 0   Dapagliflozin-metFORMIN HCl ER (XIGDUO XR) 08-998 MG TB24, Take 2 tablets by mouth every morning after a meal., Disp: 180 tablet, Rfl: 1   estradiol (ESTRACE VAGINAL) 0.1 MG/GM vaginal cream, Place 1 Applicatorful vaginally 3 (three) times a week. Use 1 small amount of cream on tip of index finger and swab the inside of the vagina, Disp: 42.5 g, Rfl: 1   ibuprofen (ADVIL) 800 MG tablet, Take 1 tablet (800 mg total) by mouth every 8 (eight) hours as needed for moderate pain. For AFTER surgery only, Disp: 30 tablet, Rfl: 0   lisinopril-hydrochlorothiazide (ZESTORETIC) 10-12.5 MG tablet, Take 1 tablet by mouth daily., Disp: 90 tablet, Rfl: 3   mupirocin ointment (BACTROBAN) 2 %, Apply 1 application topically 2 (two) times daily., Disp: 22 g, Rfl: 6   oxyCODONE (OXY IR/ROXICODONE) 5 MG immediate release tablet, Take 1 tablet (5 mg total) by mouth every 4 (four) hours as needed for severe pain or breakthrough pain., Disp: 30 tablet, Rfl: 0   senna-docusate (SENOKOT-S) 8.6-50 MG tablet, Take 2 tablets by mouth at bedtime. For AFTER surgery, do not take if having diarrhea,  Disp: 30 tablet, Rfl: 0   tirzepatide (MOUNJARO) 10 MG/0.5ML Pen, Inject 10 mg into the skin once a week., Disp: 6 mL, Rfl: 0  Review of Symptoms: Pertinent positives as per HPI.  Physical Exam: There were no vitals taken for this visit. Deferred.  Laboratory & Radiologic Studies: A. UTERUS, CERVIX, BILATERAL FALLOPIAN TUBES, HYSTERECTOMY AND  SALPINGECTOMY:  - Uterus with benign inactive endometrium, see comment  - Benign unremarkable cervix  - Benign unremarkable bilateral fallopian tubes  - No evidence of malignancy   B. SENTINEL LYMPH NODE, RIGHT COMMON ILIAC, BIOPSY:  - Lymph node, negative for carcinoma (0/1)   Assessment & Plan: Cathy Ho is a 40 y.o. woman s/p robotic surgery for CAH with benign final pathology.  Doing well,  meeting milestones. Discussed continued expectations and restrictions. Reviewed pathology - she is very happy with this news.  Aware of in person follow-up.   I discussed the assessment and treatment plan with the patient. The patient was provided with an opportunity to ask questions and all were answered. The patient agreed with the plan and demonstrated an understanding of the instructions.   The patient was advised to call back or see an in-person evaluation if the symptoms worsen or if the condition fails to improve as anticipated.   16 minutes of total time was spent for this patient encounter, including preparation, over the phone counseling with the patient and coordination of care, and documentation of the encounter.   Jeral Pinch, MD  Division of Gynecologic Oncology  Department of Obstetrics and Gynecology  [facility]

## 2021-04-19 ENCOUNTER — Encounter: Payer: Self-pay | Admitting: Gynecologic Oncology

## 2021-04-20 ENCOUNTER — Other Ambulatory Visit (HOSPITAL_COMMUNITY): Payer: Self-pay

## 2021-04-20 ENCOUNTER — Other Ambulatory Visit: Payer: Self-pay | Admitting: Gynecologic Oncology

## 2021-04-20 DIAGNOSIS — B379 Candidiasis, unspecified: Secondary | ICD-10-CM

## 2021-04-20 MED ORDER — FLUCONAZOLE 150 MG PO TABS
150.0000 mg | ORAL_TABLET | Freq: Every day | ORAL | 0 refills | Status: DC
Start: 2021-04-20 — End: 2021-04-28
  Filled 2021-04-20: qty 1, 1d supply, fill #0

## 2021-04-21 ENCOUNTER — Inpatient Hospital Stay: Payer: No Typology Code available for payment source | Admitting: Gynecologic Oncology

## 2021-04-21 ENCOUNTER — Other Ambulatory Visit (HOSPITAL_COMMUNITY): Payer: Self-pay

## 2021-04-22 ENCOUNTER — Other Ambulatory Visit (HOSPITAL_COMMUNITY): Payer: Self-pay

## 2021-04-23 ENCOUNTER — Other Ambulatory Visit: Payer: No Typology Code available for payment source

## 2021-04-26 ENCOUNTER — Other Ambulatory Visit (HOSPITAL_COMMUNITY): Payer: Self-pay

## 2021-04-28 ENCOUNTER — Encounter: Payer: Self-pay | Admitting: Gynecologic Oncology

## 2021-04-29 ENCOUNTER — Ambulatory Visit (INDEPENDENT_AMBULATORY_CARE_PROVIDER_SITE_OTHER): Payer: No Typology Code available for payment source | Admitting: Physician Assistant

## 2021-04-29 ENCOUNTER — Other Ambulatory Visit (HOSPITAL_COMMUNITY): Payer: Self-pay

## 2021-04-29 ENCOUNTER — Other Ambulatory Visit: Payer: Self-pay

## 2021-04-29 DIAGNOSIS — L910 Hypertrophic scar: Secondary | ICD-10-CM

## 2021-04-29 DIAGNOSIS — L732 Hidradenitis suppurativa: Secondary | ICD-10-CM | POA: Diagnosis not present

## 2021-04-29 MED ORDER — DOXYCYCLINE HYCLATE 100 MG PO CAPS
100.0000 mg | ORAL_CAPSULE | Freq: Two times a day (BID) | ORAL | 2 refills | Status: DC
  Filled 2021-04-29: qty 60, 30d supply, fill #0

## 2021-04-29 MED ORDER — TRIAMCINOLONE ACETONIDE 0.1 % EX OINT
1.0000 "application " | TOPICAL_OINTMENT | Freq: Every day | CUTANEOUS | 6 refills | Status: DC | PRN
  Filled 2021-04-29: qty 454, 30d supply, fill #0

## 2021-04-29 NOTE — Patient Instructions (Signed)
Dr Lorenda Cahill @ [facility]

## 2021-04-30 ENCOUNTER — Encounter: Payer: Self-pay | Admitting: Gynecologic Oncology

## 2021-04-30 ENCOUNTER — Inpatient Hospital Stay: Payer: No Typology Code available for payment source | Attending: Gynecologic Oncology | Admitting: Gynecologic Oncology

## 2021-04-30 VITALS — BP 135/65 | HR 89 | Temp 97.7°F | Resp 16 | Ht 64.0 in | Wt 229.0 lb

## 2021-04-30 DIAGNOSIS — Z6841 Body Mass Index (BMI) 40.0 and over, adult: Secondary | ICD-10-CM

## 2021-04-30 DIAGNOSIS — N814 Uterovaginal prolapse, unspecified: Secondary | ICD-10-CM

## 2021-04-30 DIAGNOSIS — Z90722 Acquired absence of ovaries, bilateral: Secondary | ICD-10-CM

## 2021-04-30 DIAGNOSIS — Z90710 Acquired absence of both cervix and uterus: Secondary | ICD-10-CM

## 2021-04-30 DIAGNOSIS — N8502 Endometrial intraepithelial neoplasia [EIN]: Secondary | ICD-10-CM

## 2021-04-30 NOTE — Patient Instructions (Signed)
It was great to see you today!  You are healing well from surgery.  Remember, no heavy lifting until 6 weeks after surgery and nothing in the vagina for at least 8 weeks.  I am releasing you back to care with your gynecologist.  Please not hesitate to contact my office at [phone] if you need anything in the future.

## 2021-04-30 NOTE — Progress Notes (Signed)
Gynecologic Oncology Return Clinic Visit  04/30/2021  Reason for Visit: Follow-up after surgery  Treatment History: 12/15: Robotic TLH and bilateral salpingectomy, sentinel lymph node biopsy on the right; robotic sacrocolpopexy with Dr. Louis Meckel  Interval History: Patient presents today after surgery.  She is doing very well.  She denies any significant abdominal or pelvic pain.  She denies vaginal discharge or bleeding.  Symptoms resolved after treatment for yeast infection.  Reports regular bowel and bladder function.  Denies any fevers or chills.  Tolerating p.o. intake without nausea or emesis.  Denies any prolapse symptoms or incontinence.  Past Medical/Surgical History: Past Medical History:  Diagnosis Date   Anemia    Arthritis    Back pain    Chest pain    COVID 08/2020   Runny nose, fever, HA symptoms resolved   Diabetes mellitus without complication ([place]) 2683   type 2   Fibroids 2010   Hidradenitis suppurativa    Hypertension    Joint pain    Low iron    PCOS (polycystic ovarian syndrome)    Prolapsed uterus 2010   Sleep apnea    cpap, pt does not know settings   Vitamin D deficiency    Wears glasses     Past Surgical History:  Procedure Laterality Date   carbuckle cyst in back removed  02/2005   COLONOSCOPY N/A 08/15/2013   Procedure: COLONOSCOPY;  Surgeon: Inda Castle, MD;  Location: [facility];  Service: Endoscopy;  Laterality: N/A;   DILATION AND CURETTAGE OF UTERUS  2011   Endometrial hyperplasia with atypia, submucosal fibroid   HYSTEROSCOPY  2011   HYSTEROSCOPY WITH D & C N/A 03/03/2021   Procedure: DILATATION AND CURETTAGE /HYSTEROSCOPY;  Surgeon: Osborne Oman, MD;  Location: [place];  Service: Gynecology;  Laterality: N/A;   ROBOTIC ASSISTED LAPAROSCOPIC HYSTERECTOMY AND SALPINGECTOMY N/A 04/08/2021   Procedure: XI ROBOTIC ASSISTED LAPAROSCOPIC HYSTERECTOMY AND SALPINGECTOMY;  Surgeon: Lafonda Mosses, MD;  Location: [facility];  Service: Gynecology;  Laterality: N/A;   ROBOTIC ASSISTED LAPAROSCOPIC SACROCOLPOPEXY N/A 04/08/2021   Procedure: XI ROBOTIC ASSISTED LAPAROSCOPIC SACROCOLPOPEXY;  Surgeon: Ardis Hughs, MD;  Location: [facility];  Service: Urology;  Laterality: N/A;   SENTINEL NODE BIOPSY N/A 04/08/2021   Procedure: SENTINEL NODE BIOPSY;  Surgeon: Lafonda Mosses, MD;  Location: [facility];  Service: Gynecology;  Laterality: N/A;   WISDOM TOOTH EXTRACTION  1998    Family History  Problem Relation Age of Onset   Hypertension Mother    Diabetes Mother    Obesity Mother    Hypertension Father    Diabetes Father    Sleep apnea Father    Obesity Father    Seizures Sister    Stroke Maternal Aunt    Diabetes Maternal Aunt    Hypertension Maternal Aunt    Diabetes Maternal Uncle    Hypertension Maternal Grandfather    Heart disease Maternal Grandfather    Diabetes Maternal Grandfather    Diabetes Paternal Grandmother    Heart disease Paternal Grandmother    Breast cancer Cousin    Colon cancer Neg Hx    Liver disease Neg Hx    Kidney disease Neg Hx    Ovarian cancer Neg Hx    Endometrial cancer Neg Hx    Pancreatic cancer Neg Hx    Prostate cancer Neg Hx     Social History   Socioeconomic History   Marital status: Significant Other    Spouse name:  Not on file   Number of children: Not on file   Years of education: Not on file   Highest education level: Not on file  Occupational History   Occupation: Biomedical engineer and student  Tobacco Use   Smoking status: Never   Smokeless tobacco: Never  Vaping Use   Vaping Use: Never used  Substance and Sexual Activity   Alcohol use: Yes    Comment: occasional   Drug use: No   Sexual activity: Yes    Birth control/protection: None  Other Topics Concern   Not on file  Social History Narrative   Not on file   Social Determinants of Health   Financial Resource Strain: Not on file  Food Insecurity: Not on file  Transportation  Needs: Not on file  Physical Activity: Not on file  Stress: Not on file  Social Connections: Not on file    Current Medications:  Current Outpatient Medications:    alclomethasone (ACLOVATE) 0.05 % cream, Apply 1 application topically 1 (one) to 2 (two) times daily as needed for rash., Disp: 180 g, Rfl: 3   Cholecalciferol (SM VITAMIN D3) 100 MCG (4000 UT) CAPS, Take 1 capsule (4,000 Units total) by mouth daily., Disp: 30 capsule, Rfl: 0   Dapagliflozin-metFORMIN HCl ER (XIGDUO XR) 08-998 MG TB24, Take 2 tablets by mouth every morning after a meal., Disp: 180 tablet, Rfl: 1   estradiol (ESTRACE VAGINAL) 0.1 MG/GM vaginal cream, Place 1 Applicatorful vaginally 3 (three) times a week. Use 1 small amount of cream on tip of index finger and swab the inside of the vagina, Disp: 42.5 g, Rfl: 1   lisinopril-hydrochlorothiazide (ZESTORETIC) 10-12.5 MG tablet, Take 1 tablet by mouth daily., Disp: 90 tablet, Rfl: 3   mupirocin ointment (BACTROBAN) 2 %, Apply 1 application topically 2 (two) times daily., Disp: 22 g, Rfl: 6   senna-docusate (SENOKOT-S) 8.6-50 MG tablet, Take 2 tablets by mouth at bedtime. For AFTER surgery, do not take if having diarrhea, Disp: 30 tablet, Rfl: 0   tirzepatide (MOUNJARO) 10 MG/0.5ML Pen, Inject 10 mg into the skin once a week., Disp: 6 mL, Rfl: 0   doxycycline (VIBRAMYCIN) 100 MG capsule, Take 1 capsule (100 mg total) by mouth 2 (two) times daily., Disp: 60 capsule, Rfl: 2   ibuprofen (ADVIL) 800 MG tablet, Take 1 tablet (800 mg total) by mouth every 8 (eight) hours as needed for moderate pain. For AFTER surgery only (Patient not taking: Reported on 04/28/2021), Disp: 30 tablet, Rfl: 0   oxyCODONE (OXY IR/ROXICODONE) 5 MG immediate release tablet, Take 1 tablet (5 mg total) by mouth every 4 (four) hours as needed for severe pain or breakthrough pain. (Patient not taking: Reported on 04/28/2021), Disp: 30 tablet, Rfl: 0   triamcinolone ointment (KENALOG) 0.1 %, Apply 1 application  topically daily as needed., Disp: 454 g, Rfl: 6  Review of Systems: Denies appetite changes, fevers, chills, fatigue, unexplained weight changes. Denies hearing loss, neck lumps or masses, mouth sores, ringing in ears or voice changes. Denies cough or wheezing.  Denies shortness of breath. Denies chest pain or palpitations. Denies leg swelling. Denies abdominal distention, pain, blood in stools, constipation, diarrhea, nausea, vomiting, or early satiety. Denies pain with intercourse, dysuria, frequency, hematuria or incontinence. Denies hot flashes, pelvic pain, vaginal bleeding or vaginal discharge.   Denies joint pain, back pain or muscle pain/cramps. Denies itching, rash, or wounds. Denies dizziness, headaches, numbness or seizures. Denies swollen lymph nodes or glands, denies easy bruising or  bleeding. Denies anxiety, depression, confusion, or decreased concentration.  Physical Exam: BP 135/65 (BP Location: Left Arm, Patient Position: Sitting)    Pulse 89    Temp 97.7 F (36.5 C) (Tympanic)    Resp 16    Ht 5\' 4"  (1.626 m)    Wt 229 lb (103.9 kg)    SpO2 100%    BMI 39.31 kg/m  General: Alert, oriented, no acute distress. HEENT: Normocephalic, atraumatic, sclera anicteric. Chest: Unlabored breathing on room air. Abdomen: Obese, soft, nontender.  Normoactive bowel sounds.  No masses or hepatosplenomegaly appreciated.  Well-healed incisions, remaining Dermabond removed. Extremities: Grossly normal range of motion.  Warm, well perfused.  No edema bilaterally. GU: Normal appearing external genitalia without erythema, excoriation, or lesions.  Speculum exam reveals minimal physiologic discharge, cuff intact, suture visible.  Bimanual exam reveals cuff intact, no fluctuance or tenderness.   Laboratory & Radiologic Studies: Pathology 12/15: A. UTERUS, CERVIX, BILATERAL FALLOPIAN TUBES, HYSTERECTOMY AND  SALPINGECTOMY:  - Uterus with benign inactive endometrium, see comment  - Benign  unremarkable cervix  - Benign unremarkable bilateral fallopian tubes  - No evidence of malignancy   B. 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  Patient is overall doing well and meeting postoperative milestones.  Discussed continued activity restrictions and postoperative expectations.  Patient was given a copy of her pathology report today.  We discussed findings on hysterectomy specimen.  No residual hyperplasia seen.  Patient has follow-up with Dr. Louis Meckel again next week.  She is doing very well from a prolapse standpoint.  Reviewed discharge back to her gynecologist for future women's health needs.  28 minutes of total time was spent for this patient encounter, including preparation, face-to-face counseling with the patient and coordination of care, and documentation of the encounter.  Jeral Pinch, MD  Division of Gynecologic Oncology  Department of Obstetrics and Gynecology  [facility]

## 2021-05-03 ENCOUNTER — Encounter: Payer: Self-pay | Admitting: Physician Assistant

## 2021-05-03 NOTE — Progress Notes (Signed)
° °  Follow-Up Visit   Subjective  Cathy Ho is a 41 y.o. female who presents for the following: Follow-up (Here to follow up on neck keloid. She says it is better. Treatment is alclometasone cream and we injected with triamcinolone lest visit. Wants [place] to look at armpits she has HS and used the mupirocin cream on the open lesions and she wants [place] to check and see if its ok.) She has had this condition for years and it has gotten worse as she ages. She had an active cyst that has calmed down now. She would like to discuss treatment as it is very painful and affects her lifestyle.  She has type 2 diabetes and has lost 80 pounds over the last few years.    The following portions of the chart were reviewed this encounter and updated as appropriate:  Tobacco   Allergies   Meds   Problems   Med Hx   Surg Hx   Fam Hx       Objective  Well appearing patient in no apparent distress; mood and affect are within normal limits.  All skin waist up examined.  Neck - Anterior Marked improvement- slight elevation.  Left Axilla, Right Axilla Cysts with scarring and tunneling. Small amount of  yellow exudate and erythema.    Assessment & Plan  Keloid Neck - Anterior  triamcinolone ointment (KENALOG) 0.1 % - Neck - Anterior Apply 1 application topically daily as needed.  Related Medications alclomethasone (ACLOVATE) 0.05 % cream Apply 1 application topically 1 (one) to 2 (two) times daily as needed for rash.  mupirocin ointment (BACTROBAN) 2 % Apply 1 application topically 2 (two) times daily.  Hidradenitis suppurativa Left Axilla; Right Axilla  Reading material given for Humira new start    doxycycline (VIBRAMYCIN) 100 MG capsule - Left Axilla, Right Axilla Take 1 capsule (100 mg total) by mouth 2 (two) times daily.    I, Gordy Goar, PA-C, have reviewed all documentation's for this visit.  The documentation on 05/03/21 for the exam, diagnosis, procedures and orders are all  accurate and complete.

## 2021-05-04 ENCOUNTER — Ambulatory Visit (INDEPENDENT_AMBULATORY_CARE_PROVIDER_SITE_OTHER): Payer: No Typology Code available for payment source | Admitting: Family Medicine

## 2021-05-10 ENCOUNTER — Encounter (INDEPENDENT_AMBULATORY_CARE_PROVIDER_SITE_OTHER): Payer: Self-pay

## 2021-05-28 ENCOUNTER — Other Ambulatory Visit (HOSPITAL_COMMUNITY): Payer: Self-pay

## 2021-06-08 LAB — COMPREHENSIVE METABOLIC PANEL
Albumin: 3.2 — AB (ref 3.5–5.0)
Calcium: 9.4 (ref 8.7–10.7)
Globulin: 4.5
eGFR: 115

## 2021-06-08 LAB — LIPID PANEL
Cholesterol: 140 (ref 0–200)
HDL: 34 — AB (ref 35–70)
LDL Cholesterol: 91
LDl/HDL Ratio: 2.7
Triglycerides: 75 (ref 40–160)

## 2021-06-08 LAB — BASIC METABOLIC PANEL
BUN: 17 (ref 4–21)
CO2: 28 — AB (ref 13–22)
Chloride: 101 (ref 99–108)
Creatinine: 0.7 (ref 0.5–1.1)
Glucose: 100
Potassium: 4.2 mEq/L (ref 3.5–5.1)
Sodium: 137 (ref 137–147)

## 2021-06-08 LAB — HEPATIC FUNCTION PANEL
ALT: 25 U/L (ref 7–35)
AST: 16 (ref 13–35)
Alkaline Phosphatase: 99 (ref 25–125)
Bilirubin, Total: 0.3

## 2021-06-08 LAB — CBC AND DIFFERENTIAL
HCT: 33 — AB (ref 36–46)
Hemoglobin: 10.6 — AB (ref 12.0–16.0)
Neutrophils Absolute: 5.9
Platelets: 496 10*3/uL — AB (ref 150–400)

## 2021-06-08 LAB — VITAMIN D 25 HYDROXY (VIT D DEFICIENCY, FRACTURES): Vit D, 25-Hydroxy: 22.8

## 2021-06-08 LAB — HEMOGLOBIN A1C: Hemoglobin A1C: 5.9

## 2021-06-10 ENCOUNTER — Ambulatory Visit
Admission: RE | Admit: 2021-06-10 | Discharge: 2021-06-10 | Disposition: A | Discharge status: Home / Self Care | Payer: No Typology Code available for payment source | Source: Ambulatory Visit | Attending: Family Medicine | Admitting: Family Medicine

## 2021-06-10 DIAGNOSIS — R928 Other abnormal and inconclusive findings on diagnostic imaging of breast: Secondary | ICD-10-CM

## 2021-06-15 ENCOUNTER — Other Ambulatory Visit (HOSPITAL_COMMUNITY): Payer: Self-pay

## 2021-06-24 ENCOUNTER — Other Ambulatory Visit (HOSPITAL_COMMUNITY): Payer: Self-pay

## 2021-06-24 MED ORDER — XIGDUO XR 5-1000 MG PO TB24
1.0000 | ORAL_TABLET | Freq: Every day | ORAL | 1 refills | Status: DC
  Filled 2021-06-24: qty 90, 90d supply, fill #0
  Filled 2021-11-16: qty 90, 90d supply, fill #1

## 2021-06-28 ENCOUNTER — Other Ambulatory Visit: Payer: Self-pay

## 2021-06-28 ENCOUNTER — Ambulatory Visit (INDEPENDENT_AMBULATORY_CARE_PROVIDER_SITE_OTHER): Payer: No Typology Code available for payment source | Admitting: Bariatrics

## 2021-06-28 ENCOUNTER — Encounter (INDEPENDENT_AMBULATORY_CARE_PROVIDER_SITE_OTHER): Payer: Self-pay | Admitting: Bariatrics

## 2021-06-28 ENCOUNTER — Other Ambulatory Visit (HOSPITAL_COMMUNITY): Payer: Self-pay

## 2021-06-28 VITALS — BP 117/83 | HR 73 | Temp 98.4°F | Ht 64.0 in | Wt 230.0 lb

## 2021-06-28 DIAGNOSIS — Z7985 Long-term (current) use of injectable non-insulin antidiabetic drugs: Secondary | ICD-10-CM

## 2021-06-28 DIAGNOSIS — Z6839 Body mass index (BMI) 39.0-39.9, adult: Secondary | ICD-10-CM

## 2021-06-28 DIAGNOSIS — I152 Hypertension secondary to endocrine disorders: Secondary | ICD-10-CM

## 2021-06-28 DIAGNOSIS — E669 Obesity, unspecified: Secondary | ICD-10-CM

## 2021-06-28 DIAGNOSIS — E1159 Type 2 diabetes mellitus with other circulatory complications: Secondary | ICD-10-CM

## 2021-06-28 DIAGNOSIS — R809 Proteinuria, unspecified: Secondary | ICD-10-CM

## 2021-06-28 DIAGNOSIS — E1129 Type 2 diabetes mellitus with other diabetic kidney complication: Secondary | ICD-10-CM

## 2021-06-28 DIAGNOSIS — Z6841 Body Mass Index (BMI) 40.0 and over, adult: Secondary | ICD-10-CM

## 2021-06-28 MED ORDER — TIRZEPATIDE 12.5 MG/0.5ML ~~LOC~~ SOAJ
12.5000 mg | SUBCUTANEOUS | 0 refills | Status: DC
  Filled 2021-06-28: qty 2, 28d supply, fill #0

## 2021-06-28 NOTE — Progress Notes (Signed)
? ? ? ?Chief Complaint:  ? ?OBESITY ?Cathy Ho is here to discuss her progress with her obesity treatment plan along with follow-up of her obesity related diagnoses. Cathy Ho is on the Category 4 Plan and states she is following her eating plan approximately 0% of the time. Cathy Ho states she is doing 0 minutes 0 times per week. ? ?Today's visit was #: 55 ?Starting weight: 284 lbs ?Starting date: 10/14/2019 ?Today's weight: 230 lbs ?Today's date: 06/28/2021 ?Total lbs lost to date: 54 lbs ?Total lbs lost since last in-office visit: 4 lbs ? ?Interim History: Cathy Ho is down an additional 4 lbs and doing well overall. She states that she needs to get back on track.  ? ?Subjective:  ? ?1. Type 2 diabetes mellitus with microalbuminuria, without long-term current use of insulin ([place]) ?Cathy Ho is currently taking Mounjaro 12.5 mg. ? ?2. Hypertension associated with type 2 diabetes mellitus ([place]) ?Cathy Ho is taking Zestoretic. Her blood pressure is controlled. Her last blood pressure was 118/74. ? ?Assessment/Plan:  ? ?1. Type 2 diabetes mellitus with microalbuminuria, without long-term current use of insulin ([place]) ?We will refill Mounjaro 12.5 mg for 1 month with no refills. Good blood sugar control is important to decrease the likelihood of diabetic complications such as nephropathy, neuropathy, limb loss, blindness, coronary artery disease, and death. Intensive lifestyle modification including diet, exercise and weight loss are the first line of treatment for diabetes.  ? ?- tirzepatide (MOUNJARO) 12.5 MG/0.5ML Pen; Inject 12.5 mg into the skin once a week.  Dispense: 2 mL; Refill: 0 ? ?2. Hypertension associated with type 2 diabetes mellitus ([place]) ?Cathy Ho is working on healthy weight loss and exercise to improve blood pressure control. We will watch for signs of hypotension as she continues her lifestyle modifications. ? ?3. Obesity with current BMI of 39.5 ?Cathy Ho will continue taking her medications. We discussed being  on a kidney-protection medication. She is currently in the action stage of change. As such, her goal is to continue with weight loss efforts. She has agreed to the Category 4 Plan.  ? ?Cathy Ho will continue meal planning and she will continue intentional eating. She will increase water and protein intake.  ? ?Exercise goals:  Cathy Ho will continue to exercise. ? ?Behavioral modification strategies: increasing lean protein intake, decreasing simple carbohydrates, increasing vegetables, increasing water intake, decreasing eating out, no skipping meals, meal planning and cooking strategies, keeping healthy foods in the home, and planning for success. ? ?Cathy Ho has agreed to follow-up with our clinic in 3-4 weeks with Abby Potash, PA-C or Jake Bathe, [place]. She was informed of the importance of frequent follow-up visits to maximize her success with intensive lifestyle modifications for her multiple health conditions.  ? ?Objective:  ? ?Blood pressure 117/83, pulse 73, temperature 98.4 ?F (36.9 ?C), height '5\' 4"'$  (1.626 m), weight 230 lb (104.3 kg), SpO2 100 %. ?Body mass index is 39.48 kg/m?. ? ?General: Cooperative, alert, well developed, in no acute distress. ?HEENT: Conjunctivae and lids unremarkable. ?Cardiovascular: Regular rhythm.  ?Lungs: Normal work of breathing. ?Neurologic: No focal deficits.  ? ?Lab Results  ?Component Value Date  ? CREATININE 0.73 04/08/2021  ? BUN 12 04/08/2021  ? NA 135 04/08/2021  ? K 3.7 04/08/2021  ? CL 106 04/08/2021  ? CO2 18 (L) 04/08/2021  ? ?Lab Results  ?Component Value Date  ? ALT 20 03/25/2021  ? AST 17 03/25/2021  ? ALKPHOS 99 03/25/2021  ? BILITOT 0.7 03/25/2021  ? ?Lab Results  ?Component Value Date  ?  HGBA1C 5.8 (H) 03/25/2021  ? HGBA1C 5.6 12/04/2020  ? HGBA1C 5.9 03/11/2020  ? ?No results found for: INSULIN ?No results found for: TSH ?Lab Results  ?Component Value Date  ? CHOL 119 03/11/2020  ? HDL 29 (A) 03/11/2020  ? [place] 3 03/11/2020  ? ?Lab Results  ?Component  Value Date  ? VD25OH 62.5 03/11/2020  ? ?Lab Results  ?Component Value Date  ? WBC 17.3 (H) 04/08/2021  ? HGB 10.1 (L) 04/08/2021  ? HCT 31.5 (L) 04/08/2021  ? MCV 77.6 (L) 04/08/2021  ? PLT 464 (H) 04/08/2021  ? ?No results found for: IRON, TIBC, FERRITIN ? ?Attestation Statements:  ? ?Reviewed by clinician on day of visit: allergies, medications, problem list, medical history, surgical history, family history, social history, and previous encounter notes. ? ?I, Lizbeth Bark, RMA, am acting as transcriptionist for [employer], DO. ? ?I have reviewed the above documentation for accuracy and completeness, and I agree with the above. Jearld Lesch, DO ? ?

## 2021-06-29 ENCOUNTER — Other Ambulatory Visit (HOSPITAL_COMMUNITY): Payer: Self-pay

## 2021-06-29 ENCOUNTER — Other Ambulatory Visit (INDEPENDENT_AMBULATORY_CARE_PROVIDER_SITE_OTHER): Payer: Self-pay | Admitting: Bariatrics

## 2021-06-29 DIAGNOSIS — E1129 Type 2 diabetes mellitus with other diabetic kidney complication: Secondary | ICD-10-CM

## 2021-06-29 MED ORDER — TIRZEPATIDE 12.5 MG/0.5ML ~~LOC~~ SOAJ
12.5000 mg | SUBCUTANEOUS | 0 refills | Status: DC
  Filled 2021-06-29 – 2021-06-30 (×2): qty 6, 84d supply, fill #0

## 2021-06-29 NOTE — Telephone Encounter (Signed)
Please review

## 2021-06-30 ENCOUNTER — Other Ambulatory Visit (HOSPITAL_COMMUNITY): Payer: Self-pay

## 2021-07-01 ENCOUNTER — Other Ambulatory Visit (INDEPENDENT_AMBULATORY_CARE_PROVIDER_SITE_OTHER): Payer: Self-pay | Admitting: Bariatrics

## 2021-07-01 ENCOUNTER — Telehealth (INDEPENDENT_AMBULATORY_CARE_PROVIDER_SITE_OTHER): Payer: Self-pay

## 2021-07-01 ENCOUNTER — Other Ambulatory Visit (INDEPENDENT_AMBULATORY_CARE_PROVIDER_SITE_OTHER): Payer: Self-pay

## 2021-07-01 ENCOUNTER — Other Ambulatory Visit (HOSPITAL_COMMUNITY): Payer: Self-pay

## 2021-07-01 ENCOUNTER — Encounter (INDEPENDENT_AMBULATORY_CARE_PROVIDER_SITE_OTHER): Payer: Self-pay | Admitting: Bariatrics

## 2021-07-01 DIAGNOSIS — E1129 Type 2 diabetes mellitus with other diabetic kidney complication: Secondary | ICD-10-CM

## 2021-07-01 MED ORDER — TIRZEPATIDE 10 MG/0.5ML ~~LOC~~ SOAJ
10.0000 mg | SUBCUTANEOUS | 0 refills | Status: DC
  Filled 2021-07-01: qty 2, 28d supply, fill #0

## 2021-07-01 MED ORDER — TIRZEPATIDE 10 MG/0.5ML ~~LOC~~ SOAJ
10.0000 mg | SUBCUTANEOUS | 0 refills | Status: DC
  Filled 2021-07-01: qty 6, 84d supply, fill #0

## 2021-07-01 NOTE — Telephone Encounter (Signed)
Please review

## 2021-07-01 NOTE — Telephone Encounter (Signed)
Pt would like to know if she can have an RX for Mounjaro '10mg'$  because all [facility] pharmacies are out of 12.'5mg'$ .  ?

## 2021-07-22 ENCOUNTER — Other Ambulatory Visit (INDEPENDENT_AMBULATORY_CARE_PROVIDER_SITE_OTHER): Payer: Self-pay

## 2021-07-26 ENCOUNTER — Ambulatory Visit (INDEPENDENT_AMBULATORY_CARE_PROVIDER_SITE_OTHER): Payer: No Typology Code available for payment source | Admitting: Bariatrics

## 2021-07-26 ENCOUNTER — Encounter (INDEPENDENT_AMBULATORY_CARE_PROVIDER_SITE_OTHER): Payer: Self-pay | Admitting: Bariatrics

## 2021-07-26 VITALS — BP 120/78 | HR 78 | Temp 98.4°F | Ht 64.0 in | Wt 232.0 lb

## 2021-07-26 DIAGNOSIS — E1159 Type 2 diabetes mellitus with other circulatory complications: Secondary | ICD-10-CM

## 2021-07-26 DIAGNOSIS — Z6839 Body mass index (BMI) 39.0-39.9, adult: Secondary | ICD-10-CM

## 2021-07-26 DIAGNOSIS — E669 Obesity, unspecified: Secondary | ICD-10-CM

## 2021-07-26 DIAGNOSIS — E1169 Type 2 diabetes mellitus with other specified complication: Secondary | ICD-10-CM | POA: Diagnosis not present

## 2021-07-26 DIAGNOSIS — I152 Hypertension secondary to endocrine disorders: Secondary | ICD-10-CM | POA: Diagnosis not present

## 2021-07-26 NOTE — Progress Notes (Signed)
? ? ? ?Chief Complaint:  ? ?OBESITY ?Loretto is here to discuss her progress with her obesity treatment plan along with follow-up of her obesity related diagnoses. Brocha is on the Category 4 Plan and states she is following her eating plan approximately 50% of the time. Dannae states she is doing 0 minutes 0 times per week. ? ?Today's visit was #: 23 ?Starting weight: 284 lbs ?Starting date: 10/14/2019 ?Today's weight: 232 lbs ?Today's date: 07/26/2021 ?Total lbs lost to date: 52 lbs ?Total lbs lost since last in-office visit: 0 ? ?Interim History: Giannina is up 2 lbs but has done well overall. She is getting back into the swing. She is doing better with her water.  ? ?Subjective:  ? ?1. Hypertension associated with type 2 diabetes mellitus ([place]) ?Alin's blood pressure is controlled. Her last blood pressure was 117/83. ? ?2. Type 2 diabetes mellitus with other specified complication, without long-term current use of insulin ([place]) ?Romy is taking [place] and Xigduo. Aloria states medications help with appetite control.  ? ?Assessment/Plan:  ? ?1. Hypertension associated with type 2 diabetes mellitus ([place]) ?[place] will continue medications. She is working on healthy weight loss and exercise to improve blood pressure control. We will watch for signs of hypotension as she continues her lifestyle modifications. ? ?2. Type 2 diabetes mellitus with other specified complication, without long-term current use of insulin ([place]) ?Koa will continue her medications. Good blood sugar control is important to decrease the likelihood of diabetic complications such as nephropathy, neuropathy, limb loss, blindness, coronary artery disease, and death. Intensive lifestyle modification including diet, exercise and weight loss are the first line of treatment for diabetes.  ? ?3. Obesity with current BMI of 39.9 ?Shemika is currently in the action stage of change. As such, her goal is to continue with weight loss efforts. She  has agreed to the Category 4 Plan.  ? ?Salisa will continue meal planning and she will continue intentional eating. She will not skip meals.  ? ?Exercise goals:  Rhyse will begin zumba classes tomorrow.  ? ?Behavioral modification strategies: increasing lean protein intake, decreasing simple carbohydrates, increasing vegetables, increasing water intake, decreasing eating out, no skipping meals, meal planning and cooking strategies, keeping healthy foods in the home, and planning for success. ? ?Kaleya has agreed to follow-up with our clinic in 4-5 weeks. She was informed of the importance of frequent follow-up visits to maximize her success with intensive lifestyle modifications for her multiple health conditions.  ? ?Objective:  ? ?Blood pressure 120/78, pulse 78, temperature 98.4 ?F (36.9 ?C), height '5\' 4"'$  (1.626 m), weight 232 lb (105.2 kg), SpO2 96 %. ?Body mass index is 39.82 kg/m?. ? ?General: Cooperative, alert, well developed, in no acute distress. ?HEENT: Conjunctivae and lids unremarkable. ?Cardiovascular: Regular rhythm.  ?Lungs: Normal work of breathing. ?Neurologic: No focal deficits.  ? ?Lab Results  ?Component Value Date  ? CREATININE 0.7 06/08/2021  ? BUN 17 06/08/2021  ? NA 137 06/08/2021  ? K 4.2 06/08/2021  ? CL 101 06/08/2021  ? CO2 28 (A) 06/08/2021  ? ?Lab Results  ?Component Value Date  ? ALT 25 06/08/2021  ? AST 16 06/08/2021  ? ALKPHOS 99 06/08/2021  ? BILITOT 0.7 03/25/2021  ? ?Lab Results  ?Component Value Date  ? HGBA1C 5.9 06/08/2021  ? HGBA1C 5.8 (H) 03/25/2021  ? HGBA1C 5.6 12/04/2020  ? HGBA1C 5.9 03/11/2020  ? ?No results found for: INSULIN ?No results found for: TSH ?Lab Results  ?Component Value  Date  ? CHOL 140 06/08/2021  ? HDL 34 (A) 06/08/2021  ? [place] 91 06/08/2021  ? TRIG 75 06/08/2021  ? ?Lab Results  ?Component Value Date  ? VD25OH 22.8 06/08/2021  ? VD25OH 62.5 03/11/2020  ? ?Lab Results  ?Component Value Date  ? WBC 17.3 (H) 04/08/2021  ? HGB 10.6 (A) 06/08/2021  ?  HCT 33 (A) 06/08/2021  ? MCV 77.6 (L) 04/08/2021  ? PLT 496 (A) 06/08/2021  ? ?No results found for: IRON, TIBC, FERRITIN ? ?Attestation Statements:  ? ?Reviewed by clinician on day of visit: allergies, medications, problem list, medical history, surgical history, family history, social history, and previous encounter notes. ? ?I, Lizbeth Bark, RMA, am acting as transcriptionist for [employer], DO. ? ?I have reviewed the above documentation for accuracy and completeness, and I agree with the above. Jearld Lesch, DO ? ?

## 2021-07-27 ENCOUNTER — Encounter (INDEPENDENT_AMBULATORY_CARE_PROVIDER_SITE_OTHER): Payer: Self-pay | Admitting: Bariatrics

## 2021-08-16 ENCOUNTER — Other Ambulatory Visit (HOSPITAL_COMMUNITY): Payer: Self-pay

## 2021-08-16 MED ORDER — ESTRADIOL 0.1 MG/GM VA CREA
1.0000 | TOPICAL_CREAM | VAGINAL | 3 refills | Status: AC
  Filled 2021-08-16: qty 42.5, 90d supply, fill #0
  Filled 2021-10-25: qty 42.5, 90d supply, fill #1
  Filled 2022-03-14: qty 42.5, 90d supply, fill #2
  Filled 2022-06-13: qty 42.5, 90d supply, fill #3

## 2021-08-23 ENCOUNTER — Other Ambulatory Visit (HOSPITAL_COMMUNITY): Payer: Self-pay

## 2021-08-23 MED ORDER — ERGOCALCIFEROL 1.25 MG (50000 UT) PO CAPS
1.0000 | ORAL_CAPSULE | ORAL | 3 refills | Status: DC
  Filled 2021-08-23: qty 12, 84d supply, fill #0
  Filled 2021-11-16: qty 12, 84d supply, fill #1
  Filled 2022-02-25: qty 12, 84d supply, fill #2
  Filled 2022-05-27: qty 12, 84d supply, fill #3

## 2021-08-23 MED ORDER — FREESTYLE LITE TEST VI STRP
ORAL_STRIP | 98 refills | Status: AC
  Filled 2021-08-23: qty 50, 50d supply, fill #0
  Filled 2022-05-14 – 2022-05-17 (×2): qty 50, 50d supply, fill #1

## 2021-08-24 ENCOUNTER — Ambulatory Visit (INDEPENDENT_AMBULATORY_CARE_PROVIDER_SITE_OTHER): Payer: No Typology Code available for payment source | Admitting: Bariatrics

## 2021-08-24 ENCOUNTER — Encounter (INDEPENDENT_AMBULATORY_CARE_PROVIDER_SITE_OTHER): Payer: Self-pay | Admitting: Bariatrics

## 2021-08-24 ENCOUNTER — Other Ambulatory Visit (HOSPITAL_COMMUNITY): Payer: Self-pay

## 2021-08-24 VITALS — BP 113/76 | HR 79 | Temp 97.8°F | Ht 64.0 in | Wt 230.0 lb

## 2021-08-24 DIAGNOSIS — E1169 Type 2 diabetes mellitus with other specified complication: Secondary | ICD-10-CM | POA: Diagnosis not present

## 2021-08-24 DIAGNOSIS — E785 Hyperlipidemia, unspecified: Secondary | ICD-10-CM

## 2021-08-24 DIAGNOSIS — E1129 Type 2 diabetes mellitus with other diabetic kidney complication: Secondary | ICD-10-CM | POA: Diagnosis not present

## 2021-08-24 DIAGNOSIS — R809 Proteinuria, unspecified: Secondary | ICD-10-CM | POA: Diagnosis not present

## 2021-08-24 DIAGNOSIS — Z6839 Body mass index (BMI) 39.0-39.9, adult: Secondary | ICD-10-CM

## 2021-08-24 DIAGNOSIS — Z7985 Long-term (current) use of injectable non-insulin antidiabetic drugs: Secondary | ICD-10-CM

## 2021-08-24 DIAGNOSIS — E669 Obesity, unspecified: Secondary | ICD-10-CM

## 2021-08-24 MED ORDER — TIRZEPATIDE 10 MG/0.5ML ~~LOC~~ SOAJ
10.0000 mg | SUBCUTANEOUS | 0 refills | Status: DC
  Filled 2021-08-24 – 2021-09-20 (×2): qty 2, 28d supply, fill #0

## 2021-08-31 ENCOUNTER — Encounter (INDEPENDENT_AMBULATORY_CARE_PROVIDER_SITE_OTHER): Payer: Self-pay | Admitting: Bariatrics

## 2021-08-31 NOTE — Progress Notes (Signed)
? ? ? ?Chief Complaint:  ? ?OBESITY ?Cathy Ho is here to discuss her progress with her obesity treatment plan along with follow-up of her obesity related diagnoses. Liliah is on the Category 4 Plan and states she is following her eating plan approximately 60% of the time. Laiana states she is doing zumba for 45 minutes 1 times per week. ? ?Today's visit was #: 24 ?Starting weight: 284 lbs ?Starting date: 10/14/2019 ?Today's weight: 230 lbs ?Today's date: 08/24/2021 ?Total lbs lost to date: 54 lbs ?Total lbs lost since last in-office visit: 2 lbs ? ?Interim History: Lashae is down 2 lbs since her last visit. She is working a lot. She still struggles with water.  ? ?Subjective:  ? ?1. Type 2 diabetes mellitus with microalbuminuria, without long-term current use of insulin ([place]) ?Atha is taking Mounjaro currently. She denies side effects.  ? ?2. Hyperlipidemia associated with type 2 diabetes mellitus ([place]) ?Jeslyn is not on medications currently. Her cholesterol reasonably well controlled.  ? ?Assessment/Plan:  ? ?1. Type 2 diabetes mellitus with microalbuminuria, without long-term current use of insulin ([place]) ?We will refill Mounjaro 10 mg for 1 month with no refills. Good blood sugar control is important to decrease the likelihood of diabetic complications such as nephropathy, neuropathy, limb loss, blindness, coronary artery disease, and death. Intensive lifestyle modification including diet, exercise and weight loss are the first line of treatment for diabetes.  ? ?- tirzepatide (MOUNJARO) 10 MG/0.5ML Pen; Inject 10 mg into the skin once a week.  Dispense: 2 mL; Refill: 0 ? ?2. Hyperlipidemia associated with type 2 diabetes mellitus ([place]) ?Cardiovascular risk and specific lipid/LDL goals reviewed.  Patriciann will continue plan and exercise. We discussed several lifestyle modifications today and Marykatherine will continue to work on diet, exercise and weight loss efforts. Orders and follow up as documented in patient  record.  ? ?Counseling ?Intensive lifestyle modifications are the first line treatment for this issue. ?Dietary changes: Increase soluble fiber. Decrease simple carbohydrates. ?Exercise changes: Moderate to vigorous-intensity aerobic activity 150 minutes per week if tolerated. ?Lipid-lowering medications: see documented in medical record. ? ?3. Obesity, Current BMI 39.5 ?Ashia is currently in the action stage of change. As such, her goal is to . She has agreed to the Category 4 Plan.  ? ?Bellanie will continue meal planning  and she will continue intentional eating. We reviewed labs form 08/16/2021 Lipids, Vitamin D, A1C, CBC, and CMP.  ? ?Exercise goals:  As is.  ? ?Behavioral modification strategies: increasing lean protein intake, decreasing simple carbohydrates, increasing vegetables, increasing water intake, decreasing eating out, no skipping meals, meal planning and cooking strategies, keeping healthy foods in the home, and planning for success. ? ?Elliemae has agreed to follow-up with our clinic in 4 weeks with nurse practitioner and 8 weeks with myself. She was informed of the importance of frequent follow-up visits to maximize her success with intensive lifestyle modifications for her multiple health conditions.  ? ?Objective:  ? ?Blood pressure 113/76, pulse 79, temperature 97.8 ?F (36.6 ?C), height '5\' 4"'$  (1.626 m), weight 230 lb (104.3 kg), SpO2 98 %. ?Body mass index is 39.48 kg/m?. ? ?General: Cooperative, alert, well developed, in no acute distress. ?HEENT: Conjunctivae and lids unremarkable. ?Cardiovascular: Regular rhythm.  ?Lungs: Normal work of breathing. ?Neurologic: No focal deficits.  ? ?Lab Results  ?Component Value Date  ? CREATININE 0.7 06/08/2021  ? BUN 17 06/08/2021  ? NA 137 06/08/2021  ? K 4.2 06/08/2021  ? CL 101 06/08/2021  ?  CO2 28 (A) 06/08/2021  ? ?Lab Results  ?Component Value Date  ? ALT 25 06/08/2021  ? AST 16 06/08/2021  ? ALKPHOS 99 06/08/2021  ? BILITOT 0.7 03/25/2021  ? ?Lab  Results  ?Component Value Date  ? HGBA1C 5.9 06/08/2021  ? HGBA1C 5.8 (H) 03/25/2021  ? HGBA1C 5.6 12/04/2020  ? HGBA1C 5.9 03/11/2020  ? ?No results found for: INSULIN ?No results found for: TSH ?Lab Results  ?Component Value Date  ? CHOL 140 06/08/2021  ? HDL 34 (A) 06/08/2021  ? [place] 91 06/08/2021  ? TRIG 75 06/08/2021  ? ?Lab Results  ?Component Value Date  ? VD25OH 22.8 06/08/2021  ? VD25OH 62.5 03/11/2020  ? ?Lab Results  ?Component Value Date  ? WBC 17.3 (H) 04/08/2021  ? HGB 10.6 (A) 06/08/2021  ? HCT 33 (A) 06/08/2021  ? MCV 77.6 (L) 04/08/2021  ? PLT 496 (A) 06/08/2021  ? ?No results found for: IRON, TIBC, FERRITIN ? ?Attestation Statements:  ? ?Reviewed by clinician on day of visit: allergies, medications, problem list, medical history, surgical history, family history, social history, and previous encounter notes. ? ?I, Lizbeth Bark, RMA, am acting as transcriptionist for [employer], DO. ? ?I have reviewed the above documentation for accuracy and completeness, and I agree with the above. Jearld Lesch, DO ? ?

## 2021-09-21 ENCOUNTER — Other Ambulatory Visit (HOSPITAL_COMMUNITY): Payer: Self-pay

## 2021-09-27 ENCOUNTER — Ambulatory Visit (INDEPENDENT_AMBULATORY_CARE_PROVIDER_SITE_OTHER): Payer: No Typology Code available for payment source | Admitting: Adult Health

## 2021-10-11 ENCOUNTER — Ambulatory Visit (INDEPENDENT_AMBULATORY_CARE_PROVIDER_SITE_OTHER): Payer: No Typology Code available for payment source | Admitting: Adult Health

## 2021-10-11 ENCOUNTER — Other Ambulatory Visit (HOSPITAL_COMMUNITY): Payer: Self-pay

## 2021-10-11 ENCOUNTER — Encounter (INDEPENDENT_AMBULATORY_CARE_PROVIDER_SITE_OTHER): Payer: Self-pay | Admitting: Adult Health

## 2021-10-11 VITALS — BP 104/72 | HR 85 | Temp 98.4°F | Ht 64.0 in | Wt 233.0 lb

## 2021-10-11 DIAGNOSIS — E669 Obesity, unspecified: Secondary | ICD-10-CM

## 2021-10-11 DIAGNOSIS — I152 Hypertension secondary to endocrine disorders: Secondary | ICD-10-CM

## 2021-10-11 DIAGNOSIS — E1159 Type 2 diabetes mellitus with other circulatory complications: Secondary | ICD-10-CM

## 2021-10-11 DIAGNOSIS — E1129 Type 2 diabetes mellitus with other diabetic kidney complication: Secondary | ICD-10-CM | POA: Diagnosis not present

## 2021-10-11 DIAGNOSIS — Z6841 Body Mass Index (BMI) 40.0 and over, adult: Secondary | ICD-10-CM

## 2021-10-11 DIAGNOSIS — Z7985 Long-term (current) use of injectable non-insulin antidiabetic drugs: Secondary | ICD-10-CM

## 2021-10-11 MED ORDER — TIRZEPATIDE 10 MG/0.5ML ~~LOC~~ SOAJ
10.0000 mg | SUBCUTANEOUS | 0 refills | Status: DC
  Filled 2021-10-11 – 2021-10-25 (×2): qty 2, 28d supply, fill #0

## 2021-10-12 NOTE — Progress Notes (Signed)
Chief Complaint:   OBESITY Cathy Ho is here to discuss her progress with her obesity treatment plan along with follow-up of her obesity related diagnoses. Cathy Ho is on the Category 4 Plan and states she is following her eating plan approximately 70% of the time. Cathy Ho states she is doing Zumba for 45 minutes 1 time per week.  Today's visit was #: 25 Starting weight: 284 lbs Starting date: 10/14/2019 Today's weight: 233 lbs Today's date: 10/11/2021 Total lbs lost to date: 51 Total lbs lost since last in-office visit: 0  Interim History:  On 12/24/2020, Cathy Ho started on Mounjaro 10 mg once week. g, which replaced Wegovy 2.4 mg.   She has remained on Mounjaro 10 mg weekly.  When she eats " off plan", she skips meals due to poor appetite.  Subjective:   1. Type 2 diabetes mellitus with microalbuminuria, without long-term current use of insulin (Cathy Ho) Cathy Ho's home readings  fasting: 80-100, she denies symptoms of hypoglycemia.  Per patient her last A1c was 5.4-PCP obtained labs on 08/16/2021. PCP manages Xigduo XR 08/998 mg daily.  HWW manages Mounjaro '10mg'$  once weekly. She denies mass in neck, dysphagia, dyspepsia, persistent hoarseness, abd pain, or N/V/Constipation.  2. Hypertension associated with type 2 diabetes mellitus (Cathy Ho) Blood pressure is stable, however a little soft at office visit today.   She denies symptoms of hypotension.  PCP manages lisinopril/HCTZ 10/12.5 mg daily.   She denies lower extremity edema.  Assessment/Plan:   1. Type 2 diabetes mellitus with microalbuminuria, without long-term current use of insulin (Cathy Ho) Britini will continue Mounjaro 10 mg weekly, and we will refill for 1 month. Continue Xigduo XR 08/998 mg daily per PCP.  - tirzepatide (MOUNJARO) 10 MG/0.5ML Pen; Inject 10 mg into the skin once a week.  Dispense: 2 mL; Refill: 0  2. Hypertension associated with type 2 diabetes mellitus (Cathy Ho) Cathy Ho will continue to monitor her blood  pressure at home and bring in her readings to next OV.  3. Obesity with current BMI- 40.1 Cathy Ho is currently in the action stage of change. As such, her goal is to continue with weight loss efforts. She has agreed to the Category 4 Plan.   Exercise goals: As is.   Behavioral modification strategies: increasing lean protein intake, decreasing simple carbohydrates, meal planning and cooking strategies, keeping healthy foods in the home, and planning for success.  Cathy Ho has agreed to follow-up with our clinic in 4 weeks. She was informed of the importance of frequent follow-up visits to maximize her success with intensive lifestyle modifications for her multiple health conditions.   Objective:   Blood pressure 104/72, pulse 85, temperature 98.4 F (36.9 C), height '5\' 4"'$  (1.626 m), weight 233 lb (105.7 kg), SpO2 98 %. Body mass index is 39.99 kg/m.  General: Cooperative, alert, well developed, in no acute distress. HEENT: Conjunctivae and lids unremarkable. Cardiovascular: Regular rhythm.  Lungs: Normal work of breathing. Neurologic: No focal deficits.   Lab Results  Component Value Date   CREATININE 0.7 06/08/2021   BUN 17 06/08/2021   NA 137 06/08/2021   K 4.2 06/08/2021   CL 101 06/08/2021   CO2 28 (A) 06/08/2021   Lab Results  Component Value Date   ALT 25 06/08/2021   AST 16 06/08/2021   ALKPHOS 99 06/08/2021   BILITOT 0.7 03/25/2021   Lab Results  Component Value Date   HGBA1C 5.9 06/08/2021   HGBA1C 5.8 (H) 03/25/2021   HGBA1C 5.6 12/04/2020   HGBA1C  5.9 03/11/2020   No results found for: "INSULIN" No results found for: "TSH" Lab Results  Component Value Date   CHOL 140 06/08/2021   HDL 34 (A) 06/08/2021   LDLCALC 91 06/08/2021   TRIG 75 06/08/2021   Lab Results  Component Value Date   VD25OH 22.8 06/08/2021   VD25OH 62.5 03/11/2020   Lab Results  Component Value Date   WBC 17.3 (H) 04/08/2021   HGB 10.6 (A) 06/08/2021   HCT 33 (A) 06/08/2021    MCV 77.6 (L) 04/08/2021   PLT 496 (A) 06/08/2021   No results found for: "IRON", "TIBC", "FERRITIN"  Attestation Statements:   Reviewed by clinician on day of visit: allergies, medications, problem list, medical history, surgical history, family history, social history, and previous encounter notes.   Cathy Ho, am acting as transcriptionist for Mina Marble, NP.  I have reviewed the above documentation for accuracy and completeness, and I agree with the above. - Cathy Groene d. Mecca Guitron, NP-C

## 2021-10-25 ENCOUNTER — Ambulatory Visit (INDEPENDENT_AMBULATORY_CARE_PROVIDER_SITE_OTHER): Payer: No Typology Code available for payment source | Admitting: Bariatrics

## 2021-10-25 ENCOUNTER — Encounter (INDEPENDENT_AMBULATORY_CARE_PROVIDER_SITE_OTHER): Payer: Self-pay | Admitting: Adult Health

## 2021-10-25 ENCOUNTER — Other Ambulatory Visit (INDEPENDENT_AMBULATORY_CARE_PROVIDER_SITE_OTHER): Payer: Self-pay | Admitting: Bariatrics

## 2021-10-25 ENCOUNTER — Other Ambulatory Visit (HOSPITAL_COMMUNITY): Payer: Self-pay

## 2021-10-25 MED ORDER — TIRZEPATIDE 7.5 MG/0.5ML ~~LOC~~ SOAJ
7.5000 mg | SUBCUTANEOUS | 0 refills | Status: DC
  Filled 2021-10-25: qty 2, 28d supply, fill #0

## 2021-10-25 NOTE — Telephone Encounter (Signed)
Please reveiw

## 2021-10-27 ENCOUNTER — Other Ambulatory Visit (HOSPITAL_COMMUNITY): Payer: Self-pay

## 2021-11-08 ENCOUNTER — Ambulatory Visit (INDEPENDENT_AMBULATORY_CARE_PROVIDER_SITE_OTHER): Payer: No Typology Code available for payment source | Admitting: Bariatrics

## 2021-11-15 ENCOUNTER — Ambulatory Visit (INDEPENDENT_AMBULATORY_CARE_PROVIDER_SITE_OTHER): Payer: No Typology Code available for payment source | Admitting: Bariatrics

## 2021-11-15 ENCOUNTER — Other Ambulatory Visit (HOSPITAL_COMMUNITY): Payer: Self-pay

## 2021-11-15 ENCOUNTER — Encounter (INDEPENDENT_AMBULATORY_CARE_PROVIDER_SITE_OTHER): Payer: Self-pay | Admitting: Bariatrics

## 2021-11-15 VITALS — BP 115/79 | HR 73 | Temp 98.4°F | Ht 64.0 in | Wt 238.0 lb

## 2021-11-15 DIAGNOSIS — E785 Hyperlipidemia, unspecified: Secondary | ICD-10-CM

## 2021-11-15 DIAGNOSIS — Z6841 Body Mass Index (BMI) 40.0 and over, adult: Secondary | ICD-10-CM

## 2021-11-15 DIAGNOSIS — F5089 Other specified eating disorder: Secondary | ICD-10-CM

## 2021-11-15 DIAGNOSIS — E1159 Type 2 diabetes mellitus with other circulatory complications: Secondary | ICD-10-CM | POA: Diagnosis not present

## 2021-11-15 DIAGNOSIS — Z7984 Long term (current) use of oral hypoglycemic drugs: Secondary | ICD-10-CM

## 2021-11-15 DIAGNOSIS — E1169 Type 2 diabetes mellitus with other specified complication: Secondary | ICD-10-CM

## 2021-11-15 DIAGNOSIS — Z7985 Long-term (current) use of injectable non-insulin antidiabetic drugs: Secondary | ICD-10-CM

## 2021-11-15 DIAGNOSIS — I152 Hypertension secondary to endocrine disorders: Secondary | ICD-10-CM

## 2021-11-15 DIAGNOSIS — E1129 Type 2 diabetes mellitus with other diabetic kidney complication: Secondary | ICD-10-CM

## 2021-11-15 DIAGNOSIS — E669 Obesity, unspecified: Secondary | ICD-10-CM

## 2021-11-15 DIAGNOSIS — E559 Vitamin D deficiency, unspecified: Secondary | ICD-10-CM

## 2021-11-15 MED ORDER — TIRZEPATIDE 10 MG/0.5ML ~~LOC~~ SOAJ
10.0000 mg | SUBCUTANEOUS | 0 refills | Status: DC
  Filled 2021-11-15: qty 2, 28d supply, fill #0

## 2021-11-16 ENCOUNTER — Other Ambulatory Visit (HOSPITAL_COMMUNITY): Payer: Self-pay

## 2021-11-19 ENCOUNTER — Other Ambulatory Visit (HOSPITAL_COMMUNITY): Payer: Self-pay

## 2021-11-22 NOTE — Progress Notes (Unsigned)
Chief Complaint:   OBESITY Cathy Ho is here to discuss her progress with her obesity treatment plan along with follow-up of her obesity related diagnoses. Cathy Ho is on the Category 4 Plan and states she is following her eating plan approximately 65-70% of the time. Cathy Ho states she is doing 0 minutes 0 times per week.  Today's visit was #: 26 Starting weight: 284 lbs Starting date: 10/14/2019 Today's weight: 238 lbs Today's date: 11/15/2021 Total lbs lost to date: 46 Total lbs lost since last in-office visit: 0  Interim History: Cathy Ho is up 5 pounds since her last visit.  She has been doing well overall.  She has been feeling fatigued.  Subjective:   1. Type 2 diabetes mellitus with microalbuminuria, without long-term current use of insulin (HCC) Cathy Ho is taking Mounjaro and metformin.  2. Hypertension associated with type 2 diabetes mellitus ([place]) Cathy Ho is taking Zestoretic, and her blood pressure is stable.  3. Hyperlipidemia associated with type 2 diabetes mellitus ([place]) Cathy Ho is not on medications currently.  4. Vitamin D deficiency Cathy Ho is taking vitamin D.  5. Other disorder of eating Cathy Ho is working more and she will be going back to school.  Assessment/Plan:   1. Type 2 diabetes mellitus with microalbuminuria, without long-term current use of insulin (HCC) Cathy Ho will continue her medications, and we will refill Mounjaro 10 mg once weekly for 1 month.  - tirzepatide (MOUNJARO) 10 MG/0.5ML Pen; Inject 10 mg into the skin once a week.  Dispense: 2 mL; Refill: 0  2. Hypertension associated with type 2 diabetes mellitus (Cathy Ho) Cathy Ho will continue her medications as directed, and we will follow-up on her blood pressure at her next visit.  3. Hyperlipidemia associated with type 2 diabetes mellitus ([place]) Cathy Ho will continue to work on eliminating trans fats and decrease saturated fats.  4. Vitamin D deficiency Cathy Ho will continue her vitamin  D and B complex supplementation.  5. Other disorder of eating We discussed disordered eating and emotional eating strategies.  6. Obesity, Current BMI 40.9 Cathy Ho is currently in the action stage of change. As such, her goal is to continue with weight loss efforts. She has agreed to the Category 4 Plan.   Meal planning and intentional eating were discussed.  Exercise goals: She wants to begin exercising.  Behavioral modification strategies: increasing lean protein intake, decreasing simple carbohydrates, increasing vegetables, increasing water intake, decreasing eating out, no skipping meals, meal planning and cooking strategies, keeping healthy foods in the home, and planning for success.  Cathy Ho has agreed to follow-up with our clinic in 4 weeks. She was informed of the importance of frequent follow-up visits to maximize her success with intensive lifestyle modifications for her multiple health conditions.   Objective:   Blood pressure 115/79, pulse 73, temperature 98.4 F (36.9 C), height '5\' 4"'$  (1.626 m), weight 238 lb (108 kg), SpO2 100 %. Body mass index is 40.85 kg/m.  General: Cooperative, alert, well developed, in no acute distress. HEENT: Conjunctivae and lids unremarkable. Cardiovascular: Regular rhythm.  Lungs: Normal work of breathing. Neurologic: No focal deficits.   Lab Results  Component Value Date   CREATININE 0.7 06/08/2021   BUN 17 06/08/2021   NA 137 06/08/2021   K 4.2 06/08/2021   CL 101 06/08/2021   CO2 28 (A) 06/08/2021   Lab Results  Component Value Date   ALT 25 06/08/2021   AST 16 06/08/2021   ALKPHOS 99 06/08/2021   BILITOT 0.7 03/25/2021   Lab  Results  Component Value Date   HGBA1C 5.9 06/08/2021   HGBA1C 5.8 (H) 03/25/2021   HGBA1C 5.6 12/04/2020   HGBA1C 5.9 03/11/2020   No results found for: "INSULIN" No results found for: "TSH" Lab Results  Component Value Date   CHOL 140 06/08/2021   HDL 34 (A) 06/08/2021   LDLCALC 91  06/08/2021   TRIG 75 06/08/2021   Lab Results  Component Value Date   VD25OH 22.8 06/08/2021   VD25OH 62.5 03/11/2020   Lab Results  Component Value Date   WBC 17.3 (H) 04/08/2021   HGB 10.6 (A) 06/08/2021   HCT 33 (A) 06/08/2021   MCV 77.6 (L) 04/08/2021   PLT 496 (A) 06/08/2021   No results found for: "IRON", "TIBC", "FERRITIN"  Attestation Statements:   Reviewed by clinician on day of visit: allergies, medications, problem list, medical history, surgical history, family history, social history, and previous encounter notes.   Wilhemena Durie, am acting as Location manager for [employer], DO.  I have reviewed the above documentation for accuracy and completeness, and I agree with the above. Jearld Lesch, DO

## 2021-11-23 ENCOUNTER — Encounter (INDEPENDENT_AMBULATORY_CARE_PROVIDER_SITE_OTHER): Payer: Self-pay | Admitting: Bariatrics

## 2021-12-01 ENCOUNTER — Encounter (INDEPENDENT_AMBULATORY_CARE_PROVIDER_SITE_OTHER): Payer: Self-pay

## 2021-12-10 ENCOUNTER — Encounter: Payer: Self-pay | Admitting: Physician Assistant

## 2021-12-13 ENCOUNTER — Other Ambulatory Visit (HOSPITAL_COMMUNITY): Payer: Self-pay

## 2021-12-13 ENCOUNTER — Encounter (INDEPENDENT_AMBULATORY_CARE_PROVIDER_SITE_OTHER): Payer: Self-pay | Admitting: Adult Health

## 2021-12-13 ENCOUNTER — Ambulatory Visit (INDEPENDENT_AMBULATORY_CARE_PROVIDER_SITE_OTHER): Payer: No Typology Code available for payment source | Admitting: Adult Health

## 2021-12-13 VITALS — BP 103/68 | HR 82 | Temp 98.7°F | Ht 64.0 in | Wt 242.0 lb

## 2021-12-13 DIAGNOSIS — E1159 Type 2 diabetes mellitus with other circulatory complications: Secondary | ICD-10-CM | POA: Diagnosis not present

## 2021-12-13 DIAGNOSIS — E669 Obesity, unspecified: Secondary | ICD-10-CM

## 2021-12-13 DIAGNOSIS — I152 Hypertension secondary to endocrine disorders: Secondary | ICD-10-CM | POA: Diagnosis not present

## 2021-12-13 DIAGNOSIS — Z7984 Long term (current) use of oral hypoglycemic drugs: Secondary | ICD-10-CM

## 2021-12-13 DIAGNOSIS — R809 Proteinuria, unspecified: Secondary | ICD-10-CM

## 2021-12-13 DIAGNOSIS — E1129 Type 2 diabetes mellitus with other diabetic kidney complication: Secondary | ICD-10-CM | POA: Diagnosis not present

## 2021-12-13 DIAGNOSIS — Z7985 Long-term (current) use of injectable non-insulin antidiabetic drugs: Secondary | ICD-10-CM

## 2021-12-13 DIAGNOSIS — Z6841 Body Mass Index (BMI) 40.0 and over, adult: Secondary | ICD-10-CM

## 2021-12-13 MED ORDER — TIRZEPATIDE 10 MG/0.5ML ~~LOC~~ SOAJ
10.0000 mg | SUBCUTANEOUS | 0 refills | Status: DC
  Filled 2021-12-13: qty 2, 28d supply, fill #0

## 2021-12-14 LAB — INSULIN, RANDOM: INSULIN: 22 u[IU]/mL (ref 2.6–24.9)

## 2021-12-14 LAB — HEMOGLOBIN A1C
Est. average glucose Bld gHb Est-mCnc: 126 mg/dL
Hgb A1c MFr Bld: 6 % — ABNORMAL HIGH (ref 4.8–5.6)

## 2021-12-17 NOTE — Progress Notes (Unsigned)
Chief Complaint:   OBESITY Cathy Ho is here to discuss her progress with her obesity treatment plan along with follow-up of her obesity related diagnoses. Cathy Ho is on the Category 4 Plan and states she is following her eating plan approximately 65% of the time. Cathy Ho states she is walking, treadmill 10-45 minutes 1-2 times per week.  Today's visit was #: 31 Starting weight: 284 lbs Starting date: 10/14/2019 Today's weight: 242 lbs Today's date: 12/13/2021 Total lbs lost to date: 42 lbs Total lbs lost since last in-office visit: +4  Interim History: "Winging it" off plan, she has not been eating or snacking.  Chips and soda ***  Subjective:   1. Type 2 diabetes mellitus with microalbuminuria, without long-term current use of insulin (HCC) Currently on Xigduo XR 08-998 mg daily.(PCP manages). Fasting 90's, PP's 120's.  PCP checked A1c in May ***.  06/08/2021 A1c 5.9 at goal.   2. Hypertension associated with type 2 diabetes mellitus ([place]) Blood pressure is at goal at office visit    Assessment/Plan:   1. Type 2 diabetes mellitus with microalbuminuria, without long-term current use of insulin (HCC) Check labs - Hemoglobin A1c - Insulin, random  Refill - tirzepatide (MOUNJARO) 10 MG/0.5ML Pen; Inject 10 mg into the skin once a week.  Dispense: 2 mL; Refill: 0  2. Hypertension associated with type 2 diabetes mellitus ([place]) Continue daily Zestoretic.   3. Obesity, Current BMI 41.7 Pack meals and snacks when working.  Cathy Ho is currently in the action stage of change. As such, her goal is to continue with weight loss efforts. She has agreed to the Category 4 Plan.   Exercise goals:  As is.   Behavioral modification strategies: increasing lean protein intake, decreasing simple carbohydrates, no skipping meals, meal planning and cooking strategies, keeping healthy foods in the home, better snacking choices, and planning for success.  Cathy Ho has agreed to follow-up  with our clinic in 4 weeks. She was informed of the importance of frequent follow-up visits to maximize her success with intensive lifestyle modifications for her multiple health conditions.   Objective:   Blood pressure 103/68, pulse 82, temperature 98.7 F (37.1 C), height '5\' 4"'$  (1.626 m), weight 242 lb (109.8 kg), SpO2 98 %. Body mass index is 41.54 kg/m.  General: Cooperative, alert, well developed, in no acute distress. HEENT: Conjunctivae and lids unremarkable. Cardiovascular: Regular rhythm.  Lungs: Normal work of breathing. Neurologic: No focal deficits.   Lab Results  Component Value Date   CREATININE 0.7 06/08/2021   BUN 17 06/08/2021   NA 137 06/08/2021   K 4.2 06/08/2021   CL 101 06/08/2021   CO2 28 (A) 06/08/2021   Lab Results  Component Value Date   ALT 25 06/08/2021   AST 16 06/08/2021   ALKPHOS 99 06/08/2021   BILITOT 0.7 03/25/2021   Lab Results  Component Value Date   HGBA1C 6.0 (H) 12/13/2021   HGBA1C 5.9 06/08/2021   HGBA1C 5.8 (H) 03/25/2021   HGBA1C 5.6 12/04/2020   HGBA1C 5.9 03/11/2020   Lab Results  Component Value Date   INSULIN 22.0 12/13/2021   No results found for: "TSH" Lab Results  Component Value Date   CHOL 140 06/08/2021   HDL 34 (A) 06/08/2021   LDLCALC 91 06/08/2021   TRIG 75 06/08/2021   Lab Results  Component Value Date   VD25OH 22.8 06/08/2021   VD25OH 62.5 03/11/2020   Lab Results  Component Value Date   WBC 17.3 (H)  04/08/2021   HGB 10.6 (A) 06/08/2021   HCT 33 (A) 06/08/2021   MCV 77.6 (L) 04/08/2021   PLT 496 (A) 06/08/2021   No results found for: "IRON", "TIBC", "FERRITIN"  Attestation Statements:   Reviewed by clinician on day of visit: allergies, medications, problem list, medical history, surgical history, family history, social history, and previous encounter notes.  I, Davy Pique, RMA, am acting as Location manager for Mina Marble, NP.  I have reviewed the above documentation for accuracy and  completeness, and I agree with the above. -  ***

## 2022-01-03 ENCOUNTER — Other Ambulatory Visit (HOSPITAL_COMMUNITY): Payer: Self-pay

## 2022-01-04 ENCOUNTER — Other Ambulatory Visit (HOSPITAL_COMMUNITY): Payer: Self-pay

## 2022-01-04 MED ORDER — LISINOPRIL-HYDROCHLOROTHIAZIDE 10-12.5 MG PO TABS
1.0000 | ORAL_TABLET | Freq: Every day | ORAL | 3 refills | Status: DC
  Filled 2022-01-04 – 2022-01-14 (×2): qty 90, 90d supply, fill #0
  Filled 2022-04-23: qty 90, 90d supply, fill #1
  Filled 2022-09-19: qty 90, 90d supply, fill #2

## 2022-01-12 ENCOUNTER — Other Ambulatory Visit (HOSPITAL_COMMUNITY): Payer: Self-pay

## 2022-01-14 ENCOUNTER — Other Ambulatory Visit (HOSPITAL_COMMUNITY): Payer: Self-pay

## 2022-01-14 ENCOUNTER — Ambulatory Visit (INDEPENDENT_AMBULATORY_CARE_PROVIDER_SITE_OTHER): Payer: No Typology Code available for payment source | Admitting: Plastic Surgery

## 2022-01-14 VITALS — BP 128/76 | HR 85 | Ht 64.0 in | Wt 245.0 lb

## 2022-01-14 DIAGNOSIS — N62 Hypertrophy of breast: Secondary | ICD-10-CM

## 2022-01-14 DIAGNOSIS — M542 Cervicalgia: Secondary | ICD-10-CM | POA: Diagnosis not present

## 2022-01-14 DIAGNOSIS — M545 Low back pain, unspecified: Secondary | ICD-10-CM

## 2022-01-14 DIAGNOSIS — L989 Disorder of the skin and subcutaneous tissue, unspecified: Secondary | ICD-10-CM

## 2022-01-14 DIAGNOSIS — Z6841 Body Mass Index (BMI) 40.0 and over, adult: Secondary | ICD-10-CM

## 2022-01-14 DIAGNOSIS — M546 Pain in thoracic spine: Secondary | ICD-10-CM | POA: Diagnosis not present

## 2022-01-17 ENCOUNTER — Ambulatory Visit (INDEPENDENT_AMBULATORY_CARE_PROVIDER_SITE_OTHER): Payer: No Typology Code available for payment source | Admitting: Bariatrics

## 2022-01-17 ENCOUNTER — Encounter (INDEPENDENT_AMBULATORY_CARE_PROVIDER_SITE_OTHER): Payer: Self-pay | Admitting: Adult Health

## 2022-01-17 ENCOUNTER — Other Ambulatory Visit (HOSPITAL_COMMUNITY): Payer: Self-pay

## 2022-01-17 ENCOUNTER — Ambulatory Visit (INDEPENDENT_AMBULATORY_CARE_PROVIDER_SITE_OTHER): Payer: No Typology Code available for payment source | Admitting: Adult Health

## 2022-01-17 VITALS — BP 122/74 | HR 68 | Temp 98.6°F | Ht 64.0 in | Wt 244.0 lb

## 2022-01-17 DIAGNOSIS — E1129 Type 2 diabetes mellitus with other diabetic kidney complication: Secondary | ICD-10-CM | POA: Diagnosis not present

## 2022-01-17 DIAGNOSIS — Z7985 Long-term (current) use of injectable non-insulin antidiabetic drugs: Secondary | ICD-10-CM

## 2022-01-17 DIAGNOSIS — I152 Hypertension secondary to endocrine disorders: Secondary | ICD-10-CM | POA: Diagnosis not present

## 2022-01-17 DIAGNOSIS — E669 Obesity, unspecified: Secondary | ICD-10-CM | POA: Diagnosis not present

## 2022-01-17 DIAGNOSIS — Z6841 Body Mass Index (BMI) 40.0 and over, adult: Secondary | ICD-10-CM

## 2022-01-17 DIAGNOSIS — E1159 Type 2 diabetes mellitus with other circulatory complications: Secondary | ICD-10-CM

## 2022-01-17 MED ORDER — TIRZEPATIDE 10 MG/0.5ML ~~LOC~~ SOAJ
10.0000 mg | SUBCUTANEOUS | 0 refills | Status: DC
  Filled 2022-01-17: qty 2, 28d supply, fill #0

## 2022-01-17 NOTE — Progress Notes (Unsigned)
Chief Complaint:   OBESITY Cathy Ho is here to discuss her progress with her obesity treatment plan along with follow-up of her obesity related diagnoses. Cathy Ho is on the Category 4 Plan and states she is following her eating plan approximately 70% of the time. Cathy Ho states she is walking 45 minutes 1 times per week.  Today's visit was #: 28 Starting weight: 284 lbs Starting date: 10/14/2019 Today's weight: 244 lbs Today's date: 01/17/2022 Total lbs lost to date: 40 lbs Total lbs lost since last in-office visit: +2 lbs  Interim History: September 2022, Ozempic was replaced with [facility]. Currently on Mounjaro 10 mg once weekly.  Snacking several 100 calorie snacks when studying.  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  Graduates July 2024. LPN-RN bridge.   Subjective:   1. Type 2 diabetes mellitus with microalbuminuria, without long-term current use of insulin ([place]) Discussed labs with patient today. A1c *** *** 2. Hypertension associated with type 2 diabetes mellitus ([place]) Blood pressure and heart rate excellent at office visit.   Assessment/Plan:   1. Type 2 diabetes mellitus with microalbuminuria, without long-term current use of insulin (HCC) Refill - tirzepatide (MOUNJARO) 10 MG/0.5ML Pen; Inject 10 mg into the skin once a week.  Dispense: 2 mL; Refill: 0  2. Hypertension associated with type 2 diabetes mellitus (HCC) Continue ***  3. Obesity, Current BMI 42.0 1) snack calorie 300 per day.  2) take lunch to work  Cathy Ho is currently in the action stage of change. As such, her goal is to continue with weight loss efforts. She has agreed to the Category 4 Plan.   Exercise goals:  As is.   Behavioral modification strategies: increasing lean protein intake, decreasing simple carbohydrates, meal planning and cooking strategies, keeping healthy foods in the home, and planning for success.  Cathy Ho has  agreed to follow-up with our clinic in 4 weeks. She was informed of the importance of frequent follow-up visits to maximize her success with intensive lifestyle modifications for her multiple health conditions.   Objective:   Blood pressure 122/74, pulse 68, temperature 98.6 F (37 C), height '5\' 4"'$  (1.626 m), weight 244 lb (110.7 kg), SpO2 99 %. Body mass index is 41.88 kg/m.  General: Cooperative, alert, well developed, in no acute distress. HEENT: Conjunctivae and lids unremarkable. Cardiovascular: Regular rhythm.  Lungs: Normal work of breathing. Neurologic: No focal deficits.   Lab Results  Component Value Date   CREATININE 0.7 06/08/2021   BUN 17 06/08/2021   NA 137 06/08/2021   K 4.2 06/08/2021   CL 101 06/08/2021   CO2 28 (A) 06/08/2021   Lab Results  Component Value Date   ALT 25 06/08/2021   AST 16 06/08/2021   ALKPHOS 99 06/08/2021   BILITOT 0.7 03/25/2021   Lab Results  Component Value Date   HGBA1C 6.0 (H) 12/13/2021   HGBA1C 5.9 06/08/2021   HGBA1C 5.8 (H) 03/25/2021   HGBA1C 5.6 12/04/2020   HGBA1C 5.9 03/11/2020   Lab Results  Component Value Date   INSULIN 22.0 12/13/2021   No results found for: "TSH" Lab Results  Component Value Date   CHOL 140 06/08/2021   HDL 34 (A) 06/08/2021   LDLCALC 91 06/08/2021   TRIG 75 06/08/2021   Lab Results  Component Value Date   VD25OH 22.8 06/08/2021   VD25OH 62.5 03/11/2020   Lab Results  Component Value Date  WBC 17.3 (H) 04/08/2021   HGB 10.6 (A) 06/08/2021   HCT 33 (A) 06/08/2021   MCV 77.6 (L) 04/08/2021   PLT 496 (A) 06/08/2021   No results found for: "IRON", "TIBC", "FERRITIN"  Attestation Statements:   Reviewed by clinician on day of visit: allergies, medications, problem list, medical history, surgical history, family history, social history, and previous encounter notes.  I, Davy Pique, RMA, am acting as Location manager for Cathy Marble, NP.  I have reviewed the above documentation  for accuracy and completeness, and I agree with the above. -  ***

## 2022-01-17 NOTE — Progress Notes (Signed)
Referring Provider [name], DO [address] [place],  [name]   CC:  Breast hypertrophy   Cathy Ho is an 41 y.o. female.  HPI:   The patient is a 41 y.o. female with a history of mammary hyperplasia for several years.  She also has a right medial breast cyst.   She has extremely [name] causing symptoms that include the following: Back pain in the upper and lower back, including neck pain. She pulls or pins her bra straps to provide better lift and relief of the pressure and pain. She notices relief by holding her breast up manually.  Her shoulder straps cause grooves and pain and pressure that requires padding for relief. Pain medication is sometimes required with motrin and tylenol.  Activities that are hindered by enlarged breasts include: exercise and running.  She has tried supportive clothing as well as fitted bras without improvement.     Mammogram history: 11/22 benign.  Family history of breast cancer:  none.  Tobacco use:  none.   The patient expresses the desire to pursue surgical intervention.  Diabetes, last hbg A1C was 6  The BMI = 42.  Preoperative bra size = J cup.   No Known Allergies  Outpatient Encounter Medications as of 01/14/2022  Medication Sig Note   alclomethasone (ACLOVATE) 0.05 % cream Apply 1 application topically 1 (one) to 2 (two) times daily as needed for rash.    Dapagliflozin-metFORMIN HCl ER (XIGDUO XR) 08-998 MG TB24 Take 1 tablet by mouth daily in the morning after a meal    ergocalciferol (VITAMIN D2) 1.25 MG (50000 UT) capsule Take 1 capsule (50,000 Units total) by mouth once a week.    estradiol (ESTRACE VAGINAL) 0.1 MG/GM vaginal cream Place 1 Applicatorful ( a small bead) inside vagina every other day.    glucose blood (FREESTYLE LITE) test strip Use to check glucose once per day    lisinopril-hydrochlorothiazide (ZESTORETIC) 10-12.5 MG tablet Take 1 tablet by mouth daily.    mupirocin ointment  (BACTROBAN) 2 % Apply 1 application topically 2 (two) times daily.    triamcinolone ointment (KENALOG) 0.1 % Apply 1 application topically daily as needed.    [DISCONTINUED] ibuprofen (ADVIL) 800 MG tablet Take 1 tablet (800 mg total) by mouth every 8 (eight) hours as needed for moderate pain. For AFTER surgery only 03/17/2021: For use after procedure   [DISCONTINUED] senna-docusate (SENOKOT-S) 8.6-50 MG tablet Take 2 tablets by mouth at bedtime. For AFTER surgery, do not take if having diarrhea 03/17/2021: For use after procedure   [DISCONTINUED] tirzepatide (MOUNJARO) 10 MG/0.5ML Pen Inject 10 mg into the skin once a week.    No facility-administered encounter medications on file as of 01/14/2022.     Past Medical History:  Diagnosis Date   Anemia    Arthritis    Back pain    Chest pain    COVID 08/2020   Runny nose, fever, HA symptoms resolved   Diabetes mellitus without complication ([place]) 1914   type 2   Fibroids 2010   Hidradenitis suppurativa    Hypertension    Joint pain    Low iron    PCOS (polycystic ovarian syndrome)    Prolapsed uterus 2010   Sleep apnea    cpap, pt does not know settings   Vitamin D deficiency    Wears glasses     Past Surgical History:  Procedure Laterality Date   carbuckle cyst in back removed  02/2005   COLONOSCOPY N/A 08/15/2013   Procedure: COLONOSCOPY;  Surgeon: Inda Castle, MD;  Location: [facility];  Service: Endoscopy;  Laterality: N/A;   DILATION AND CURETTAGE OF UTERUS  2011   Endometrial hyperplasia with atypia, submucosal fibroid   HYSTEROSCOPY  2011   HYSTEROSCOPY WITH D & C N/A 03/03/2021   Procedure: DILATATION AND CURETTAGE /HYSTEROSCOPY;  Surgeon: Osborne Oman, MD;  Location: [facility];  Service: Gynecology;  Laterality: N/A;   ROBOTIC ASSISTED LAPAROSCOPIC HYSTERECTOMY AND SALPINGECTOMY N/A 04/08/2021   Procedure: XI ROBOTIC ASSISTED LAPAROSCOPIC HYSTERECTOMY AND SALPINGECTOMY;  Surgeon: Lafonda Mosses, MD;  Location: [facility];  Service: Gynecology;  Laterality: N/A;   ROBOTIC ASSISTED LAPAROSCOPIC SACROCOLPOPEXY N/A 04/08/2021   Procedure: XI ROBOTIC ASSISTED LAPAROSCOPIC SACROCOLPOPEXY;  Surgeon: Ardis Hughs, MD;  Location: [facility];  Service: Urology;  Laterality: N/A;   SENTINEL NODE BIOPSY N/A 04/08/2021   Procedure: SENTINEL NODE BIOPSY;  Surgeon: Lafonda Mosses, MD;  Location: [facility];  Service: Gynecology;  Laterality: N/A;   WISDOM TOOTH EXTRACTION  1998    Family History  Problem Relation Age of Onset   Hypertension Mother    Diabetes Mother    Obesity Mother    Hypertension Father    Diabetes Father    Sleep apnea Father    Obesity Father    Seizures Sister    Stroke Maternal Aunt    Diabetes Maternal Aunt    Hypertension Maternal Aunt    Diabetes Maternal Uncle    Hypertension Maternal Grandfather    Heart disease Maternal Grandfather    Diabetes Maternal Grandfather    Diabetes Paternal Grandmother    Heart disease Paternal Grandmother    Breast cancer Cousin    Colon cancer Neg Hx    Liver disease Neg Hx    Kidney disease Neg Hx    Ovarian cancer Neg Hx    Endometrial cancer Neg Hx    Pancreatic cancer Neg Hx    Prostate cancer Neg Hx     Social History   Social History Narrative   Not on file     Review of Systems General: Denies fevers, chills, weight loss CV: Denies chest pain, shortness of breath, palpitations   Physical Exam    01/17/2022    7:00 AM 01/14/2022    1:47 PM 12/13/2021    7:00 AM  Vitals with BMI  Height '5\' 4"'$  '5\' 4"'$  '5\' 4"'$   Weight 244 lbs 245 lbs 242 lbs  BMI 41.86 16.10 96.04  Systolic 540 981 191  Diastolic 74 76 68  Pulse 68 85 82    General:  No acute distress,  Alert and oriented, Non-Toxic, Normal speech and affect Breast: No easily palpable breast masses on physical exam, significant breast ptosis and macromastia. Her breasts are extremely Ho and fairly symmetric.  She has hyperpigmentation of  the inframammary area on both sides.  The sternal to nipple distance on the right is 41 cm and the left is 41 cm.  The IMF distance is 18 cm on the right and 18 cm on the left.  Base width is 23 bilaterally. Assessment/Plan   The patient has bilateral symptomatic macromastia.  She is a good candidate for a breast reduction.  I think we should remove her right medial breast cyst which appears to be a sebaceus cyst first.  I will refer to General surgery for this.  She is interested in pursuing surgical treatment.  She has  tried supportive garments and fitted bras with no relief.  The details of breast reduction surgery were discussed.  I explained the procedure in detail along the with the expected scars.  The risks were discussed in detail and include bleeding, infection, damage to surrounding structures, need for additional procedures, nipple loss, change in nipple sensation, persistent pain, contour irregularities and asymmetries.  I explained that breast feeding is often not possible after breast reduction surgery.  We discussed the expected postoperative course with an overall recovery period of about 1 month.  She demonstrated full understanding of all risks.  We discussed her personal risk factors that include high bmi.  The patient is interested in pursuing surgical treatment.  The estimated excess breast tissue to be removed at the time of surgery = 1000 grams on the left and 1000 grams on the right. Lennice Sites 01/17/2022, 8:04 AM

## 2022-01-21 ENCOUNTER — Telehealth: Payer: Self-pay

## 2022-01-21 NOTE — Telephone Encounter (Signed)
Faxed surgical referral to [facility] with confirmed receipt.

## 2022-01-26 ENCOUNTER — Telehealth: Payer: Self-pay

## 2022-01-26 NOTE — Telephone Encounter (Signed)
Referral was faxed on 9/29 to [facility]. I called pt 10/3 to see if they contacted her. She said she had not received a call from them yet. I adv that I would call to see if they received the fax and if they could be on the lookout for it. I adv her to call our office back if she had not heard anything in a week. Pt conveyed understanding.

## 2022-01-26 NOTE — Telephone Encounter (Signed)
-----   Message from Lennice Sites, MD sent at 01/26/2022  6:43 AM EDT ----- This is for Cathy Ho [id number].  Sorry I thought I had attached the patient.  My note explains the situation I think or you can ask me.   Thanks, [name] ----- Message ----- From: Lindon Romp, CMA Sent: 01/24/2022  10:42 AM EDT To: Lennice Sites, MD  What patient is this for?  ----- Message ----- From: Lennice Sites, MD Sent: 01/17/2022   8:25 AM EDT To: Lindon Romp, CMA  Please help facilitate refer to [facility].  Even though it may be a sebaceous cyst it is a breast mass so I have decided it is better to send to them since breast mass excision is not in my scope.  Please also let the patient know. Thanks, [employer]

## 2022-02-21 ENCOUNTER — Ambulatory Visit (INDEPENDENT_AMBULATORY_CARE_PROVIDER_SITE_OTHER): Payer: No Typology Code available for payment source | Admitting: Adult Health

## 2022-02-25 ENCOUNTER — Other Ambulatory Visit (HOSPITAL_COMMUNITY): Payer: Self-pay

## 2022-02-28 ENCOUNTER — Other Ambulatory Visit (HOSPITAL_COMMUNITY): Payer: Self-pay

## 2022-02-28 MED ORDER — XIGDUO XR 5-1000 MG PO TB24
1.0000 | ORAL_TABLET | Freq: Every morning | ORAL | 3 refills | Status: DC
  Filled 2022-02-28: qty 90, 90d supply, fill #0
  Filled 2022-05-27: qty 90, 90d supply, fill #1
  Filled 2022-09-19: qty 90, 90d supply, fill #2
  Filled 2023-02-04: qty 90, 90d supply, fill #3

## 2022-03-11 ENCOUNTER — Other Ambulatory Visit: Payer: Self-pay | Admitting: Family Medicine

## 2022-03-11 DIAGNOSIS — Z1231 Encounter for screening mammogram for malignant neoplasm of breast: Secondary | ICD-10-CM

## 2022-03-15 ENCOUNTER — Encounter (HOSPITAL_BASED_OUTPATIENT_CLINIC_OR_DEPARTMENT_OTHER): Payer: Self-pay | Admitting: Surgery

## 2022-03-15 ENCOUNTER — Other Ambulatory Visit: Payer: Self-pay

## 2022-03-15 ENCOUNTER — Other Ambulatory Visit (HOSPITAL_COMMUNITY): Payer: Self-pay

## 2022-03-15 NOTE — Progress Notes (Signed)
   03/15/22 1402  PAT Phone Screen  Is the patient taking a GLP-1 receptor agonist? Yes  Has the patient been informed on holding medication? Yes (patient informed to hold mounjaro 7 days prior to surgery)  Do You Have Diabetes? Yes  Do You Have Hypertension? Yes  Have You Ever Been to the ER for Asthma? No  Have You Taken Oral Steroids in the Past 3 Months? No  Do you Take Phenteramine or any Other Diet Drugs? No  Recent  Lab Work, EKG, CXR? No  Do you have a history of heart problems? No  Any Recent Hospitalizations? No  Height '5\' 4"'$  (1.626 m)  Weight 109.8 kg  Pat Appointment Scheduled Yes

## 2022-03-16 ENCOUNTER — Ambulatory Visit
Admission: RE | Admit: 2022-03-16 | Discharge: 2022-03-16 | Disposition: A | Discharge status: Home / Self Care | Payer: No Typology Code available for payment source | Source: Ambulatory Visit | Attending: Family Medicine | Admitting: Family Medicine

## 2022-03-16 ENCOUNTER — Ambulatory Visit: Payer: No Typology Code available for payment source

## 2022-03-16 DIAGNOSIS — Z1231 Encounter for screening mammogram for malignant neoplasm of breast: Secondary | ICD-10-CM

## 2022-03-22 ENCOUNTER — Other Ambulatory Visit (HOSPITAL_COMMUNITY): Payer: Self-pay

## 2022-03-22 ENCOUNTER — Encounter (HOSPITAL_BASED_OUTPATIENT_CLINIC_OR_DEPARTMENT_OTHER)
Admission: RE | Admit: 2022-03-22 | Discharge: 2022-03-22 | Disposition: A | Discharge status: Home / Self Care | Payer: No Typology Code available for payment source | Source: Ambulatory Visit | Attending: Surgery | Admitting: Surgery

## 2022-03-22 DIAGNOSIS — Z01812 Encounter for preprocedural laboratory examination: Secondary | ICD-10-CM | POA: Insufficient documentation

## 2022-03-22 LAB — BASIC METABOLIC PANEL
Anion gap: 11 (ref 5–15)
BUN: 17 mg/dL (ref 6–20)
CO2: 24 mmol/L (ref 22–32)
Calcium: 9.9 mg/dL (ref 8.9–10.3)
Chloride: 105 mmol/L (ref 98–111)
Creatinine, Ser: 0.77 mg/dL (ref 0.44–1.00)
GFR, Estimated: >60 mL/min (ref >60)
Glucose, Bld: 88 mg/dL (ref 70–99)
Potassium: 4.1 mmol/L (ref 3.5–5.1)
Sodium: 140 mmol/L (ref 135–145)

## 2022-03-22 MED ORDER — ERYTHROMYCIN 5 MG/GM OP OINT
1.0000 | TOPICAL_OINTMENT | Freq: Two times a day (BID) | OPHTHALMIC | 0 refills | Status: DC
  Filled 2022-03-22: qty 3.5, 14d supply, fill #0

## 2022-03-23 ENCOUNTER — Ambulatory Visit: Payer: Self-pay | Admitting: Surgery

## 2022-03-23 NOTE — Anesthesia Preprocedure Evaluation (Signed)
Anesthesia Evaluation  Patient identified by MRN, date of birth, ID band Patient awake    Reviewed: Allergy & Precautions, NPO status , Patient's Chart, lab work & pertinent test results  Airway Mallampati: II  TM Distance: >3 FB Neck ROM: Full    Dental no notable dental hx. (+) Teeth Intact, Dental Advisory Given   Pulmonary sleep apnea and Continuous Positive Airway Pressure Ventilation    Pulmonary exam normal breath sounds clear to auscultation       Cardiovascular hypertension, Pt. on medications Normal cardiovascular exam Rhythm:Regular Rate:Normal     Neuro/Psych    GI/Hepatic   Endo/Other  diabetes  Morbid obesity  Renal/GU      Musculoskeletal  (+) Arthritis ,    Abdominal   Peds  Hematology   Anesthesia Other Findings   Reproductive/Obstetrics                             Anesthesia Physical Anesthesia Plan  ASA: 3  Anesthesia Plan: General   Post-op Pain Management:    Induction: Intravenous  PONV Risk Score and Plan: 4 or greater and Treatment may vary due to age or medical condition, Ondansetron, Midazolam and Dexamethasone  Airway Management Planned: LMA  Additional Equipment: None  Intra-op Plan:   Post-operative Plan:   Informed Consent: I have reviewed the patients History and Physical, chart, labs and discussed the procedure including the risks, benefits and alternatives for the proposed anesthesia with the patient or authorized representative who has indicated his/her understanding and acceptance.     Dental advisory given  Plan Discussed with: CRNA  Anesthesia Plan Comments: (Pt on Monjaro Check last dose 11/20)       Anesthesia Quick Evaluation

## 2022-03-24 ENCOUNTER — Ambulatory Visit (HOSPITAL_BASED_OUTPATIENT_CLINIC_OR_DEPARTMENT_OTHER)
Admission: RE | Admit: 2022-03-24 | Discharge: 2022-03-24 | Disposition: A | Discharge status: Home / Self Care | Payer: No Typology Code available for payment source | Attending: Surgery | Admitting: Surgery

## 2022-03-24 ENCOUNTER — Other Ambulatory Visit: Payer: Self-pay

## 2022-03-24 ENCOUNTER — Encounter (HOSPITAL_BASED_OUTPATIENT_CLINIC_OR_DEPARTMENT_OTHER): Payer: Self-pay | Admitting: Surgery

## 2022-03-24 ENCOUNTER — Other Ambulatory Visit (HOSPITAL_COMMUNITY): Payer: Self-pay

## 2022-03-24 ENCOUNTER — Encounter (HOSPITAL_BASED_OUTPATIENT_CLINIC_OR_DEPARTMENT_OTHER)
Admission: RE | Disposition: A | Discharge status: Home / Self Care | Payer: Self-pay | Source: Home / Self Care | Attending: Surgery

## 2022-03-24 ENCOUNTER — Ambulatory Visit (HOSPITAL_BASED_OUTPATIENT_CLINIC_OR_DEPARTMENT_OTHER): Payer: No Typology Code available for payment source | Admitting: Anesthesiology

## 2022-03-24 DIAGNOSIS — Z6841 Body Mass Index (BMI) 40.0 and over, adult: Secondary | ICD-10-CM | POA: Insufficient documentation

## 2022-03-24 DIAGNOSIS — G4733 Obstructive sleep apnea (adult) (pediatric): Secondary | ICD-10-CM | POA: Diagnosis not present

## 2022-03-24 DIAGNOSIS — E119 Type 2 diabetes mellitus without complications: Secondary | ICD-10-CM | POA: Diagnosis not present

## 2022-03-24 DIAGNOSIS — I498 Other specified cardiac arrhythmias: Secondary | ICD-10-CM | POA: Insufficient documentation

## 2022-03-24 DIAGNOSIS — G4730 Sleep apnea, unspecified: Secondary | ICD-10-CM | POA: Diagnosis not present

## 2022-03-24 DIAGNOSIS — Z9989 Dependence on other enabling machines and devices: Secondary | ICD-10-CM

## 2022-03-24 DIAGNOSIS — Z79899 Other long term (current) drug therapy: Secondary | ICD-10-CM | POA: Insufficient documentation

## 2022-03-24 DIAGNOSIS — M1990 Unspecified osteoarthritis, unspecified site: Secondary | ICD-10-CM | POA: Insufficient documentation

## 2022-03-24 DIAGNOSIS — I10 Essential (primary) hypertension: Secondary | ICD-10-CM | POA: Insufficient documentation

## 2022-03-24 DIAGNOSIS — L723 Sebaceous cyst: Secondary | ICD-10-CM

## 2022-03-24 DIAGNOSIS — L720 Epidermal cyst: Secondary | ICD-10-CM | POA: Insufficient documentation

## 2022-03-24 DIAGNOSIS — E1129 Type 2 diabetes mellitus with other diabetic kidney complication: Secondary | ICD-10-CM

## 2022-03-24 HISTORY — PX: CYST EXCISION: SHX5701

## 2022-03-24 LAB — GLUCOSE, CAPILLARY
Glucose-Capillary: 86 mg/dL (ref 70–99)
Glucose-Capillary: 104 mg/dL — ABNORMAL HIGH (ref 70–99)

## 2022-03-24 SURGERY — CYST REMOVAL
Anesthesia: General | Site: Chest

## 2022-03-24 MED ORDER — DEXAMETHASONE SODIUM PHOSPHATE 4 MG/ML IJ SOLN
INTRAMUSCULAR | Status: DC | PRN
  Administered 2022-03-24: 10 mg via INTRAVENOUS

## 2022-03-24 MED ORDER — CEFAZOLIN SODIUM-DEXTROSE 2-4 GM/100ML-% IV SOLN
2.0000 g | INTRAVENOUS | Status: AC
  Administered 2022-03-24: 2 g via INTRAVENOUS

## 2022-03-24 MED ORDER — FENTANYL CITRATE (PF) 100 MCG/2ML IJ SOLN
INTRAMUSCULAR | Status: AC
  Filled 2022-03-24: qty 2

## 2022-03-24 MED ORDER — ACETAMINOPHEN 500 MG PO TABS
ORAL_TABLET | ORAL | Status: AC
  Filled 2022-03-24: qty 2

## 2022-03-24 MED ORDER — ACETAMINOPHEN 500 MG PO TABS
1000.0000 mg | ORAL_TABLET | ORAL | Status: AC
  Administered 2022-03-24: 1000 mg via ORAL

## 2022-03-24 MED ORDER — OXYCODONE HCL 5 MG PO TABS: 5.0000 mg | ORAL_TABLET | Freq: Once | ORAL | Status: DC | PRN

## 2022-03-24 MED ORDER — HYDROMORPHONE HCL 1 MG/ML IJ SOLN: 0.2500 mg | INTRAMUSCULAR | Status: DC | PRN

## 2022-03-24 MED ORDER — MIDAZOLAM HCL 5 MG/5ML IJ SOLN
INTRAMUSCULAR | Status: DC | PRN
  Administered 2022-03-24: 2 mg via INTRAVENOUS

## 2022-03-24 MED ORDER — CHLORHEXIDINE GLUCONATE CLOTH 2 % EX PADS: 6.0000 | MEDICATED_PAD | Freq: Once | CUTANEOUS | Status: DC

## 2022-03-24 MED ORDER — ONDANSETRON HCL 4 MG/2ML IJ SOLN
INTRAMUSCULAR | Status: DC | PRN
  Administered 2022-03-24: 4 mg via INTRAVENOUS

## 2022-03-24 MED ORDER — LIDOCAINE 2% (20 MG/ML) 5 ML SYRINGE
INTRAMUSCULAR | Status: AC
  Filled 2022-03-24: qty 5

## 2022-03-24 MED ORDER — CEFAZOLIN SODIUM-DEXTROSE 2-4 GM/100ML-% IV SOLN
INTRAVENOUS | Status: AC
  Filled 2022-03-24: qty 100

## 2022-03-24 MED ORDER — DEXMEDETOMIDINE HCL IN NACL 80 MCG/20ML IV SOLN
INTRAVENOUS | Status: AC
  Filled 2022-03-24: qty 20

## 2022-03-24 MED ORDER — IBUPROFEN 800 MG PO TABS
800.0000 mg | ORAL_TABLET | Freq: Three times a day (TID) | ORAL | 0 refills | Status: DC | PRN
  Filled 2022-03-24: qty 30, 10d supply, fill #0

## 2022-03-24 MED ORDER — LACTATED RINGERS IV SOLN: INTRAVENOUS | Status: DC

## 2022-03-24 MED ORDER — KETOROLAC TROMETHAMINE 30 MG/ML IJ SOLN
30.0000 mg | Freq: Once | INTRAMUSCULAR | Status: AC | PRN
  Administered 2022-03-24: 30 mg via INTRAVENOUS

## 2022-03-24 MED ORDER — ONDANSETRON HCL 4 MG/2ML IJ SOLN
INTRAMUSCULAR | Status: AC
  Filled 2022-03-24: qty 2

## 2022-03-24 MED ORDER — KETOROLAC TROMETHAMINE 30 MG/ML IJ SOLN
INTRAMUSCULAR | Status: AC
  Filled 2022-03-24: qty 1

## 2022-03-24 MED ORDER — LIDOCAINE HCL (CARDIAC) PF 100 MG/5ML IV SOSY
PREFILLED_SYRINGE | INTRAVENOUS | Status: DC | PRN
  Administered 2022-03-24: 100 mg via INTRAVENOUS

## 2022-03-24 MED ORDER — 0.9 % SODIUM CHLORIDE (POUR BTL) OPTIME
TOPICAL | Status: DC | PRN
  Administered 2022-03-24: 600 mL

## 2022-03-24 MED ORDER — OXYCODONE HCL 5 MG PO TABS
5.0000 mg | ORAL_TABLET | Freq: Four times a day (QID) | ORAL | 0 refills | Status: DC | PRN
  Filled 2022-03-24: qty 15, 4d supply, fill #0

## 2022-03-24 MED ORDER — BUPIVACAINE-EPINEPHRINE (PF) 0.25% -1:200000 IJ SOLN
INTRAMUSCULAR | Status: AC
  Filled 2022-03-24: qty 120

## 2022-03-24 MED ORDER — EPHEDRINE 5 MG/ML INJ
INTRAVENOUS | Status: AC
  Filled 2022-03-24: qty 5

## 2022-03-24 MED ORDER — FENTANYL CITRATE (PF) 100 MCG/2ML IJ SOLN
INTRAMUSCULAR | Status: DC | PRN
  Administered 2022-03-24 (×2): 50 ug via INTRAVENOUS

## 2022-03-24 MED ORDER — MIDAZOLAM HCL 2 MG/2ML IJ SOLN
INTRAMUSCULAR | Status: AC
  Filled 2022-03-24: qty 2

## 2022-03-24 MED ORDER — OXYCODONE HCL 5 MG/5ML PO SOLN: 5.0000 mg | Freq: Once | ORAL | Status: DC | PRN

## 2022-03-24 MED ORDER — BUPIVACAINE-EPINEPHRINE 0.25% -1:200000 IJ SOLN
INTRAMUSCULAR | Status: DC | PRN
  Administered 2022-03-24: 20 mL

## 2022-03-24 MED ORDER — DEXMEDETOMIDINE HCL IN NACL 80 MCG/20ML IV SOLN
INTRAVENOUS | Status: DC | PRN
  Administered 2022-03-24: 8 ug via BUCCAL

## 2022-03-24 MED ORDER — ONDANSETRON HCL 4 MG/2ML IJ SOLN: 4.0000 mg | Freq: Once | INTRAMUSCULAR | Status: DC | PRN

## 2022-03-24 MED ORDER — PROPOFOL 10 MG/ML IV BOLUS
INTRAVENOUS | Status: DC | PRN
  Administered 2022-03-24: 200 mg via INTRAVENOUS

## 2022-03-24 MED ORDER — DEXAMETHASONE SODIUM PHOSPHATE 10 MG/ML IJ SOLN
INTRAMUSCULAR | Status: AC
  Filled 2022-03-24: qty 1

## 2022-03-24 SURGICAL SUPPLY — 47 items
APL PRP STRL LF DISP 70% ISPRP (MISCELLANEOUS) ×1
APL SKNCLS STERI-STRIP NONHPOA (GAUZE/BANDAGES/DRESSINGS)
BENZOIN TINCTURE PRP APPL 2/3 (GAUZE/BANDAGES/DRESSINGS) IMPLANT
BLADE SURG 10 STRL SS (BLADE) ×1 IMPLANT
BLADE SURG 15 STRL LF DISP TIS (BLADE) ×1 IMPLANT
BLADE SURG 15 STRL SS (BLADE) ×1
CANISTER SUCT 1200ML W/VALVE (MISCELLANEOUS) IMPLANT
CHLORAPREP W/TINT 26 (MISCELLANEOUS) ×1 IMPLANT
COVER BACK TABLE 60X90IN (DRAPES) ×1 IMPLANT
COVER MAYO STAND STRL (DRAPES) ×1 IMPLANT
DRAPE LAPAROTOMY 100X72 PEDS (DRAPES) ×1 IMPLANT
DRAPE UTILITY XL STRL (DRAPES) ×1 IMPLANT
ELECT COATED BLADE 2.86 ST (ELECTRODE) ×1 IMPLANT
ELECT REM PT RETURN 9FT ADLT (ELECTROSURGICAL) ×1
ELECTRODE REM PT RTRN 9FT ADLT (ELECTROSURGICAL) ×1 IMPLANT
GAUZE SPONGE 4X4 12PLY STRL LF (GAUZE/BANDAGES/DRESSINGS) IMPLANT
GAUZE XEROFORM 1X8 LF (GAUZE/BANDAGES/DRESSINGS) IMPLANT
GLOVE BIO SURGEON STRL SZ 6.5 (GLOVE) IMPLANT
GLOVE BIOGEL PI IND STRL 6.5 (GLOVE) IMPLANT
GLOVE BIOGEL PI IND STRL 8 (GLOVE) ×1 IMPLANT
GLOVE ECLIPSE 8.0 STRL XLNG CF (GLOVE) ×1 IMPLANT
GLOVE SS BIOGEL STRL SZ 6.5 (GLOVE) IMPLANT
GOWN STRL REUS W/ TWL LRG LVL3 (GOWN DISPOSABLE) ×2 IMPLANT
GOWN STRL REUS W/ TWL XL LVL3 (GOWN DISPOSABLE) ×1 IMPLANT
GOWN STRL REUS W/TWL LRG LVL3 (GOWN DISPOSABLE) ×2
GOWN STRL REUS W/TWL XL LVL3 (GOWN DISPOSABLE) ×1
NDL HYPO 25X1 1.5 SAFETY (NEEDLE) ×1 IMPLANT
NEEDLE HYPO 25X1 1.5 SAFETY (NEEDLE) ×1 IMPLANT
NS IRRIG 1000ML POUR BTL (IV SOLUTION) IMPLANT
PACK BASIN DAY SURGERY FS (CUSTOM PROCEDURE TRAY) ×1 IMPLANT
PENCIL SMOKE EVACUATOR (MISCELLANEOUS) ×1 IMPLANT
SLEEVE SCD COMPRESS KNEE MED (STOCKING) ×1 IMPLANT
SPONGE T-LAP 4X18 ~~LOC~~+RFID (SPONGE) ×1 IMPLANT
STRIP CLOSURE SKIN 1/2X4 (GAUZE/BANDAGES/DRESSINGS) IMPLANT
SUT ETHILON 2 0 FS 18 (SUTURE) IMPLANT
SUT MON AB 4-0 PC3 18 (SUTURE) ×1 IMPLANT
SUT VIC AB 2-0 CT1 18 (SUTURE) IMPLANT
SUT VIC AB 2-0 SH 27 (SUTURE)
SUT VIC AB 2-0 SH 27XBRD (SUTURE) IMPLANT
SUT VICRYL 3-0 CR8 SH (SUTURE) ×1 IMPLANT
SUT VICRYL AB 3 0 TIES (SUTURE) IMPLANT
SYR BULB EAR ULCER 3OZ GRN STR (SYRINGE) ×1 IMPLANT
SYR CONTROL 10ML LL (SYRINGE) ×1 IMPLANT
TOWEL GREEN STERILE FF (TOWEL DISPOSABLE) ×2 IMPLANT
TUBE CONNECTING 20X1/4 (TUBING) IMPLANT
UNDERPAD 30X36 HEAVY ABSORB (UNDERPADS AND DIAPERS) IMPLANT
YANKAUER SUCT BULB TIP NO VENT (SUCTIONS) IMPLANT

## 2022-03-24 NOTE — Discharge Instructions (Addendum)
#######################################################  GENERAL SURGERY: POST OP INSTRUCTIONS  ######################################################################  EAT Gradually transition to a high fiber diet with a fiber supplement over the next few weeks after discharge.  Start with a pureed / full liquid diet (see below)  WALK Walk an hour a day.  Control your pain to do that.    CONTROL PAIN Control pain so that you can walk, sleep, tolerate sneezing/coughing, go up/down stairs.  HAVE A BOWEL MOVEMENT DAILY Keep your bowels regular to avoid problems.  OK to try a laxative to override constipation.  OK to use an antidairrheal to slow down diarrhea.  Call if not better after 2 tries  CALL IF YOU HAVE PROBLEMS/CONCERNS Call if you are still struggling despite following these instructions. Call if you have concerns not answered by these instructions  ######################################################################    DIET: Follow a light bland diet & liquids the first 24 hours after arrival home, such as soup, liquids, starches, etc.  Be sure to drink plenty of fluids.  Quickly advance to a usual solid diet within a few days.  Avoid fast food or heavy meals as your are more likely to get nauseated or have irregular bowels.  A low-fat, high-fiber diet for the rest of your life is ideal.    Take your usually prescribed home medications unless otherwise directed.  PAIN CONTROL: Pain is best controlled by a usual combination of three different methods TOGETHER: Ice/Heat Over the counter pain medication Prescription pain medication Most patients will experience some swelling and bruising around the incisions.  Ice packs or heating pads (30-60 minutes up to 6 times a day) will help. Use ice for the first few days to help decrease swelling and bruising, then switch to heat to help relax tight/sore spots and speed recovery.  Some people prefer to use ice alone, heat alone,  alternating between ice & heat.  Experiment to what works for you.  Swelling and bruising can take several weeks to resolve.   It is helpful to take an over-the-counter pain medication regularly for the first few weeks.  Choose one of the following that works best for you: Naproxen (Aleve, etc)  Two 220mg tabs twice a day Ibuprofen (Advil, etc) Three 200mg tabs four times a day (every meal & bedtime) Acetaminophen (Tylenol, etc) 500-650mg four times a day (every meal & bedtime) A  prescription for pain medication (such as oxycodone, hydrocodone, etc) should be given to you upon discharge.  Take your pain medication as prescribed.  If you are having problems/concerns with the prescription medicine (does not control pain, nausea, vomiting, rash, itching, etc), please call us [phone] to see if we need to switch you to a different pain medicine that will work better for you and/or control your side effect better. If you need a refill on your pain medication, please contact your pharmacy.  They will contact our office to request authorization. Prescriptions will not be filled after 5 pm or on week-ends.  Avoid getting constipated.  Between the surgery and the pain medications, it is common to experience some constipation.  Increasing fluid intake and taking a fiber supplement (such as Metamucil, Citrucel, FiberCon, MiraLax, etc) 1-2 times a day regularly will usually help prevent this problem from occurring.  A mild laxative (prune juice, Milk of Magnesia, MiraLax, etc) should be taken according to package directions if there are no bowel movements after 48 hours.   Watch out for diarrhea.  If you have many loose bowel movements, simplify your   diet to bland foods & liquids for a few days.  Stop any stool softeners and decrease your fiber supplement.  Switching to mild anti-diarrheal medications (Loperamide/Imodium, Kayopectate, Pepto Bismol) can help.  If this worsens or does not improve, please call  us.  Wash / shower every day.  You may shower over the dressings as they are waterproof.  Continue to shower over incision(s) after the dressing is off. Remove your waterproof bandages 5 days after surgery.  You may leave the incision open to air.  You may have skin tapes (Steri Strips) covering the incision(s).  Leave them on until one week, then remove.  You may replace a dressing/Band-Aid to cover the incision for comfort if you wish.   ACTIVITIES as tolerated:   You may resume regular (light) daily activities beginning the next day--such as daily self-care, walking, climbing stairs--gradually increasing activities as tolerated.  If you can walk 30 minutes without difficulty, it is safe to try more intense activity such as jogging, treadmill, bicycling, low-impact aerobics, swimming, etc. Save the most intensive and strenuous activity for last such as sit-ups, heavy lifting, contact sports, etc  Refrain from any heavy lifting or straining until you are off narcotics for pain control.   DO NOT PUSH THROUGH PAIN.  Let pain be your guide: If it hurts to do something, don't do it.  Pain is your body warning you to avoid that activity for another week until the pain goes down. You may drive when you are no longer taking prescription pain medication, you can comfortably wear a seatbelt, and you can safely maneuver your car and apply brakes. You may have sexual intercourse when it is comfortable.   FOLLOW UP in our office Please call [facility] at [phone] to set up an appointment to see your surgeon in the office for a follow-up appointment approximately 2-3 weeks after your surgery. Make sure that you call for this appointment the day you arrive home to insure a convenient appointment time.  9. IF YOU HAVE DISABILITY OR FAMILY LEAVE FORMS, BRING THEM TO THE OFFICE FOR PROCESSING.  DO NOT GIVE THEM TO YOUR DOCTOR.   WHEN TO CALL us [phone]: Poor pain control Reactions / problems with new  medications (rash/itching, nausea, etc)  Fever over 101.5 F (38.5 C) Worsening swelling or bruising Continued bleeding from incision. Increased pain, redness, or drainage from the incision Difficulty breathing / swallowing   The clinic staff is available to answer your questions during regular business hours (8:30am-5pm).  Please don't hesitate to call and ask to speak to one of our nurses for clinical concerns.   If you have a medical emergency, go to the nearest emergency room or call 911.  A surgeon from [facility] is always on call at the [facility], [place], [place], [place], [place]  [address] ? MAIN: [phone] ? TOLL FREE: [phone] ?  FAX [phone] [email or web address]  #######################################################       CHANGE DRESSING DAILY WITH DRY GAUZE APPLY NEOSPORIN DAILY  SHOWER TOMORROW  EXPECT SOME DRAINAGE FROM THE INCISION AND SHOULD BE LIGHT    Post Anesthesia Home Care Instructions  Activity: Get plenty of rest for the remainder of the day. A responsible individual must stay with you for 24 hours following the procedure.  For the next 24 hours, DO NOT: -Drive a car -Paediatric nurse -Drink alcoholic beverages -Take any medication unless instructed by your physician -Make  any legal decisions or sign important papers.  Meals: Start with liquid foods such as gelatin or soup. Progress to regular foods as tolerated. Avoid greasy, spicy, heavy foods. If nausea and/or vomiting occur, drink only clear liquids until the nausea and/or vomiting subsides. Call your physician if vomiting continues.  Special Instructions/Symptoms: Your throat may feel dry or sore from the anesthesia or the breathing tube placed in your throat during surgery. If this causes discomfort, gargle with warm salt water. The discomfort should disappear within 24 hours.  If you had a scopolamine  patch placed behind your ear for the management of post- operative nausea and/or vomiting:  1. The medication in the patch is effective for 72 hours, after which it should be removed.  Wrap patch in a tissue and discard in the trash. Wash hands thoroughly with soap and water. 2. You may remove the patch earlier than 72 hours if you experience unpleasant side effects which may include dry mouth, dizziness or visual disturbances. 3. Avoid touching the patch. Wash your hands with soap and water after contact with the patch.    *May have Tylenol at 1:10pm today 03/24/22  *May have Ibuprofen at 3pm today

## 2022-03-24 NOTE — Anesthesia Procedure Notes (Signed)
Procedure Name: LMA Insertion Date/Time: 03/24/2022 7:53 AM  Performed by: Tawni Millers, CRNAPre-anesthesia Checklist: Patient identified, Emergency Drugs available, Suction available and Patient being monitored Patient Re-evaluated:Patient Re-evaluated prior to induction Oxygen Delivery Method: Circle system utilized Preoxygenation: Pre-oxygenation with 100% oxygen Induction Type: IV induction Ventilation: Mask ventilation without difficulty LMA: LMA inserted LMA Size: 4.0 Number of attempts: 1 Airway Equipment and Method: Bite block Placement Confirmation: positive ETCO2 Tube secured with: Tape Dental Injury: Teeth and Oropharynx as per pre-operative assessment

## 2022-03-24 NOTE — Interval H&P Note (Signed)
History and Physical Interval Note:  03/24/2022 7:14 AM  Cathy Ho  has presented today for surgery, with the diagnosis of SEBACEOUS CYST.  The various methods of treatment have been discussed with the patient and family. After consideration of risks, benefits and other options for treatment, the patient has consented to  Procedure(s): EXCISION OF CHEST SEBACEAUS CYST (N/A) as a surgical intervention.  The patient's history has been reviewed, patient examined, no change in status, stable for surgery.  I have reviewed the patient's chart and labs.  Questions were answered to the patient's satisfaction.   The procedure has been discussed with the patient.  Alternative therapies have been discussed with the patient.  Operative risks include bleeding,  Infection,  Organ injury,  Nerve injury,  Blood vessel injury,  DVT,  Pulmonary embolism,  Death,  And possible reoperation.  Medical management risks include worsening of present situation.  The success of the procedure is 50 -90 % at treating patients symptoms.  The patient understands and agrees to proceed.   Turner Daniels MD

## 2022-03-24 NOTE — H&P (Signed)
History of Present Illness: Cathy Ho is a 41 y.o. female who is seen today as an office consultation for evaluation of Breast Problem .  Patient sent for evaluation of a sebaceous cyst in the midline of her chest and between her breast. She is scheduled for bilateral breast reduction and this cyst has been present before. Is gotten larger and had episode of drainage but currently is asymptomatic. Plastic surgery asked cyst be removed prior to reduction.  Review of Systems: A complete review of systems was obtained from the patient. I have reviewed this information and discussed as appropriate with the patient. See HPI as well for other ROS.    Medical History: Past Medical History: Diagnosis Date Anemia Diabetes mellitus without complication (CMS-HCC) Hypertension Sleep apnea  There is no problem list on file for this patient.  Past Surgical History: Procedure Laterality Date cyst removed from back and chest area   No Known Allergies  Current Outpatient Medications on File Prior to Visit Medication Sig Dispense Refill ferrous sulfate 325 (65 FE) MG tablet Take by mouth lisinopriL (ZESTRIL) 20 MG tablet Take 20 mg by mouth once daily MOUNJARO 10 mg/0.5 mL PnIj VITAMIN D2 1,250 mcg (50,000 unit) capsule Take 50,000 Units by mouth every 7 (seven) days XIGDUO XR 5-1,000 mg XR 24 hr bipahsic tablet  No current facility-administered medications on file prior to visit.  No family history on file.  Social History  Tobacco Use Smoking Status Never Smokeless Tobacco Never   Social History  Socioeconomic History Marital status: Single Tobacco Use Smoking status: Never Smokeless tobacco: Never  Objective:  Vitals: 02/21/22 0915 BP: 128/88 Pulse: 83 Weight: (!) 112 kg (247 lb) Height: 162.6 cm ('5\' 4"'$ )  Body mass index is 42.4 kg/m.  Physical Exam HENT: Head: Normocephalic. Cardiovascular: Rate and Rhythm: Normal rate. Pulmonary: Effort:  Pulmonary effort is normal. Breath sounds: No stridor. Chest: Breasts: Right: Normal. Left: Normal.  Comments: 5 cm sebaceous cyst No infection Macromastia B Musculoskeletal: Cervical back: Normal range of motion. Skin: General: Skin is warm. Neurological: General: No focal deficit present. Mental Status: She is alert.    Assessment and Plan:  Diagnoses and all orders for this visit:  Sebaceous cyst of skin of breast, unspecified laterality    Schedule patient for excision of sebaceous cyst chest  Risk of bleeding, infection, wound complication, poor wound healing, anesthesia risk, cardiovascular event, and the need for the treatment center procedures discussed with the patient.  No follow-ups on file.  Kennieth Francois, MD

## 2022-03-24 NOTE — Op Note (Signed)
Preoperative diagnosis: Complex sebaceous cyst chest area measures 7 x 6 cm subcutaneous  Postoperative diagnosis: Same  Procedure: Excision of complex epidermal inclusion cyst chest  Surgeon: Erroll Luna, MD  Anesthesia: LMA with 0.25% Marcaine with epinephrine  EBL: Minimal  Specimen: Skin and complex cyst with multiple subcentimeter adjacent cysts subcutaneous to pathology  Drains: None  Indications for procedure: The patient is a 41 year old female who had a chronic midline epidermal inclusion cyst that has been drained.  It keeps recurring and there are a couple of smaller cyst around it.  She presents for excision of this area for definitive treatment of a chronic epidermal inclusion cyst.  She has a history of multiple infections as well.  Risks and benefits were discussed.  Wound complications reviewed.  She does have keloid we discussed the significance of this and potential scarring issues afterwards.  We discussed ways to reduce this.  We discussed the fact that despite these efforts she may have significant keloiding.  Risk of bleeding, infection, wound complications, revisional surgery and the need for the treatments discussed.  She agreed to proceed.  Description of procedure: The patient was met in the holding area.  The site was marked.  She was then taken back to the operating room and placed upon upon the OR table.  After induction of general anesthesia, the chest was prepped and draped in sterile fashion and timeout performed.  Proper patient, site and procedure verified.  The cystic area was in the midline along the inframammary fold.  Extended both to the patient's left and slightly to the right.  There were multiple small cysts around the main cyst and the entire area measures 7 x 6 cm.  This appeared to also extend into the subcutaneous space.  An elliptical incision was made.  This was made in a transverse fashion to encompass all diseases a vertical incision would not  easily encompass the area.  This extended well into the subcutaneous fat and there were multiple subcentimeter cysts adjacent to that they were all excised as 1 specimen.  This appeared to be chronic.  He was excised down into the subcutaneous fat.  This was then irrigated out.  Hemostasis was achieved with cautery.  Due to location and breast size, I did undermine the skin to help facilitate a transverse closure since this would not closed in the midline in a vertical fashion very easily.  The disease did extend toward both inferior mammary folds which made the vertical incision less suitable for the orientation of disease.  Once the subcutaneous tissues were mobilized I placed a deep layer of 2-0 Vicryl to help approximate the dead space.  2-0 nylon was used to close the skin to provide ample tension on the closure and support the closure.  Xeroform placed over the incision.  Dry dressings applied.  All counts were found to be correct.  EBL minimal.  Patient awoke extubated taken to recovery in satisfactory condition.

## 2022-03-24 NOTE — Transfer of Care (Signed)
Immediate Anesthesia Transfer of Care Note  Patient: Cathy Ho  Procedure(s) Performed: EXCISION OF CHEST SEBACEAUS CYST (Chest)  Patient Location: PACU  Anesthesia Type:General  Level of Consciousness: awake  Airway & Oxygen Therapy: Patient Spontanous Breathing and Patient connected to face mask oxygen  Post-op Assessment: Report given to RN and Post -op Vital signs reviewed and stable  Post vital signs: Reviewed and stable  Last Vitals:  Vitals Value Taken Time  BP    Temp    Pulse 107 03/24/22 0835  Resp 25 03/24/22 0835  SpO2 100 % 03/24/22 0835    Last Pain:  Vitals:   03/24/22 0629  TempSrc: Oral  PainSc: 0-No pain      Patients Stated Pain Goal: 3 (88/89/16 9450)  Complications: No notable events documented.

## 2022-03-24 NOTE — Anesthesia Postprocedure Evaluation (Signed)
Anesthesia Post Note  Patient: Cathy Ho  Procedure(s) Performed: EXCISION OF CHEST SEBACEAUS CYST (Chest)     Patient location during evaluation: PACU Anesthesia Type: General Level of consciousness: awake and alert Pain management: pain level controlled Vital Signs Assessment: post-procedure vital signs reviewed and stable Respiratory status: spontaneous breathing, nonlabored ventilation, respiratory function stable and patient connected to nasal cannula oxygen Cardiovascular status: blood pressure returned to baseline and stable Postop Assessment: no apparent nausea or vomiting Anesthetic complications: no  No notable events documented.  Last Vitals:  Vitals:   03/24/22 0900 03/24/22 0923  BP: 122/83 114/79  Pulse: 75 76  Resp: 11 12  Temp:  36.6 C  SpO2: 100% 100%    Last Pain:  Vitals:   03/24/22 0923  TempSrc:   PainSc: [name]

## 2022-03-25 ENCOUNTER — Encounter (HOSPITAL_BASED_OUTPATIENT_CLINIC_OR_DEPARTMENT_OTHER): Payer: Self-pay | Admitting: Surgery

## 2022-03-25 LAB — SURGICAL PATHOLOGY

## 2022-03-28 ENCOUNTER — Encounter: Payer: Self-pay | Admitting: Surgery

## 2022-03-31 ENCOUNTER — Other Ambulatory Visit (HOSPITAL_COMMUNITY): Payer: Self-pay

## 2022-03-31 MED ORDER — MOUNJARO 12.5 MG/0.5ML ~~LOC~~ SOAJ
12.5000 mg | SUBCUTANEOUS | 0 refills | Status: DC
  Filled 2022-03-31: qty 2, 28d supply, fill #0
  Filled 2022-04-23 – 2022-04-26 (×5): qty 2, 28d supply, fill #1
  Filled 2022-05-27: qty 2, 28d supply, fill #2

## 2022-04-08 ENCOUNTER — Institutional Professional Consult (permissible substitution): Payer: No Typology Code available for payment source | Admitting: Plastic Surgery

## 2022-04-26 ENCOUNTER — Other Ambulatory Visit (HOSPITAL_COMMUNITY): Payer: Self-pay

## 2022-04-27 ENCOUNTER — Other Ambulatory Visit (HOSPITAL_COMMUNITY): Payer: Self-pay

## 2022-05-05 IMAGING — MG DIGITAL DIAGNOSTIC BILAT W/ TOMO W/ CAD
6 of 12 series · 6 of 36 positions shown · non-contrast
Comparison: Previous exam(s).

ACR Breast Density Category a: The breast tissue is almost entirely
fatty.

CLINICAL DATA: Crusty, cracked right nipple that began 9 months ago
and lasted 6 months. This resolved after she was given a
prescription for an antifungal cream to use on the nipple. No
current breast problems. She has multiple large sebaceous cysts at
various locations on her body, including at the medial aspects of
both breasts.

EXAM:
DIGITAL DIAGNOSTIC BILATERAL MAMMOGRAM WITH TOMO AND CAD

[L CC synth-2D]
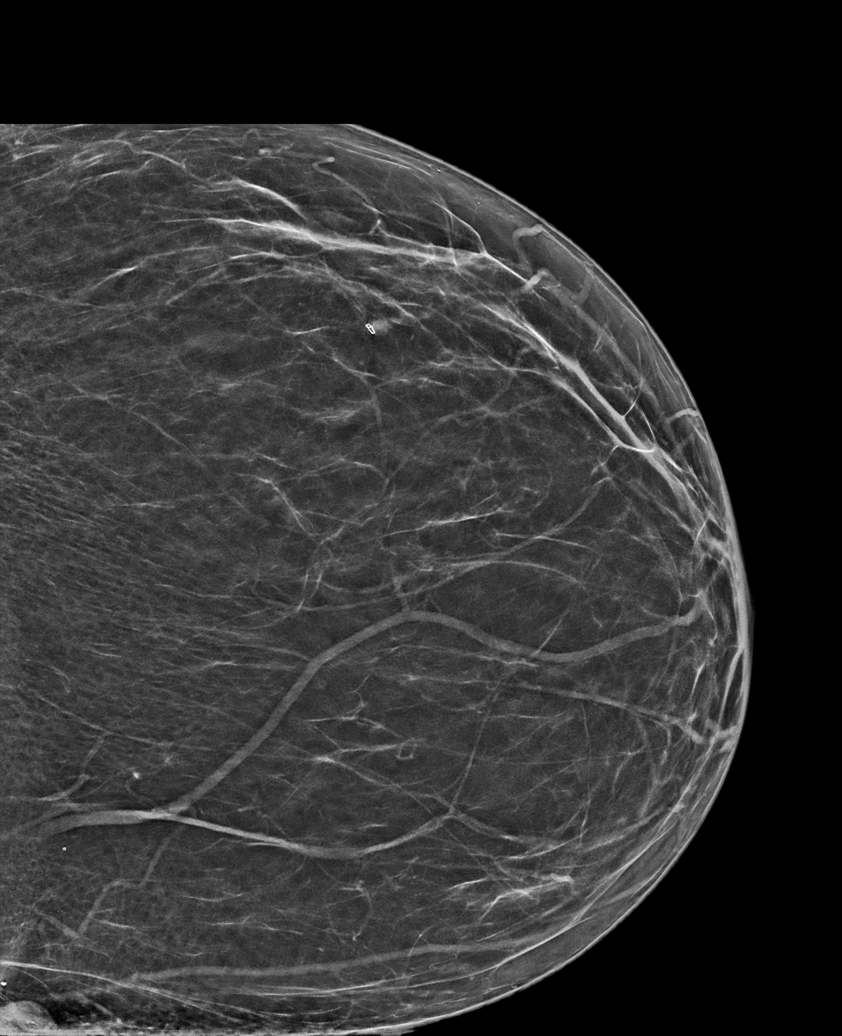

[R MLO synth-2D]
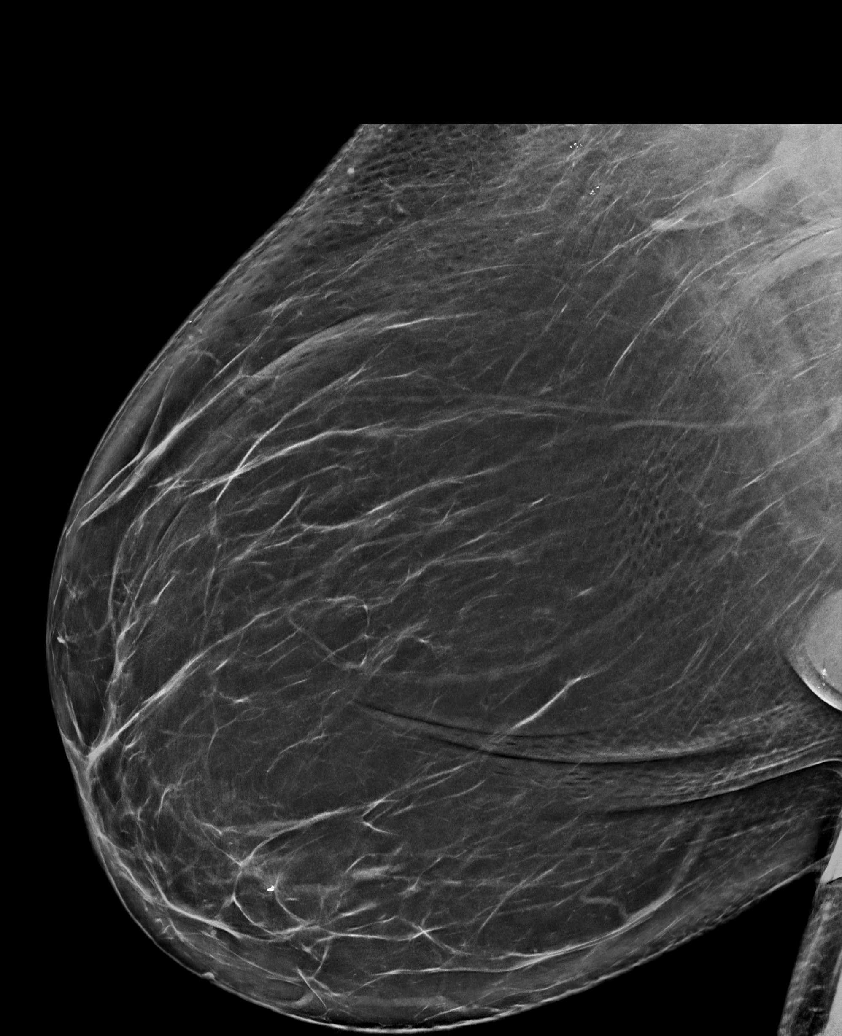

[R CV synth-2D]
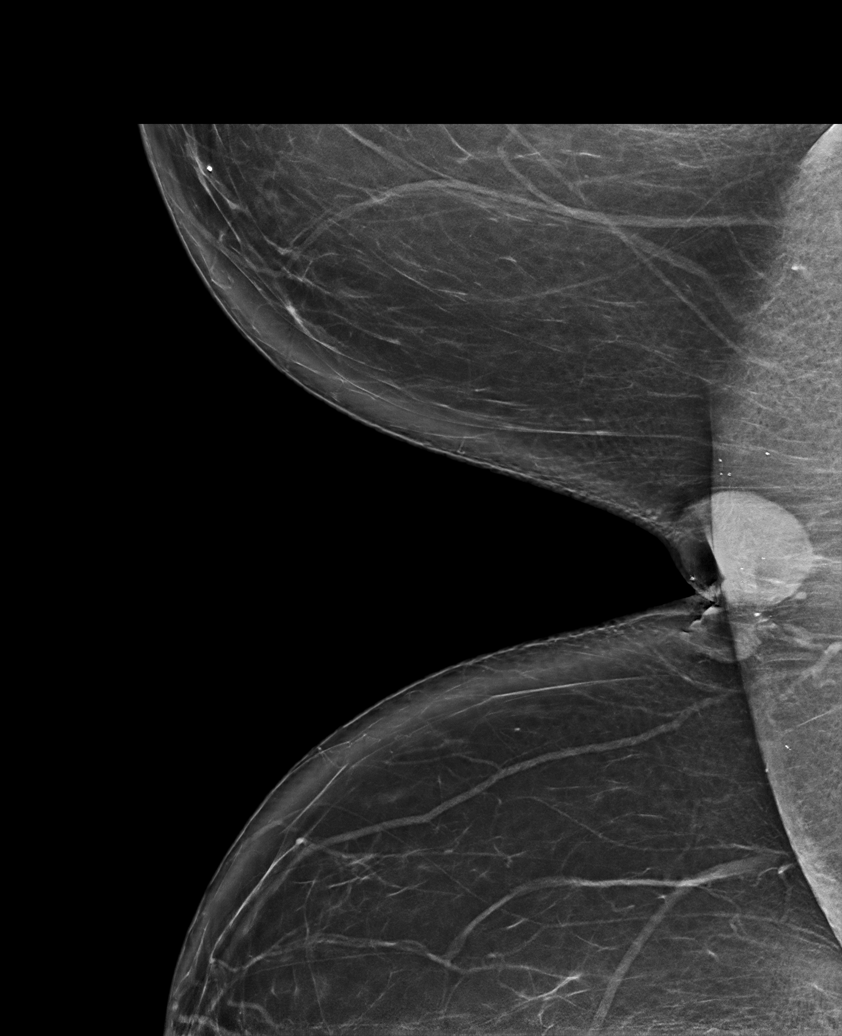

[R CC synth-2D]
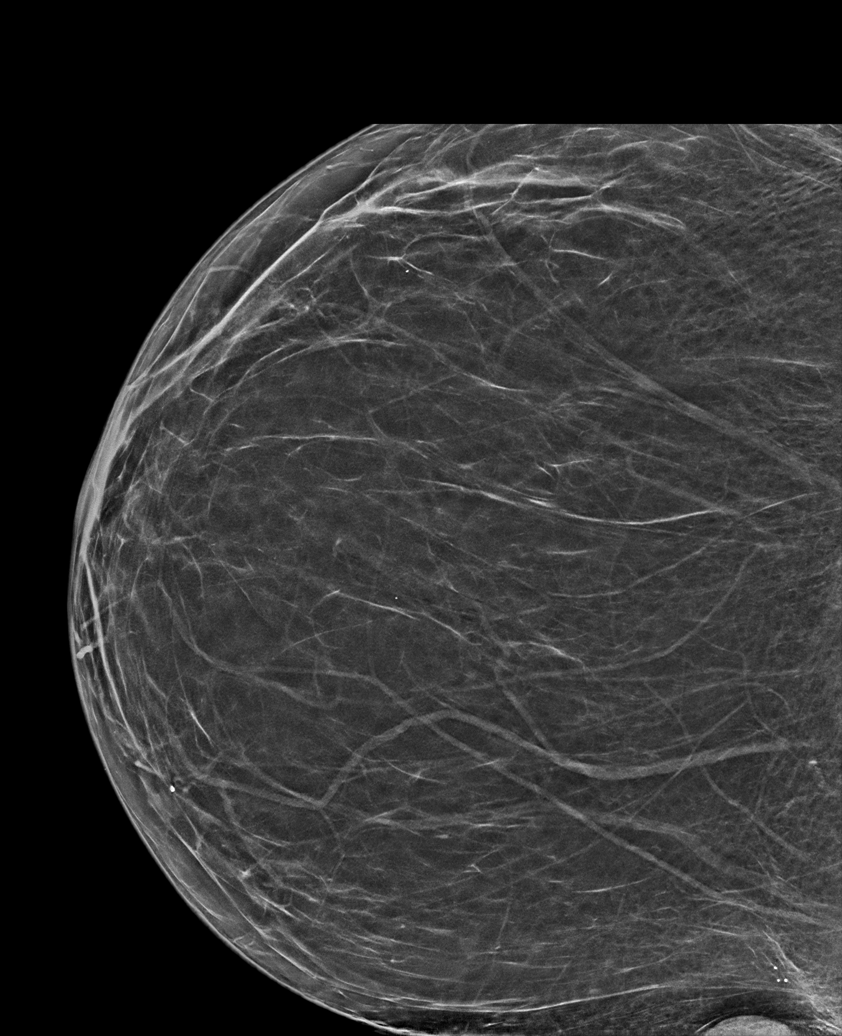

[L MLO synth-2D (1 of 2)]
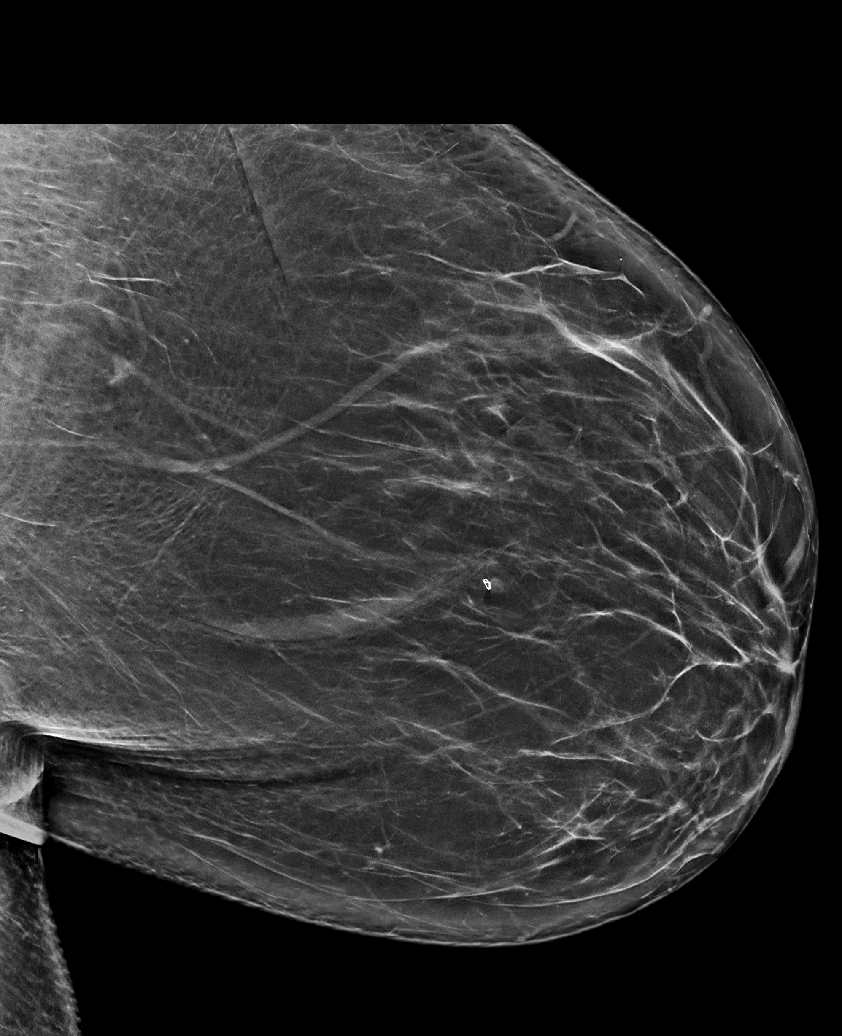

[L MLO synth-2D (2 of 2)]
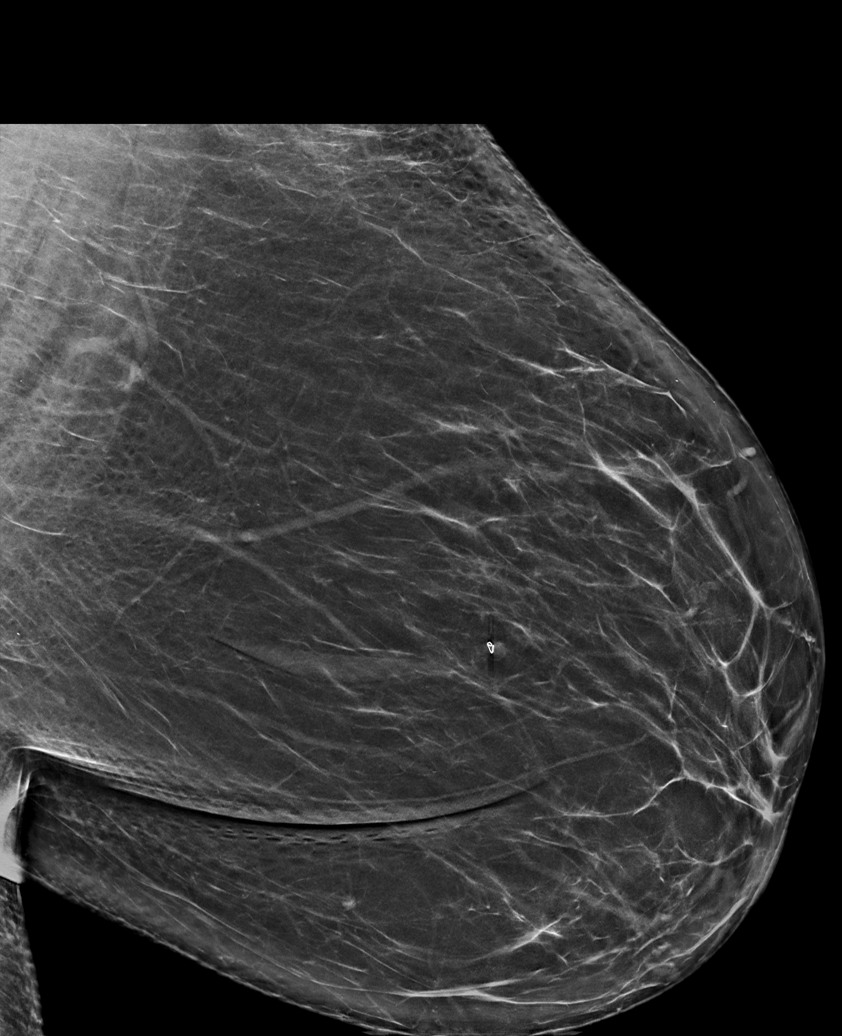

[6 of 36 positions shown; findings below may reference images not displayed]

FINDINGS: Stable mammographic appearance of the breasts with no findings
suspicious for malignancy in either breast.

Physical examination today, right nipple and areola have normal
appearances, symmetrical compared to the left. There are no skin
abnormalities or palpable masses.

Mammographic images were processed with CAD.
IMPRESSION: No evidence of malignancy.

RECOMMENDATION:
Bilateral screening mammogram in 1 year.

I have discussed the findings and recommendations with the patient.
If applicable, a reminder letter will be sent to the patient
regarding the next appointment.

BI-RADS CATEGORY  1: Negative.

## 2022-05-10 ENCOUNTER — Other Ambulatory Visit (HOSPITAL_COMMUNITY): Payer: Self-pay

## 2022-05-12 ENCOUNTER — Other Ambulatory Visit (HOSPITAL_COMMUNITY): Payer: Self-pay

## 2022-05-16 ENCOUNTER — Other Ambulatory Visit (HOSPITAL_COMMUNITY): Payer: Self-pay

## 2022-05-17 ENCOUNTER — Other Ambulatory Visit: Payer: Self-pay

## 2022-05-17 ENCOUNTER — Other Ambulatory Visit (HOSPITAL_COMMUNITY): Payer: Self-pay

## 2022-05-27 ENCOUNTER — Other Ambulatory Visit: Payer: Self-pay

## 2022-05-27 ENCOUNTER — Other Ambulatory Visit (HOSPITAL_COMMUNITY): Payer: Self-pay

## 2022-05-30 ENCOUNTER — Other Ambulatory Visit (HOSPITAL_COMMUNITY): Payer: Self-pay

## 2022-05-31 ENCOUNTER — Other Ambulatory Visit (HOSPITAL_COMMUNITY): Payer: Self-pay

## 2022-06-17 DIAGNOSIS — Z9889 Other specified postprocedural states: Secondary | ICD-10-CM | POA: Diagnosis not present

## 2022-07-07 ENCOUNTER — Other Ambulatory Visit (HOSPITAL_COMMUNITY): Payer: Self-pay

## 2022-07-08 ENCOUNTER — Other Ambulatory Visit (HOSPITAL_COMMUNITY): Payer: Self-pay

## 2022-07-08 MED ORDER — MOUNJARO 12.5 MG/0.5ML ~~LOC~~ SOAJ
12.5000 mg | SUBCUTANEOUS | 0 refills | Status: DC
  Filled 2022-07-08: qty 6, 84d supply, fill #0

## 2022-07-13 ENCOUNTER — Other Ambulatory Visit (HOSPITAL_COMMUNITY): Payer: Self-pay

## 2022-07-13 DIAGNOSIS — Z111 Encounter for screening for respiratory tuberculosis: Secondary | ICD-10-CM | POA: Diagnosis not present

## 2022-09-01 DIAGNOSIS — H5213 Myopia, bilateral: Secondary | ICD-10-CM | POA: Diagnosis not present

## 2022-09-20 ENCOUNTER — Other Ambulatory Visit (HOSPITAL_COMMUNITY): Payer: Self-pay

## 2022-09-21 ENCOUNTER — Other Ambulatory Visit (HOSPITAL_COMMUNITY): Payer: Self-pay

## 2022-09-21 ENCOUNTER — Other Ambulatory Visit: Payer: Self-pay

## 2022-09-22 ENCOUNTER — Other Ambulatory Visit (HOSPITAL_COMMUNITY): Payer: Self-pay

## 2022-09-23 DIAGNOSIS — E559 Vitamin D deficiency, unspecified: Secondary | ICD-10-CM | POA: Diagnosis not present

## 2022-09-23 DIAGNOSIS — I10 Essential (primary) hypertension: Secondary | ICD-10-CM | POA: Diagnosis not present

## 2022-09-23 DIAGNOSIS — R946 Abnormal results of thyroid function studies: Secondary | ICD-10-CM | POA: Diagnosis not present

## 2022-09-23 DIAGNOSIS — E119 Type 2 diabetes mellitus without complications: Secondary | ICD-10-CM | POA: Diagnosis not present

## 2022-09-23 DIAGNOSIS — Z79899 Other long term (current) drug therapy: Secondary | ICD-10-CM | POA: Diagnosis not present

## 2022-10-12 ENCOUNTER — Other Ambulatory Visit (HOSPITAL_COMMUNITY): Payer: Self-pay

## 2022-10-12 DIAGNOSIS — Z0000 Encounter for general adult medical examination without abnormal findings: Secondary | ICD-10-CM | POA: Diagnosis not present

## 2022-10-12 DIAGNOSIS — I10 Essential (primary) hypertension: Secondary | ICD-10-CM | POA: Diagnosis not present

## 2022-10-12 DIAGNOSIS — E119 Type 2 diabetes mellitus without complications: Secondary | ICD-10-CM | POA: Diagnosis not present

## 2022-10-12 MED ORDER — MOUNJARO 15 MG/0.5ML ~~LOC~~ SOAJ
15.0000 mg | SUBCUTANEOUS | 1 refills | Status: DC
  Filled 2022-10-12: qty 2, 28d supply, fill #0
  Filled 2022-11-17: qty 2, 28d supply, fill #1
  Filled 2022-12-15: qty 2, 28d supply, fill #2
  Filled 2023-01-12: qty 2, 28d supply, fill #3
  Filled 2023-02-04: qty 2, 28d supply, fill #4
  Filled 2023-03-04: qty 2, 28d supply, fill #5

## 2022-10-13 ENCOUNTER — Other Ambulatory Visit (HOSPITAL_COMMUNITY): Payer: Self-pay

## 2022-10-14 ENCOUNTER — Ambulatory Visit: Payer: 59 | Admitting: Obstetrics & Gynecology

## 2022-10-25 ENCOUNTER — Other Ambulatory Visit (HOSPITAL_COMMUNITY): Payer: Self-pay

## 2022-10-26 ENCOUNTER — Ambulatory Visit: Payer: 59 | Admitting: Obstetrics & Gynecology

## 2022-10-31 ENCOUNTER — Encounter: Payer: Self-pay | Admitting: Obstetrics & Gynecology

## 2022-10-31 ENCOUNTER — Other Ambulatory Visit: Payer: Self-pay

## 2022-10-31 ENCOUNTER — Other Ambulatory Visit (HOSPITAL_COMMUNITY)
Admission: RE | Admit: 2022-10-31 | Discharge: 2022-10-31 | Disposition: A | Discharge status: Home / Self Care | Payer: 59 | Source: Ambulatory Visit | Attending: Obstetrics & Gynecology | Admitting: Obstetrics & Gynecology

## 2022-10-31 ENCOUNTER — Ambulatory Visit (INDEPENDENT_AMBULATORY_CARE_PROVIDER_SITE_OTHER): Payer: 59 | Admitting: Obstetrics & Gynecology

## 2022-10-31 VITALS — BP 123/86 | HR 77 | Wt 239.2 lb

## 2022-10-31 DIAGNOSIS — N898 Other specified noninflammatory disorders of vagina: Secondary | ICD-10-CM | POA: Diagnosis not present

## 2022-10-31 DIAGNOSIS — K625 Hemorrhage of anus and rectum: Secondary | ICD-10-CM

## 2022-10-31 DIAGNOSIS — Z1211 Encounter for screening for malignant neoplasm of colon: Secondary | ICD-10-CM | POA: Diagnosis not present

## 2022-10-31 DIAGNOSIS — B3731 Acute candidiasis of vulva and vagina: Secondary | ICD-10-CM

## 2022-10-31 DIAGNOSIS — Z01419 Encounter for gynecological examination (general) (routine) without abnormal findings: Secondary | ICD-10-CM

## 2022-10-31 DIAGNOSIS — D649 Anemia, unspecified: Secondary | ICD-10-CM | POA: Insufficient documentation

## 2022-10-31 DIAGNOSIS — N760 Acute vaginitis: Secondary | ICD-10-CM

## 2022-10-31 DIAGNOSIS — Z113 Encounter for screening for infections with a predominantly sexual mode of transmission: Secondary | ICD-10-CM | POA: Diagnosis not present

## 2022-10-31 DIAGNOSIS — B9689 Other specified bacterial agents as the cause of diseases classified elsewhere: Secondary | ICD-10-CM | POA: Diagnosis not present

## 2022-10-31 HISTORY — DX: Anemia, unspecified: D64.9

## 2022-10-31 NOTE — Patient Instructions (Signed)
[facility]   [facility]

## 2022-10-31 NOTE — Progress Notes (Signed)
GYNECOLOGY ANNUAL PREVENTATIVE CARE ENCOUNTER NOTE  History:     Cathy Ho is a 42 y.o. 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  Current complaints: vaginal odor periodically, also vaginal dryness.  Uses boric acid vaginally which helps with odor. Had leftover estradiol cream that she attempts to use for dryness, this causes irritation.  Also reports two recent episodes of significant rectal bleeding, attributes this to constipation and/or internal hemorrhoids.   Denies abnormal vaginal bleeding, discharge, pelvic pain, problems with intercourse or other gynecologic concerns.    Gynecologic History Patient's last menstrual period was 03/03/2021 (approximate). Last Mammogram: 03/16/2022.  Result was normal  Obstetric History OB History  Gravida Para Term Preterm AB Living  0 0          SAB IAB Ectopic Multiple Live Births               Past Medical History:  Diagnosis Date   Anemia    Arthritis    Back pain    Chest pain    COVID 08/2020   Runny nose, fever, HA symptoms resolved   Diabetes mellitus without complication (HCC) 2007   type 2   Fibroids 2010   Hidradenitis suppurativa    Hypertension    Joint pain    Low hemoglobin 10/31/2022   Low iron    PCOS (polycystic ovarian syndrome)    Prolapsed uterus 2010   Sleep apnea    cpap, pt does not know settings   Vitamin D deficiency    Wears glasses     Past Surgical History:  Procedure Laterality Date   carbuckle cyst in back removed  02/2005   COLONOSCOPY N/A 08/15/2013   Procedure: COLONOSCOPY;  Surgeon: Louis Meckel, MD;  Location: [facility];  Service: Endoscopy;  Laterality: N/A;   CYST EXCISION N/A 03/24/2022   Procedure: EXCISION OF CHEST SEBACEAUS CYST;  Surgeon: Harriette Bouillon, MD;  Location: [facility];  Service: General;  Laterality: N/A;   DILATION AND CURETTAGE OF UTERUS   2011   Endometrial hyperplasia with atypia, submucosal fibroid   HYSTEROSCOPY  2011   HYSTEROSCOPY WITH D & C N/A 03/03/2021   Procedure: DILATATION AND CURETTAGE /HYSTEROSCOPY;  Surgeon: Tereso Newcomer, MD;  Location: [facility];  Service: Gynecology;  Laterality: N/A;   ROBOTIC ASSISTED LAPAROSCOPIC HYSTERECTOMY AND SALPINGECTOMY N/A 04/08/2021   Procedure: XI ROBOTIC ASSISTED LAPAROSCOPIC HYSTERECTOMY AND SALPINGECTOMY;  Surgeon: Carver Fila, MD;  Location: [facility];  Service: Gynecology;  Laterality: N/A;   ROBOTIC ASSISTED LAPAROSCOPIC SACROCOLPOPEXY N/A 04/08/2021   Procedure: XI ROBOTIC ASSISTED LAPAROSCOPIC SACROCOLPOPEXY;  Surgeon: Crist Fat, MD;  Location: [facility];  Service: Urology;  Laterality: N/A;   SENTINEL NODE BIOPSY N/A 04/08/2021   Procedure: SENTINEL NODE BIOPSY;  Surgeon: Carver Fila, MD;  Location: [facility];  Service: Gynecology;  Laterality: N/A;   WISDOM TOOTH EXTRACTION  1998    Current Outpatient Medications on File Prior to Visit  Medication Sig Dispense Refill   b complex vitamins capsule Take 1 capsule by mouth daily.     Biotin 1 MG CAPS Take by mouth.     cephALEXin (KEFLEX) 500 MG capsule Take by mouth.     Dapagliflozin Pro-metFORMIN ER (XIGDUO XR) 08-998 MG TB24 Take 1 tablet by mouth in the morning after a meal 90 tablet 3   estradiol (ESTRACE VAGINAL) 0.1 MG/GM  vaginal cream Place 1 Applicatorful ( a small bead) inside vagina every other day. 42.5 g 3   ferrous sulfate 325 (65 FE) MG EC tablet 1 tablet Orally Once a day for 90 day(s)     glipiZIDE (GLUCOTROL) 5 MG tablet Take by mouth.     glucose blood (FREESTYLE LITE) test strip Use to check glucose once per day 150 each 98   lisinopril (ZESTRIL) 20 MG tablet Take 1 tablet every day by oral route.     lisinopril-hydrochlorothiazide (ZESTORETIC) 10-12.5 MG tablet Take 1 tablet by mouth daily. 90 tablet 3   metFORMIN (GLUCOPHAGE) 500 MG tablet Take by mouth.      Multiple Vitamins-Minerals (MULTIVITAMIN ADULT, MINERALS, PO) Take 1 tablet by mouth daily.     tirzepatide (MOUNJARO) 12.5 MG/0.5ML Pen Inject 12.5 mg into the skin once a week. 6 mL 0   tirzepatide (MOUNJARO) 15 MG/0.5ML Pen Inject 15 mg into the skin once a week. 6 mL 1   No current facility-administered medications on file prior to visit.    No Known Allergies  Social History:  reports that she has never smoked. She has never used smokeless tobacco. She reports current alcohol use. She reports that she does not use drugs.  Family History  Problem Relation Age of Onset   Hypertension Mother    Diabetes Mother    Obesity Mother    Hypertension Father    Diabetes Father    Sleep apnea Father    Obesity Father    Seizures Sister    Stroke Maternal Aunt    Diabetes Maternal Aunt    Hypertension Maternal Aunt    Diabetes Maternal Uncle    Hypertension Maternal Grandfather    Heart disease Maternal Grandfather    Diabetes Maternal Grandfather    Diabetes Paternal Grandmother    Heart disease Paternal Grandmother    Breast cancer Cousin    Colon cancer Neg Hx    Liver disease Neg Hx    Kidney disease Neg Hx    Ovarian cancer Neg Hx    Endometrial cancer Neg Hx    Pancreatic cancer Neg Hx    Prostate cancer Neg Hx     The following portions of the patient's history were reviewed and updated as appropriate: allergies, current medications, past family history, past medical history, past social history, past surgical history and problem list.  Review of Systems Pertinent items noted in HPI and remainder of comprehensive ROS otherwise negative.  Physical Exam:  BP 123/86   Pulse 77   Wt 239 lb 3.2 oz (108.5 kg)   LMP 03/03/2021 (Approximate)   BMI 41.06 kg/m  CONSTITUTIONAL: Well-developed, well-nourished female in no acute distress.  HENT:  Normocephalic, atraumatic, External right and left ear normal.  EYES: Conjunctivae and EOM are normal. Pupils are equal, round, and  reactive to light. No scleral icterus.  NECK: Normal range of motion, supple, no masses.  Normal thyroid.  SKIN: Skin is warm and dry. No rash noted. Not diaphoretic. No erythema. No pallor. MUSCULOSKELETAL: Normal range of motion. No tenderness.  No cyanosis, clubbing, or edema. NEUROLOGIC: Alert and oriented to person, place, and time. Normal reflexes, muscle tone coordination.  PSYCHIATRIC: Normal mood and affect. Normal behavior. Normal judgment and thought content. CARDIOVASCULAR: Normal heart rate noted, regular rhythm RESPIRATORY: Clear to auscultation bilaterally. Effort and breath sounds normal, no problems with respiration noted. BREASTS: Deferred by patient. ABDOMEN: Soft, no distention noted.  No tenderness, rebound or guarding.  PELVIC:  Deferred by patient. Self-swab sample obtained by patient.   Assessment and Plan:    1. Vaginal odor Discussed changes in vaginal pH that can cause odor. Proper vulvar hygiene emphasized. Will follow up test and manage accordingly. - Cervicovaginal ancillary only  2. Vaginal dryness Discussed hyaluronic acid moisturizers, she wants to try this.  Gave information about [facility] and [facility]. Will monitor response.  3. Rectal bleeding 4. Colon cancer screening Referred to GI for further evaluation. Patient agreed with this referral. - Ambulatory referral to Gastroenterology  5. Routine screening for STI (sexually transmitted infection) STI screen done, will follow up results and manage accordingly. - Cervicovaginal ancillary only - RPR+HBsAg+HCVAb+...  6. Well woman exam with routine gynecological exam No need for paps after benign hysterectomy pathology. Routine preventative health maintenance measures emphasized. Please refer to After Visit Summary for other counseling recommendations.      Jaynie Collins, MD, FACOG Obstetrician & Gynecologist, [facility] for [employer], [facility]

## 2022-11-01 LAB — RPR+HBSAG+HCVAB+...
HIV Screen 4th Generation wRfx: NONREACTIVE
Hep C Virus Ab: NONREACTIVE
Hepatitis B Surface Ag: NEGATIVE
RPR Ser Ql: NONREACTIVE

## 2022-11-02 ENCOUNTER — Encounter: Payer: Self-pay | Admitting: Obstetrics & Gynecology

## 2022-11-02 ENCOUNTER — Other Ambulatory Visit (HOSPITAL_COMMUNITY): Payer: Self-pay

## 2022-11-02 LAB — CERVICOVAGINAL ANCILLARY ONLY
Bacterial Vaginitis (gardnerella): POSITIVE — AB
Candida Glabrata: NEGATIVE
Candida Vaginitis: POSITIVE — AB
Chlamydia: NEGATIVE
Comment: NEGATIVE
Comment: NEGATIVE
Comment: NEGATIVE
Comment: NEGATIVE
Comment: NEGATIVE
Comment: NORMAL
Neisseria Gonorrhea: NEGATIVE
Trichomonas: NEGATIVE

## 2022-11-02 MED ORDER — FLUCONAZOLE 150 MG PO TABS
150.0000 mg | ORAL_TABLET | ORAL | 1 refills | Status: DC
  Filled 2022-11-02: qty 3, 9d supply, fill #0
  Filled 2022-11-17: qty 3, 9d supply, fill #1

## 2022-11-02 MED ORDER — METRONIDAZOLE 500 MG PO TABS
500.0000 mg | ORAL_TABLET | Freq: Two times a day (BID) | ORAL | 1 refills | Status: DC
  Filled 2022-11-02: qty 14, 7d supply, fill #0
  Filled 2022-11-17: qty 14, 7d supply, fill #1

## 2022-11-02 NOTE — Addendum Note (Signed)
Addended by: Jaynie Collins A on: 11/02/2022 02:01 PM   Modules accepted: Orders

## 2022-11-17 ENCOUNTER — Other Ambulatory Visit (HOSPITAL_COMMUNITY): Payer: Self-pay

## 2022-12-15 ENCOUNTER — Other Ambulatory Visit (HOSPITAL_COMMUNITY): Payer: Self-pay

## 2023-01-12 ENCOUNTER — Other Ambulatory Visit (HOSPITAL_COMMUNITY): Payer: Self-pay

## 2023-01-12 ENCOUNTER — Other Ambulatory Visit: Payer: Self-pay

## 2023-01-12 MED ORDER — LISINOPRIL-HYDROCHLOROTHIAZIDE 10-12.5 MG PO TABS
1.0000 | ORAL_TABLET | Freq: Every day | ORAL | 3 refills | Status: DC
  Filled 2023-01-12: qty 90, 90d supply, fill #0
  Filled 2023-04-23 – 2023-05-12 (×2): qty 90, 90d supply, fill #1
  Filled 2023-09-18: qty 90, 90d supply, fill #2
  Filled 2023-12-27: qty 90, 90d supply, fill #3

## 2023-02-06 ENCOUNTER — Other Ambulatory Visit: Payer: Self-pay

## 2023-02-06 ENCOUNTER — Other Ambulatory Visit (HOSPITAL_COMMUNITY): Payer: Self-pay

## 2023-02-07 ENCOUNTER — Other Ambulatory Visit (HOSPITAL_COMMUNITY): Payer: Self-pay

## 2023-02-13 ENCOUNTER — Other Ambulatory Visit (HOSPITAL_COMMUNITY): Payer: Self-pay

## 2023-04-04 DIAGNOSIS — E119 Type 2 diabetes mellitus without complications: Secondary | ICD-10-CM | POA: Diagnosis not present

## 2023-04-04 DIAGNOSIS — I10 Essential (primary) hypertension: Secondary | ICD-10-CM | POA: Diagnosis not present

## 2023-04-05 ENCOUNTER — Other Ambulatory Visit: Payer: Self-pay | Admitting: Family Medicine

## 2023-04-05 DIAGNOSIS — Z1231 Encounter for screening mammogram for malignant neoplasm of breast: Secondary | ICD-10-CM

## 2023-04-06 ENCOUNTER — Ambulatory Visit
Admission: RE | Admit: 2023-04-06 | Discharge: 2023-04-06 | Disposition: A | Discharge status: Home / Self Care | Payer: 59 | Source: Ambulatory Visit | Attending: Family Medicine | Admitting: Family Medicine

## 2023-04-06 DIAGNOSIS — Z1231 Encounter for screening mammogram for malignant neoplasm of breast: Secondary | ICD-10-CM | POA: Diagnosis not present

## 2023-04-10 DIAGNOSIS — G5601 Carpal tunnel syndrome, right upper limb: Secondary | ICD-10-CM | POA: Diagnosis not present

## 2023-04-10 DIAGNOSIS — E119 Type 2 diabetes mellitus without complications: Secondary | ICD-10-CM | POA: Diagnosis not present

## 2023-04-10 DIAGNOSIS — I10 Essential (primary) hypertension: Secondary | ICD-10-CM | POA: Diagnosis not present

## 2023-04-10 DIAGNOSIS — E559 Vitamin D deficiency, unspecified: Secondary | ICD-10-CM | POA: Diagnosis not present

## 2023-04-23 ENCOUNTER — Other Ambulatory Visit (HOSPITAL_COMMUNITY): Payer: Self-pay

## 2023-04-23 ENCOUNTER — Encounter: Payer: Self-pay | Admitting: Obstetrics & Gynecology

## 2023-04-24 ENCOUNTER — Other Ambulatory Visit: Payer: Self-pay

## 2023-04-25 ENCOUNTER — Encounter: Payer: Self-pay | Admitting: Gastroenterology

## 2023-04-27 ENCOUNTER — Other Ambulatory Visit (HOSPITAL_COMMUNITY): Payer: Self-pay

## 2023-05-01 ENCOUNTER — Other Ambulatory Visit (HOSPITAL_COMMUNITY): Payer: Self-pay

## 2023-05-03 ENCOUNTER — Other Ambulatory Visit (HOSPITAL_COMMUNITY): Payer: Self-pay

## 2023-05-04 ENCOUNTER — Other Ambulatory Visit (HOSPITAL_COMMUNITY): Payer: Self-pay

## 2023-05-05 ENCOUNTER — Encounter: Payer: Self-pay | Admitting: Orthopaedic Surgery

## 2023-05-05 ENCOUNTER — Ambulatory Visit (INDEPENDENT_AMBULATORY_CARE_PROVIDER_SITE_OTHER): Payer: 59 | Admitting: Orthopaedic Surgery

## 2023-05-05 DIAGNOSIS — G5601 Carpal tunnel syndrome, right upper limb: Secondary | ICD-10-CM | POA: Diagnosis not present

## 2023-05-05 DIAGNOSIS — G5602 Carpal tunnel syndrome, left upper limb: Secondary | ICD-10-CM

## 2023-05-05 NOTE — Addendum Note (Signed)
 Addended by: Wendi Maya on: 05/05/2023 10:47 AM   Modules accepted: Orders

## 2023-05-05 NOTE — Progress Notes (Signed)
 Office Visit Note   Patient: Cathy Ho           Date of Birth: 08-26-1980           MRN: [id number] Visit Date: 05/05/2023              Requested by: Gerome Brunet, DO [address] [place],  [place] [address] PCP: Gerome Brunet, DO   Assessment & Plan: Visit Diagnoses:  1. Right carpal tunnel syndrome   2. Left carpal tunnel syndrome     Plan: Ms. Nena is a very pleasant 43 year old female with probable bilateral carpal tunnel syndrome.  Will order EMGs and provide nighttime splints.  Follow-up after the EMGs.  Follow-Up Instructions: No follow-ups on file.   Orders:  No orders of the defined types were placed in this encounter.  No orders of the defined types were placed in this encounter.     Procedures: No procedures performed   Clinical Data: No additional findings.   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  Her experiencing numbness and pain mainly to the thumb and long and ring fingers.  Has been wearing a brace occasionally at night.  Right-hand-dominant. Review of Systems  Constitutional: Negative.   HENT: Negative.    Eyes: Negative.   Respiratory: Negative.    Cardiovascular: Negative.   Endocrine: Negative.   Musculoskeletal: Negative.   Neurological: Negative.   Hematological: Negative.   Psychiatric/Behavioral: Negative.    All other systems reviewed and are negative.    Objective: Vital Signs: LMP 03/03/2021 (Approximate)   Physical Exam Vitals and nursing note reviewed.  Constitutional:      Appearance: She is well-developed.  HENT:     Head: Atraumatic.     Nose: Nose normal.  Eyes:     Extraocular Movements: Extraocular movements intact.  Cardiovascular:      Pulses: Normal pulses.  Pulmonary:     Effort: Pulmonary effort is normal.  Abdominal:     Palpations: Abdomen is soft.  Musculoskeletal:     Cervical back: Neck supple.  Skin:    General: Skin is warm.     Capillary Refill: Capillary refill takes less than 2 seconds.  Neurological:     Mental Status: She is alert. Mental status is at baseline.  Psychiatric:        Behavior: Behavior normal.        Thought Content: Thought content normal.        Judgment: Judgment normal.     Ortho Exam Examination of the hands show no muscle atrophy.  Negative carpal tunnel compressive signs.  No neurovascular compromise. Specialty Comments:  No specialty comments available.  Imaging: No results found.   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   Vitamin D  deficiency 03/23/2020   Depression 02/11/2020   Type 2 diabetes mellitus with microalbuminuria, without long-term current use of insulin  (HCC) 01/13/2020   Class 3 severe obesity with serious comorbidity  and body mass index (BMI) of 45.0 to 49.9 in adult (HCC) 10/14/2019   Candidiasis of vagina 10/14/2019   Hemoglobin low 10/14/2019   Hypertension associated with type 2 diabetes mellitus (HCC) 09/01/2017   Uncontrolled type 2 diabetes mellitus with hyperglycemia (HCC) 09/01/2017   Abscess of neck 09/01/2017   Benign neoplasm of colon 08/15/2013   Internal hemorrhoids 08/15/2013   Rectal bleeding 07/02/2013   Hidradenitis suppurativa 10/06/2008   Keloid scar 08/04/2008   Past Medical History:  Diagnosis Date   Anemia    Arthritis    Back pain    Chest pain    COVID 08/2020   Runny nose, fever, HA symptoms resolved   Diabetes  mellitus without complication (HCC) 2007   type 2   Fibroids 2010   Hidradenitis suppurativa    Hypertension    Joint pain    Low hemoglobin 10/31/2022   Low iron    PCOS (polycystic ovarian syndrome)    Prolapsed uterus 2010   Sleep apnea    cpap, pt does not know settings   Vitamin D  deficiency    Wears glasses     Family History  Problem Relation Age of Onset   Hypertension Mother    Diabetes Mother    Obesity Mother    Hypertension Father    Diabetes Father    Sleep apnea Father    Obesity Father    Seizures Sister    Stroke Maternal Aunt    Diabetes Maternal Aunt    Hypertension Maternal Aunt    Diabetes Maternal Uncle    Hypertension Maternal Grandfather    Heart disease Maternal Grandfather    Diabetes Maternal Grandfather    Diabetes Paternal Grandmother    Heart disease Paternal Grandmother    Breast cancer Cousin    Colon cancer Neg Hx    Liver disease Neg Hx    Kidney disease Neg Hx    Ovarian cancer Neg Hx    Endometrial cancer Neg Hx    Pancreatic cancer Neg Hx    Prostate cancer Neg Hx     Past Surgical History:  Procedure Laterality Date   carbuckle cyst in back removed  02/2005   COLONOSCOPY N/A 08/15/2013   Procedure: COLONOSCOPY;  Surgeon: Lamar JONETTA Aho, MD;  Location: [facility];  Service: Endoscopy;  Laterality: N/A;   CYST EXCISION N/A 03/24/2022   Procedure: EXCISION OF CHEST SEBACEAUS CYST;  Surgeon: Vanderbilt Ned, MD;  Location: [facility];  Service: General;  Laterality: N/A;   DILATION AND CURETTAGE OF UTERUS  2011   Endometrial hyperplasia with atypia, submucosal fibroid   HYSTEROSCOPY  2011   HYSTEROSCOPY WITH D & C N/A 03/03/2021   Procedure: DILATATION AND CURETTAGE /HYSTEROSCOPY;  Surgeon: Herchel Gloris LABOR, MD;  Location: [facility];  Service: Gynecology;  Laterality: N/A;   ROBOTIC ASSISTED LAPAROSCOPIC HYSTERECTOMY AND SALPINGECTOMY N/A 04/08/2021   Procedure: XI ROBOTIC ASSISTED LAPAROSCOPIC  HYSTERECTOMY AND SALPINGECTOMY;  Surgeon: Viktoria Comer SAUNDERS, MD;  Location: [facility];  Service: Gynecology;  Laterality: N/A;   ROBOTIC ASSISTED LAPAROSCOPIC SACROCOLPOPEXY N/A 04/08/2021   Procedure: XI ROBOTIC ASSISTED LAPAROSCOPIC SACROCOLPOPEXY;  Surgeon: Cam Morene ORN, MD;  Location: [facility];  Service: Urology;  Laterality: N/A;   SENTINEL NODE BIOPSY N/A 04/08/2021   Procedure: SENTINEL NODE BIOPSY;  Surgeon: Viktoria Comer SAUNDERS, MD;  Location: [facility];  Service: Gynecology;  Laterality: N/A;   WISDOM TOOTH EXTRACTION  1998   Social History   Occupational  History   Occupation: Physiological Scientist  Tobacco Use   Smoking status: Never   Smokeless tobacco: Never  Vaping Use   Vaping status: Never Used  Substance and Sexual Activity   Alcohol use: Yes    Comment: occasional   Drug use: No   Sexual activity: Yes    Birth control/protection: None

## 2023-05-08 NOTE — Progress Notes (Signed)
 Chief Complaint: rectal bleeding and colon cancer screening Primary GI Doctor: (Previously Dr. Debrah) Dr. Leigh  HPI: Patient is a 43 year old female patient with past medical history of hypertension, DM II, sleep apnea, chronic anemia, who was referred to me by OB/GYN Herchel Grumet, MD on 10/31/2022 for rectal bleeding and colon cancer screening.    On 10/31/2022 patient saw PCP and reported two recent episodes of significant rectal bleeding, attributes it to constipation and internal hemorrhoids.  GI history: 07/03/2013 Patient was seen in the GI office by Dr. Debrah for constipation and rectal bleeding. Rectal exam revealed anal fissure. Patient was scheduled for colonoscopy.  08/15/2013 colonoscopy:  Sessile polyp measuring 2- 3 mm in rectum.  Internal hemorrhoids.  The colon otherwise normal.  Biopsy: BENIGN MUCOSAL PROLAPSE TYPE POLYP. NEGATIVE FOR DYSPLASIA OR MALIGNANCY. Recall 10 years.  Interval History   Patient presents for follow-up on rectal bleeding she had last July. She went to see her PCP and referral was made. She has not had rectal bleeding since that time. She states she has been managing her chronic constipation better. Patient reports she has had intermittent issues with constipation since 2022 after her hysterectomy. She uses OTC Metamucil 2 tsp 4-5 days a week. Senokot as needed. Patient has bowel movement every day which sometimes can be small or large amount. Patient will have some abdominal discomfort and bloating when she is more constipated. Patient denies GERD or dysphagia. Patient denies nausea, vomiting, or weight loss. Hx of chronic anemia secondary to menorrhagia. Patient had hysterectomy. Patient has been on Mounjaro  for 1 year, tolerating well. Socially drinks, very seldom. No tobacco use. Patient's family history includes cousin with stomach CA.  Wt Readings from Last 3 Encounters:  05/09/23 260 lb (117.9 kg)  10/31/22 239 lb 3.2 oz (108.5 kg)   03/24/22 249 lb 1.9 oz (113 kg)   Past Medical History:  Diagnosis Date   Anemia    Arthritis    Back pain    Chest pain    COVID 08/2020   Runny nose, fever, HA symptoms resolved   Diabetes mellitus without complication (HCC) 2007   type 2   Fibroids 2010   Hidradenitis suppurativa    Hypertension    Joint pain    Low hemoglobin 10/31/2022   Low iron    PCOS (polycystic ovarian syndrome)    Prolapsed uterus 2010   Sleep apnea    cpap, pt does not know settings   Vitamin D  deficiency    Wears glasses     Past Surgical History:  Procedure Laterality Date   carbuckle cyst in back removed  02/2005   COLONOSCOPY N/A 08/15/2013   Procedure: COLONOSCOPY;  Surgeon: Lamar JONETTA Debrah, MD;  Location: [facility];  Service: Endoscopy;  Laterality: N/A;   CYST EXCISION N/A 03/24/2022   Procedure: EXCISION OF CHEST SEBACEAUS CYST;  Surgeon: Vanderbilt Ned, MD;  Location: [facility];  Service: General;  Laterality: N/A;   DILATION AND CURETTAGE OF UTERUS  2011   Endometrial hyperplasia with atypia, submucosal fibroid   HYSTEROSCOPY  2011   HYSTEROSCOPY WITH D & C N/A 03/03/2021   Procedure: DILATATION AND CURETTAGE /HYSTEROSCOPY;  Surgeon: Herchel Grumet LABOR, MD;  Location: [facility];  Service: Gynecology;  Laterality: N/A;   ROBOTIC ASSISTED LAPAROSCOPIC HYSTERECTOMY AND SALPINGECTOMY N/A 04/08/2021   Procedure: XI ROBOTIC ASSISTED LAPAROSCOPIC HYSTERECTOMY AND SALPINGECTOMY;  Surgeon: Viktoria Comer SAUNDERS, MD;  Location: [facility];  Service: Gynecology;  Laterality: N/A;   ROBOTIC ASSISTED LAPAROSCOPIC SACROCOLPOPEXY N/A 04/08/2021   Procedure: XI ROBOTIC ASSISTED LAPAROSCOPIC SACROCOLPOPEXY;  Surgeon: Cam Morene ORN, MD;  Location: [facility];  Service: Urology;  Laterality: N/A;   SENTINEL NODE BIOPSY N/A 04/08/2021   Procedure: SENTINEL NODE BIOPSY;  Surgeon: Viktoria Comer SAUNDERS, MD;  Location: [facility];  Service: Gynecology;  Laterality: N/A;   WISDOM  TOOTH EXTRACTION  1998    Current Outpatient Medications  Medication Sig Dispense Refill   b complex vitamins capsule Take 1 capsule by mouth daily.     Biotin 1 MG CAPS Take by mouth.     estradiol  (ESTRACE  VAGINAL) 0.1 MG/GM vaginal cream Place 1 Applicatorful ( a small bead) inside vagina every other day. 42.5 g 3   glucose blood (FREESTYLE LITE) test strip Use to check glucose once per day 150 each 98   lisinopril -hydrochlorothiazide  (ZESTORETIC ) 10-12.5 MG tablet Take 1 tablet by mouth daily. 90 tablet 3   Multiple Vitamins-Minerals (MULTIVITAMIN ADULT, MINERALS, PO) Take 1 tablet by mouth daily.     tirzepatide  (MOUNJARO ) 15 MG/0.5ML Pen Inject 15 mg into the skin once a week. 6 mL 1   No current facility-administered medications for this visit.   Allergies as of 05/09/2023   (No Known Allergies)   Family History  Problem Relation Age of Onset   Hypertension Mother    Diabetes Mother    Obesity Mother    Hypertension Father    Diabetes Father    Sleep apnea Father    Obesity Father    Seizures Sister    Stroke Maternal Aunt    Diabetes Maternal Aunt    Hypertension Maternal Aunt    Diabetes Maternal Uncle    Hypertension Maternal Grandfather    Heart disease Maternal Grandfather    Diabetes Maternal Grandfather    Diabetes Paternal Grandmother    Heart disease Paternal Grandmother    Breast cancer Cousin    Colon cancer Neg Hx    Liver disease Neg Hx    Kidney disease Neg Hx    Ovarian cancer Neg Hx    Endometrial cancer Neg Hx    Pancreatic cancer Neg Hx    Prostate cancer Neg Hx    Review of Systems:    Constitutional: No weight loss, fever, chills, weakness or fatigue HEENT: Eyes: No change in vision               Ears, Nose, Throat:  No change in hearing or congestion Skin: No rash or itching Cardiovascular: No chest pain, chest pressure or palpitations   Respiratory: No SOB or cough Gastrointestinal: See HPI and otherwise negative Genitourinary: No  dysuria or change in urinary frequency Neurological: No headache, dizziness or syncope Musculoskeletal: No new muscle or joint pain Hematologic: No bleeding or bruising Psychiatric: No history of depression or anxiety    Physical Exam:  Vital signs: BP 120/80   Pulse 83   Ht 5' 4 (1.626 m)   Wt 260 lb (117.9 kg)   LMP 03/03/2021 (Approximate)   BMI 44.63 kg/m   Constitutional:  Pleasant  female appears to be in NAD, Well developed, Well nourished, alert and cooperative Throat: Oral cavity and pharynx without inflammation, swelling or lesion.  Respiratory: Respirations even and unlabored. Lungs clear to auscultation bilaterally.   No wheezes, crackles, or rhonchi.  Cardiovascular: Normal S1, S2. Regular rate and rhythm. No peripheral edema, cyanosis or pallor.  Gastrointestinal:  Soft, nondistended, nontender. No rebound or guarding. Normal  bowel sounds. No appreciable masses or hepatomegaly. Rectal:  Not performed.  Anoscopy: Skin:   Dry and intact without significant lesions or rashes. Psychiatric: Oriented to person, place and time. Demonstrates good judgement and reason without abnormal affect or behaviors.  RELEVANT LABS AND IMAGING: CBC    Latest Ref Rng & Units 06/08/2021   12:00 AM 04/08/2021    5:12 PM 03/25/2021    9:59 AM  CBC  WBC 4.0 - 10.5 K/uL  17.3  11.1   Hemoglobin 12.0 - 16.0 10.6     10.1  11.1   Hematocrit 36 - 46 33     31.5  36.5   Platelets 150 - 400 K/uL 496     464  701      This result is from an external source.    CMP     Latest Ref Rng & Units 03/22/2022   12:56 PM 06/08/2021   12:00 AM 04/08/2021    5:12 PM  CMP  Glucose 70 - 99 mg/dL 88   795   BUN 6 - 20 mg/dL 17  17     12    Creatinine 0.44 - 1.00 mg/dL 9.22  0.7     9.26   Sodium 135 - 145 mmol/L 140  137     135   Potassium 3.5 - 5.1 mmol/L 4.1  4.2     3.7   Chloride 98 - 111 mmol/L 105  101     106   CO2 22 - 32 mmol/L 24  28     18    Calcium 8.9 - 10.3 mg/dL 9.9  9.4     9.0    Alkaline Phos 25 - 125  99       AST 13 - 35  16       ALT 7 - 35 U/L  25          This result is from an external source.   04/04/2023 labs show: hgb 13.7, platelets 350, BUN 13/Creat 0.66,normal LFT's, Ha1c 6.0 09/23/2022 labs show: vitamin D  48.2, TSH 1.480  Assessment: Encounter Diagnoses  Name Primary?   Chronic idiopathic constipation Yes   Rectal bleeding    Bloating    History of colonic polyps     43 year old African American patient that presents with chronic constipation and rectal bleeding. Since optimizing her bowel regimen with fiber supplementation she has been more regular and the rectal bleeding has resolved. Last episode of rectal bleeding was prior to PCP visit on 10/2022. Reinforced high fiber diet. She can use OTC Miralax  as needed. She does have history of a sessile polyp and due for colon screening colonoscopy (2 day prep) 07/2023, recall placed.  Plan: -Schedule colonoscopy with 2 day prep at [facility] with Dr. Leigh , recall 07/2023 -The risks and benefits of colonoscopy with possible polypectomy / biopsies were discussed and the patient agrees to proceed.  -Hold mounjaro  1 week prior to procedure  Thank you for the courtesy of this consult. Please call me with any questions or concerns.   Gumecindo Hopkin, FNP-C [facility] 05/09/2023, 4:40 PM  Cc: Gerome Brunet, DO

## 2023-05-09 ENCOUNTER — Other Ambulatory Visit (HOSPITAL_COMMUNITY): Payer: Self-pay

## 2023-05-09 ENCOUNTER — Encounter: Payer: Self-pay | Admitting: Gastroenterology

## 2023-05-09 ENCOUNTER — Ambulatory Visit (INDEPENDENT_AMBULATORY_CARE_PROVIDER_SITE_OTHER): Payer: 59 | Admitting: Gastroenterology

## 2023-05-09 VITALS — BP 120/80 | HR 83 | Ht 64.0 in | Wt 260.0 lb

## 2023-05-09 DIAGNOSIS — R140 Abdominal distension (gaseous): Secondary | ICD-10-CM | POA: Diagnosis not present

## 2023-05-09 DIAGNOSIS — Z860101 Personal history of adenomatous and serrated colon polyps: Secondary | ICD-10-CM

## 2023-05-09 DIAGNOSIS — K5904 Chronic idiopathic constipation: Secondary | ICD-10-CM | POA: Diagnosis not present

## 2023-05-09 DIAGNOSIS — Z860100 Personal history of colon polyps, unspecified: Secondary | ICD-10-CM

## 2023-05-09 DIAGNOSIS — K625 Hemorrhage of anus and rectum: Secondary | ICD-10-CM

## 2023-05-09 NOTE — Patient Instructions (Signed)
 Your colonoscopy recall is due 07/2023. You will receive a reminder letter to call and schedule per your request. You will need a 2 day colonoscopy prep  Use Metamucil 2 tsp daily  Use Miralax  as needed  High-Fiber Eating Plan Fiber, also called dietary fiber, is found in foods such as fruits, vegetables, whole grains, and beans. A high-fiber diet can be good for your health. Your health care provider may recommend a high-fiber diet to help: Prevent trouble pooping (constipation). Lower your cholesterol. Treat the following conditions: Hemorrhoids. This is inflammation of veins in the anus. Inflammation of specific areas of the digestive tract. Irritable bowel syndrome (IBS). This is a problem of the large intestine, also called the colon, that sometimes causes belly pain and bloating. Prevent overeating as part of a weight-loss plan. Lower the risk of heart disease, type 2 diabetes, and certain cancers. What are tips for following this plan? Reading food labels  Check the nutrition facts label on foods for the amount of dietary fiber. Choose foods that have 4 grams of fiber or more per serving. The recommended goals for how much fiber you should eat each day include: Males 87 years old or younger: 30-34 g. Males over 81 years old: 28-34 g. Females 62 years old or younger: 25-28 g. Females over 88 years old: 22-25 g. Your daily fiber goal is _____________ g. Shopping Choose whole fruits and vegetables instead of processed. For example, choose apples instead of apple juice or applesauce. Choose a variety of high-fiber foods such as avocados, lentils, oats, and pinto beans. Read the nutrition facts label on foods. Check for foods with added fiber. These foods often have high sugar and salt (sodium) amounts per serving. Cooking Use whole-grain flour for baking and cooking. Cook with brown rice instead of white rice. Make meals that have a lot of beans and vegetables in them, such as  chili or vegetable-based soups. Meal planning Start the day with a breakfast that is high in fiber, such as a cereal that has 5 g of fiber or more per serving. Eat breads and cereals that are made with whole-grain flour instead of refined flour or white flour. Eat brown rice, bulgur wheat, or millet instead of white rice. Use beans in place of meat in soups, salads, and pasta dishes. Be sure that half of the grains you eat each day are whole grains. General information You can get the recommended amount of dietary fiber by: Eating a variety of fruits, vegetables, grains, nuts, and beans. Taking a fiber supplement if you aren't able to eat enough fiber. It's better to get fiber through food than from a supplement. Slowly increase how much fiber you eat. If you increase the amount of fiber you eat too quickly, you may have bloating, cramping, or gas. Drink plenty of water  to help you digest fiber. Choose high-fiber snacks, such as berries, raw vegetables, nuts, and popcorn. What foods should I eat? Fruits Berries. Pears. Apples. Oranges. Avocado. Prunes and raisins. Dried figs. Vegetables Sweet potatoes. Spinach. Kale. Artichokes. Cabbage. Broccoli. Cauliflower. Green peas. Carrots. Squash. Grains Whole-grain breads. Multigrain cereal. Oats and oatmeal. Brown rice. Barley. Bulgur wheat. Millet. Quinoa. Bran muffins. Popcorn. Rye wafer crackers. Meats and other proteins Navy beans, kidney beans, and pinto beans. Soybeans. Split peas. Lentils. Nuts and seeds. Dairy Fiber-fortified yogurt. Fortified means that fiber has been added to the product. Beverages Fiber-fortified soy milk. Fiber-fortified orange juice. Other foods Fiber bars. The items listed above may not be all  the foods and drinks you can have. Talk to a dietitian to learn more. What foods should I avoid? Fruits Fruit juice. Cooked, strained fruit. Vegetables Fried potatoes. Canned vegetables. Well-cooked  vegetables. Grains White bread. Pasta made with refined flour. White rice. Meats and other proteins Fatty meat. Fried chicken or fried fish. Dairy Milk. Cream cheese. Sour cream. Fats and oils Butters. Beverages Soft drinks. Other foods Cakes and pastries. The items listed above may not be all the foods and drinks you should avoid. Talk to a dietitian to learn more. This information is not intended to replace advice given to you by your health care provider. Make sure you discuss any questions you have with your health care provider. Document Revised: 07/04/2022 Document Reviewed: 07/04/2022 Elsevier Patient Education  2024 Elsevier Inc.   I appreciate the  opportunity to care for you  Thank You   Deanna May, NP

## 2023-05-09 NOTE — Progress Notes (Signed)
 Agree with assessment and plan as outlined.

## 2023-05-11 ENCOUNTER — Other Ambulatory Visit (HOSPITAL_COMMUNITY): Payer: Self-pay

## 2023-05-12 ENCOUNTER — Other Ambulatory Visit (HOSPITAL_COMMUNITY): Payer: Self-pay

## 2023-05-12 MED ORDER — MOUNJARO 15 MG/0.5ML ~~LOC~~ SOAJ
15.0000 mg | SUBCUTANEOUS | 1 refills | Status: AC
Start: 2023-05-12 — End: ?
  Filled 2023-05-12: qty 6, 84d supply, fill #0
  Filled 2023-07-30: qty 6, 84d supply, fill #1

## 2023-05-26 ENCOUNTER — Encounter: Payer: 59 | Admitting: Physical Medicine and Rehabilitation

## 2023-06-07 ENCOUNTER — Ambulatory Visit (INDEPENDENT_AMBULATORY_CARE_PROVIDER_SITE_OTHER): Payer: 59 | Admitting: Physical Medicine and Rehabilitation

## 2023-06-07 DIAGNOSIS — M79642 Pain in left hand: Secondary | ICD-10-CM | POA: Diagnosis not present

## 2023-06-07 DIAGNOSIS — R531 Weakness: Secondary | ICD-10-CM

## 2023-06-07 DIAGNOSIS — M79641 Pain in right hand: Secondary | ICD-10-CM

## 2023-06-07 DIAGNOSIS — M25532 Pain in left wrist: Secondary | ICD-10-CM

## 2023-06-07 DIAGNOSIS — R202 Paresthesia of skin: Secondary | ICD-10-CM

## 2023-06-07 NOTE — Progress Notes (Signed)
Pain score-8 Right hand dominate Numbness and tingling only at night Occ grasping issues

## 2023-06-08 NOTE — Procedures (Signed)
EMG & NCV Findings: Evaluation of the left median motor and the right median motor nerves showed prolonged distal onset latency (L4.5, R5.5 ms) and decreased conduction velocity (Elbow-Wrist, L48, R48 m/s).  The left median (across palm) sensory and the right median (across palm) sensory nerves showed no response (Palm) and prolonged distal peak latency (L4.2, R5.7 ms).  All remaining nerves (as indicated in the following tables) were within normal limits.  Left vs. Right side comparison data for the median motor nerve indicates abnormal L-R latency difference (1.0 ms).  The ulnar motor nerve indicates abnormal L-R amplitude difference (44.5 %) and abnormal L-R velocity difference (A Elbow-B Elbow, 18 m/s).  All remaining left vs. right side differences were within normal limits.    Needle evaluation of the right abductor pollicis brevis muscle showed diminished recruitment.  All remaining muscles (as indicated in the following table) showed no evidence of electrical instability.    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   There is no significant electrodiagnostic evidence of any other focal nerve entrapment, brachial plexopathy or cervical radiculopathy  Recommendations: 1.  Follow-up with referring physician. 2.  Continue current management of symptoms. 3.  Continue use of resting splint at night-time and as needed during the day. 4.  Suggest surgical evaluation.  ___________________________ Cathy Ho FAAPMR Board Certified, American Board of Physical Medicine and Rehabilitation    Nerve Conduction Studies Anti Sensory Summary Table   Stim Site NR Peak (ms) Norm Peak (ms) P-T Amp (V) Norm P-T Amp Site1 Site2 Delta-P (ms) Dist (cm) Vel (m/s) Norm Vel (m/s)  Left Median Acr Palm Anti Sensory (2nd Digit)  30.4C  Wrist     *4.2 <3.6 26.8 >10 Wrist Palm  0.0    Palm *NR  <2.0          Right Median Acr Palm Anti Sensory (2nd Digit)  28.6C  Wrist    *5.7 <3.6 12.9 >10 Wrist Palm  0.0    Palm *NR  <2.0          Left Radial Anti Sensory (Base 1st Digit)  30.2C  Wrist    2.1 <3.1 26.4  Wrist Base 1st Digit 2.1 0.0    Right Radial Anti Sensory (Base 1st Digit)  28.7C  Wrist    2.1 <3.1 26.6  Wrist Base 1st Digit 2.1 0.0    Left Ulnar Anti Sensory (5th Digit)  30.6C  Wrist    3.4 <3.7 21.3 >15.0 Wrist 5th Digit 3.4 14.0 41 >38  Right Ulnar Anti Sensory (5th Digit)  29.1C  Wrist    3.1 <3.7 35.5 >15.0 Wrist 5th Digit 3.1 14.0 45 >38   Motor Summary Table   Stim Site NR Onset (ms) Norm Onset (ms) O-P Amp (mV) Norm O-P Amp Site1 Site2 Delta-0 (ms) Dist (cm) Vel (m/s) Norm Vel (m/s)  Left Median Motor (Abd Poll Brev)  30.4C  Wrist    *4.5 <4.2 6.2 >5 Elbow Wrist 4.6 22.0 *48 >50  Elbow    9.1  5.8         Right Median Motor (Abd Poll Brev)  28.9C  Wrist    *5.5 <4.2 9.7 >5 Elbow Wrist 4.7 22.5 *48 >50  Elbow    10.2  9.6         Left Ulnar Motor (Abd Dig Min)  30.5C  Wrist  3.3 <4.2 6.1 >3 B Elbow Wrist 3.8 21.0 55 >53  B Elbow    7.1  3.0  A Elbow B Elbow 1.9 10.0 53 >53  A Elbow    9.0  7.6         Right Ulnar Motor (Abd Dig Min)  29.4C  Wrist    3.0 <4.2 11.0 >3 B Elbow Wrist 3.6 21.5 60 >53  B Elbow    6.6  10.5  A Elbow B Elbow 1.4 10.0 71 >53  A Elbow    8.0  10.2          EMG   Side Muscle Nerve Root Ins Act Fibs Psw Amp Dur Poly Recrt Int Dennie Bible Comment  Right Abd Poll Brev Median C8-T1 Nml Nml Nml Nml Nml 0 *Reduced Nml   Right 1stDorInt Ulnar C8-T1 Nml Nml Nml Nml Nml 0 Nml Nml   Right PronatorTeres Median C6-7 Nml Nml Nml Nml Nml 0 Nml Nml   Right Biceps Musculocut C5-6 Nml Nml Nml Nml Nml 0 Nml Nml   Right Deltoid Axillary C5-6 Nml Nml Nml Nml Nml 0 Nml Nml     Nerve Conduction Studies Anti Sensory Left/Right Comparison   Stim Site L Lat (ms) R Lat (ms) L-R Lat (ms) L Amp (V) R Amp  (V) L-R Amp (%) Site1 Site2 L Vel (m/s) R Vel (m/s) L-R Vel (m/s)  Median Acr Palm Anti Sensory (2nd Digit)  30.4C  Wrist *4.2 *5.7 1.5 26.8 12.9 51.9 Wrist Palm     Palm             Radial Anti Sensory (Base 1st Digit)  30.2C  Wrist 2.1 2.1 0.0 26.4 26.6 0.8 Wrist Base 1st Digit     Ulnar Anti Sensory (5th Digit)  30.6C  Wrist 3.4 3.1 0.3 21.3 35.5 40.0 Wrist 5th Digit 41 45 4   Motor Left/Right Comparison   Stim Site L Lat (ms) R Lat (ms) L-R Lat (ms) L Amp (mV) R Amp (mV) L-R Amp (%) Site1 Site2 L Vel (m/s) R Vel (m/s) L-R Vel (m/s)  Median Motor (Abd Poll Brev)  30.4C  Wrist *4.5 *5.5 *1.0 6.2 9.7 36.1 Elbow Wrist *48 *48 0  Elbow 9.1 10.2 1.1 5.8 9.6 39.6       Ulnar Motor (Abd Dig Min)  30.5C  Wrist 3.3 3.0 0.3 6.1 11.0 *44.5 B Elbow Wrist 55 60 5  B Elbow 7.1 6.6 0.5 3.0 10.5 71.4 A Elbow B Elbow 53 71 *18  A Elbow 9.0 8.0 1.0 7.6 10.2 25.5          Waveforms:

## 2023-06-08 NOTE — Progress Notes (Signed)
Cathy Ho - 43 y.o. female MRN [id number]  Date of birth: Oct 16, 1980  Office Visit Note: Visit Date: 06/07/2023 PCP: Irena Reichmann, DO Referred by: Tarry Kos, MD  Subjective: Chief Complaint  Patient presents with   Right Hand - Numbness   Left Hand - Numbness   HPI: Cathy Ho is a 43 y.o. female who comes in today at the request of Dr. Glee Arvin for evaluation and management of chronic, worsening and severe pain, numbness and tingling in the Bilateral upper extremities.  Patient is Right hand dominant.  She reports a chronic history with recent worsening of bilateral hand and wrist pain with paresthesias into both hands somewhat globally.  With some more interviewing it is really more thumb, middle and ring fingers.  She does report weakness in general.  She does get a lot of nocturnal symptoms and she reports initially it was mainly at night and now it can be during the day and just seems to last longer.  She has worn braces and used anti-inflammatories etc. without any real relief overall.  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  She does use her hands quite a bit.  She has some neck pain but no frank radicular symptoms.  Her case is complicated by type 2 diabetes with fairly good hemoglobin A1c labs recently.  She is on Mounjaro.  No history of thyroid disease and no history of polyneuropathy.    I spent more than 30 minutes speaking face-to-face with the patient with 50% of the time in counseling and discussing coordination of care.      Review of Systems  Musculoskeletal:  Positive for joint pain.  Neurological:  Positive for tingling and weakness.  All other systems reviewed and are negative.  Otherwise per HPI.  Assessment & Plan: Visit Diagnoses:    ICD-10-CM   1. Paresthesia of skin  R20.2 NCV with EMG (electromyography)    2. Pain in left hand  M79.642     3. Pain in right hand  M79.641     4. Pain in left wrist  M25.532      5. 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  Electrodiagnostic study performed today.  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   There is no significant electrodiagnostic evidence of any other focal nerve entrapment, brachial plexopathy or cervical radiculopathy  Recommendations: 1.  Follow-up with referring physician. 2.  Continue current management of symptoms. 3.  Continue use of resting splint at night-time and as needed during the day. 4.  Suggest surgical evaluation.  Meds & Orders: No orders of the defined types were placed in this encounter.   Orders Placed This Encounter  Procedures   NCV with EMG (electromyography)    Follow-up: Return for  Glee Arvin, MD.   Procedures: No procedures performed  EMG & NCV Findings: Evaluation of the left median motor and the right median motor nerves showed prolonged distal onset latency (L4.5, R5.5 ms) and decreased conduction velocity (Elbow-Wrist, L48, R48 m/s).  The left median (across palm) sensory and the right median (across palm) sensory nerves showed no response (Palm) and prolonged distal peak latency (L4.2, R5.7 ms).  All remaining nerves (as indicated in the following tables) were within normal limits.  Left vs. Right side comparison data for the median motor nerve indicates abnormal L-R latency difference (1.0 ms).  The ulnar motor nerve indicates abnormal L-R amplitude difference (44.5 %) and abnormal L-R velocity difference (A Elbow-B Elbow, 18 m/s).  All remaining left vs. right side differences were within normal limits.    Needle evaluation of the right abductor pollicis brevis muscle showed diminished  recruitment.  All remaining muscles (as indicated in the following table) showed no evidence of electrical instability.    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   There is no significant electrodiagnostic evidence of any other focal nerve entrapment, brachial plexopathy or cervical radiculopathy  Recommendations: 1.  Follow-up with referring physician. 2.  Continue current management of symptoms. 3.  Continue use of resting splint at night-time and as needed during the day. 4.  Suggest surgical evaluation.  ___________________________ Naaman Plummer FAAPMR Board Certified, American Board of Physical Medicine and Rehabilitation    Nerve Conduction Studies Anti Sensory Summary Table   Stim Site NR Peak (ms) Norm Peak (ms) P-T Amp (V) Norm P-T Amp Site1 Site2 Delta-P (ms) Dist (cm) Vel (m/s) Norm Vel (m/s)  Left Median Acr Palm Anti Sensory (2nd Digit)  30.4C  Wrist    *4.2 <3.6 26.8 >10 Wrist Palm  0.0    Palm *NR  <2.0          Right Median Acr Palm Anti Sensory (2nd Digit)  28.6C  Wrist    *5.7 <3.6 12.9 >10 Wrist Palm  0.0    Palm *NR  <2.0          Left Radial Anti Sensory (Base 1st Digit)  30.2C  Wrist    2.1 <3.1 26.4  Wrist Base 1st Digit 2.1 0.0    Right Radial Anti Sensory (Base 1st Digit)  28.7C  Wrist    2.1 <3.1 26.6  Wrist Base 1st Digit 2.1 0.0    Left Ulnar Anti Sensory (5th Digit)  30.6C  Wrist    3.4 <3.7 21.3 >15.0 Wrist 5th Digit 3.4 14.0 41 >38  Right Ulnar Anti Sensory (5th Digit)  29.1C  Wrist    3.1 <3.7 35.5 >15.0 Wrist 5th Digit 3.1 14.0 45 >38   Motor Summary Table   Stim Site NR Onset (ms) Norm Onset (ms) O-P Amp (mV) Norm O-P Amp Site1 Site2 Delta-0 (ms) Dist (cm) Vel (m/s) Norm Vel (m/s)  Left Median Motor (Abd Poll Brev)  30.4C  Wrist    *4.5 <4.2 6.2 >5 Elbow Wrist 4.6 22.0  *48 >50  Elbow    9.1  5.8         Right Median Motor (Abd Poll Brev)  28.9C  Wrist    *5.5 <4.2 9.7 >5 Elbow Wrist 4.7 22.5 *48 >50  Elbow    10.2  9.6         Left Ulnar Motor (Abd Dig Min)  30.5C  Wrist    3.3 <4.2 6.1 >3 B Elbow Wrist 3.8 21.0 55 >53  B Elbow    7.1  3.0  A Elbow B Elbow 1.9 10.0 53 >53  A Elbow    9.0  7.6         Right Ulnar Motor (Abd Dig Min)  29.4C  Wrist  3.0 <4.2 11.0 >3 B Elbow Wrist 3.6 21.5 60 >53  B Elbow    6.6  10.5  A Elbow B Elbow 1.4 10.0 71 >53  A Elbow    8.0  10.2          EMG   Side Muscle Nerve Root Ins Act Fibs Psw Amp Dur Poly Recrt Int Dennie Bible Comment  Right Abd Poll Brev Median C8-T1 Nml Nml Nml Nml Nml 0 *Reduced Nml   Right 1stDorInt Ulnar C8-T1 Nml Nml Nml Nml Nml 0 Nml Nml   Right PronatorTeres Median C6-7 Nml Nml Nml Nml Nml 0 Nml Nml   Right Biceps Musculocut C5-6 Nml Nml Nml Nml Nml 0 Nml Nml   Right Deltoid Axillary C5-6 Nml Nml Nml Nml Nml 0 Nml Nml     Nerve Conduction Studies Anti Sensory Left/Right Comparison   Stim Site L Lat (ms) R Lat (ms) L-R Lat (ms) L Amp (V) R Amp (V) L-R Amp (%) Site1 Site2 L Vel (m/s) R Vel (m/s) L-R Vel (m/s)  Median Acr Palm Anti Sensory (2nd Digit)  30.4C  Wrist *4.2 *5.7 1.5 26.8 12.9 51.9 Wrist Palm     Palm             Radial Anti Sensory (Base 1st Digit)  30.2C  Wrist 2.1 2.1 0.0 26.4 26.6 0.8 Wrist Base 1st Digit     Ulnar Anti Sensory (5th Digit)  30.6C  Wrist 3.4 3.1 0.3 21.3 35.5 40.0 Wrist 5th Digit 41 45 4   Motor Left/Right Comparison   Stim Site L Lat (ms) R Lat (ms) L-R Lat (ms) L Amp (mV) R Amp (mV) L-R Amp (%) Site1 Site2 L Vel (m/s) R Vel (m/s) L-R Vel (m/s)  Median Motor (Abd Poll Brev)  30.4C  Wrist *4.5 *5.5 *1.0 6.2 9.7 36.1 Elbow Wrist *48 *48 0  Elbow 9.1 10.2 1.1 5.8 9.6 39.6       Ulnar Motor (Abd Dig Min)  30.5C  Wrist 3.3 3.0 0.3 6.1 11.0 *44.5 B Elbow Wrist 55 60 5  B Elbow 7.1 6.6 0.5 3.0 10.5 71.4 A Elbow B Elbow 53 71 *18  A Elbow 9.0 8.0 1.0 7.6  10.2 25.5          Waveforms:                     Clinical History: No specialty comments available.   She reports that she has never smoked. She has never used smokeless tobacco. No results for input(s): "HGBA1C", "LABURIC" in the last 8760 hours.  Objective:  VS:  HT:    WT:   BMI:     BP:   HR: bpm  TEMP: ( )  RESP:  Physical Exam Vitals and nursing note reviewed.  Constitutional:      General: She is not in acute distress.    Appearance: Normal appearance. She is well-developed. She is obese. She is not ill-appearing.  HENT:     Head: Normocephalic and atraumatic.  Eyes:     Conjunctiva/sclera: Conjunctivae normal.     Pupils: Pupils are equal, round, and reactive to light.  Cardiovascular:     Rate and Rhythm: Normal rate.     Pulses: Normal pulses.  Pulmonary:     Effort: Pulmonary effort is normal.  Musculoskeletal:        General: Tenderness present. No swelling or deformity.     Cervical back: Normal range of motion.  Right lower leg: No edema.     Left lower leg: No edema.     Comments: Inspection reveals no atrophy of the bilateral APB or FDI or hand intrinsics. There is no swelling, color changes, allodynia or dystrophic changes. There is 5 out of 5 strength in the bilateral wrist extension, finger abduction and long finger flexion. There is intact sensation to light touch in all dermatomal and peripheral nerve distributions. There is a negative Froment's test bilaterally. There is a negative Tinel's test at the bilateral wrist and elbow. There is a positive Phalen's test bilaterally. There is a negative Hoffmann's test bilaterally.  Skin:    General: Skin is warm and dry.     Findings: No erythema or rash.  Neurological:     General: No focal deficit present.     Mental Status: She is alert and oriented to person, place, and time.     Cranial Nerves: No cranial nerve deficit.     Sensory: No sensory deficit.     Motor: No weakness or abnormal  muscle tone.     Coordination: Coordination normal.     Gait: Gait normal.  Psychiatric:        Mood and Affect: Mood normal.        Behavior: Behavior normal.     Ortho Exam  Imaging: No results found.  Past Medical/Family/Surgical/Social History: Medications & Allergies reviewed per EMR, new medications updated. 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   Vitamin D deficiency 03/23/2020   Depression 02/11/2020   Type 2 diabetes mellitus with microalbuminuria, without long-term current use of insulin (HCC) 01/13/2020   Class 3 severe obesity with serious comorbidity and body mass index (BMI) of 45.0 to 49.9 in adult (HCC) 10/14/2019   Candidiasis of vagina 10/14/2019   Hemoglobin low 10/14/2019   Hypertension associated with type 2 diabetes mellitus (HCC) 09/01/2017   Uncontrolled type 2 diabetes mellitus with hyperglycemia (HCC) 09/01/2017   Abscess of neck 09/01/2017   Benign neoplasm of colon 08/15/2013   Internal hemorrhoids 08/15/2013   Rectal bleeding 07/02/2013   Hidradenitis suppurativa 10/06/2008   Keloid scar 08/04/2008   Past Medical History:  Diagnosis Date   Anemia    Arthritis    Back pain    Chest pain    COVID 08/2020   Runny nose, fever, HA symptoms resolved   Diabetes mellitus without complication (HCC) 2007   type 2   Fibroids 2010   Hidradenitis suppurativa    Hypertension    Joint pain    Low hemoglobin 10/31/2022   Low iron    PCOS (polycystic ovarian syndrome)    Prolapsed uterus 2010   Sleep apnea    cpap, pt does not know settings   Vitamin D deficiency    Wears glasses    Family History  Problem Relation Age of Onset   Hypertension Mother     Diabetes Mother    Obesity Mother    Hypertension Father    Diabetes Father    Sleep apnea Father    Obesity Father    Seizures Sister    Stroke Maternal Aunt    Diabetes Maternal Aunt    Hypertension Maternal Aunt    Diabetes Maternal Uncle  Hypertension Maternal Grandfather    Heart disease Maternal Grandfather    Diabetes Maternal Grandfather    Diabetes Paternal Grandmother    Heart disease Paternal Grandmother    Breast cancer Cousin    Colon cancer Neg Hx    Liver disease Neg Hx    Kidney disease Neg Hx    Ovarian cancer Neg Hx    Endometrial cancer Neg Hx    Pancreatic cancer Neg Hx    Prostate cancer Neg Hx    Past Surgical History:  Procedure Laterality Date   carbuckle cyst in back removed  02/2005   COLONOSCOPY N/A 08/15/2013   Procedure: COLONOSCOPY;  Surgeon: Louis Meckel, MD;  Location: [facility];  Service: Endoscopy;  Laterality: N/A;   CYST EXCISION N/A 03/24/2022   Procedure: EXCISION OF CHEST SEBACEAUS CYST;  Surgeon: Harriette Bouillon, MD;  Location: [facility];  Service: General;  Laterality: N/A;   DILATION AND CURETTAGE OF UTERUS  2011   Endometrial hyperplasia with atypia, submucosal fibroid   HYSTEROSCOPY  2011   HYSTEROSCOPY WITH D & C N/A 03/03/2021   Procedure: DILATATION AND CURETTAGE /HYSTEROSCOPY;  Surgeon: Tereso Newcomer, MD;  Location: [facility];  Service: Gynecology;  Laterality: N/A;   ROBOTIC ASSISTED LAPAROSCOPIC HYSTERECTOMY AND SALPINGECTOMY N/A 04/08/2021   Procedure: XI ROBOTIC ASSISTED LAPAROSCOPIC HYSTERECTOMY AND SALPINGECTOMY;  Surgeon: Carver Fila, MD;  Location: [facility];  Service: Gynecology;  Laterality: N/A;   ROBOTIC ASSISTED LAPAROSCOPIC SACROCOLPOPEXY N/A 04/08/2021   Procedure: XI ROBOTIC ASSISTED LAPAROSCOPIC SACROCOLPOPEXY;  Surgeon: Crist Fat, MD;  Location: [facility];  Service: Urology;  Laterality: N/A;   SENTINEL NODE BIOPSY N/A 04/08/2021   Procedure: SENTINEL NODE  BIOPSY;  Surgeon: Carver Fila, MD;  Location: [facility];  Service: Gynecology;  Laterality: N/A;   WISDOM TOOTH EXTRACTION  1998   Social History   Occupational History   Occupation: Physiological scientist  Tobacco Use   Smoking status: Never   Smokeless tobacco: Never  Vaping Use   Vaping status: Never Used  Substance and Sexual Activity   Alcohol use: Yes    Comment: occasional   Drug use: No   Sexual activity: Yes    Birth control/protection: None

## 2023-06-13 ENCOUNTER — Encounter: Payer: Self-pay | Admitting: Physical Medicine and Rehabilitation

## 2023-09-26 ENCOUNTER — Ambulatory Visit (INDEPENDENT_AMBULATORY_CARE_PROVIDER_SITE_OTHER)

## 2023-09-26 ENCOUNTER — Other Ambulatory Visit (HOSPITAL_COMMUNITY): Payer: Self-pay

## 2023-09-26 ENCOUNTER — Ambulatory Visit
Admission: EM | Admit: 2023-09-26 | Discharge: 2023-09-26 | Disposition: A | Discharge status: Home / Self Care | Attending: Family Medicine | Admitting: Family Medicine

## 2023-09-26 ENCOUNTER — Ambulatory Visit: Payer: Self-pay | Admitting: Urgent Care

## 2023-09-26 DIAGNOSIS — R109 Unspecified abdominal pain: Secondary | ICD-10-CM | POA: Insufficient documentation

## 2023-09-26 DIAGNOSIS — N200 Calculus of kidney: Secondary | ICD-10-CM | POA: Diagnosis not present

## 2023-09-26 LAB — POCT URINALYSIS DIP (MANUAL ENTRY)
Blood, UA: NEGATIVE
Glucose, UA: NEGATIVE mg/dL
Leukocytes, UA: NEGATIVE
Nitrite, UA: NEGATIVE
Protein Ur, POC: NEGATIVE mg/dL
Spec Grav, UA: 1.01 (ref 1.010–1.025)
Urobilinogen, UA: 1 U/dL
pH, UA: 5.5 (ref 5.0–8.0)

## 2023-09-26 MED ORDER — CYCLOBENZAPRINE HCL 5 MG PO TABS
5.0000 mg | ORAL_TABLET | Freq: Every evening | ORAL | 0 refills | Status: AC | PRN
  Filled 2023-09-26: qty 30, 30d supply, fill #0

## 2023-09-26 MED ORDER — IBUPROFEN 600 MG PO TABS
600.0000 mg | ORAL_TABLET | Freq: Four times a day (QID) | ORAL | 0 refills | Status: AC | PRN
Start: 2023-09-26 — End: ?
  Filled 2023-09-26: qty 30, 8d supply, fill #0

## 2023-09-26 MED ORDER — KETOROLAC TROMETHAMINE 30 MG/ML IJ SOLN
30.0000 mg | Freq: Once | INTRAMUSCULAR | Status: AC
  Administered 2023-09-26: 30 mg via INTRAMUSCULAR

## 2023-09-26 NOTE — ED Triage Notes (Signed)
 Pt c/o left flank/abd, lower back pain started x 2 days ago-also c/o odd smelling urine-NAD-steady gait

## 2023-09-26 NOTE — Discharge Instructions (Signed)
 I will update you with your x-ray result later today.  For now hydrate with 80 to 100 ounces of water  daily.  This can help flush out a kidney stone and/or help with a low back strain, left flank strain.  Use a muscle relaxant at bedtime.  Ibuprofen  for pain and inflammation.

## 2023-09-26 NOTE — ED Provider Notes (Signed)
 [facility]  Note:  This document was prepared using Conservation officer, historic buildings and may include unintentional dictation errors.  MRN: [id number] DOB: May 13, 1980  Subjective:   Cathy Ho is a 43 y.o. female presenting for 2-day history of persistent left sided lower back pain with left-sided flank pain that radiates anteriorly toward the groin area.  Has also had malodorous urine.  Admits that she does not hydrate as well.  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 Denies fever, n/v, abdominal pain, pelvic pain, rashes, dysuria, urinary frequency, hematuria, vaginal discharge.  No history of renal stones.  No bloody stools, constipation.  No current facility-administered medications for this encounter.  Current Outpatient Medications:    b complex vitamins capsule, Take 1 capsule by mouth daily., Disp: , Rfl:    Biotin 1 MG CAPS, Take by mouth., Disp: , Rfl:    estradiol  (ESTRACE  VAGINAL) 0.1 MG/GM vaginal cream, Place 1 Applicatorful ( a small bead) inside vagina every other day., Disp: 42.5 g, Rfl: 3   glucose blood (FREESTYLE LITE) test strip, Use to check glucose once per day, Disp: 150 each, Rfl: 98   lisinopril -hydrochlorothiazide  (ZESTORETIC ) 10-12.5 MG tablet, Take 1 tablet by mouth daily., Disp: 90 tablet, Rfl: 3   Multiple Vitamins-Minerals (MULTIVITAMIN ADULT, MINERALS, PO), Take 1 tablet by mouth daily., Disp: , Rfl:    tirzepatide  (MOUNJARO ) 15 MG/0.5ML Pen, Inject 15 mg into the skin once a week., Disp: 6 mL, Rfl: 1   No Known Allergies  Past Medical History:  Diagnosis Date   Anemia    Arthritis    Back pain    Chest pain    COVID 08/2020   Runny nose, fever, HA symptoms resolved   Diabetes mellitus without complication (HCC) 2007   type 2   Fibroids 2010   Hidradenitis suppurativa    Hypertension    Joint pain    Low hemoglobin 10/31/2022   Low iron    PCOS (polycystic ovarian syndrome)     Prolapsed uterus 2010   Sleep apnea    cpap, pt does not know settings   Vitamin D  deficiency    Wears glasses      Past Surgical History:  Procedure Laterality Date   carbuckle cyst in back removed  02/2005   COLONOSCOPY N/A 08/15/2013   Procedure: COLONOSCOPY;  Surgeon: Claudette Cue, MD;  Location: [facility];  Service: Endoscopy;  Laterality: N/A;   CYST EXCISION N/A 03/24/2022   Procedure: EXCISION OF CHEST SEBACEAUS CYST;  Surgeon: Sim Dryer, MD;  Location: [facility];  Service: General;  Laterality: N/A;   DILATION AND CURETTAGE OF UTERUS  2011   Endometrial hyperplasia with atypia, submucosal fibroid   HYSTEROSCOPY  2011   HYSTEROSCOPY WITH D & C N/A 03/03/2021   Procedure: DILATATION AND CURETTAGE /HYSTEROSCOPY;  Surgeon: Julianne Octave, MD;  Location: [facility];  Service: Gynecology;  Laterality: N/A;   ROBOTIC ASSISTED LAPAROSCOPIC HYSTERECTOMY AND SALPINGECTOMY N/A 04/08/2021   Procedure: XI ROBOTIC ASSISTED LAPAROSCOPIC HYSTERECTOMY AND SALPINGECTOMY;  Surgeon: Suzi Essex, MD;  Location: [facility];  Service: Gynecology;  Laterality: N/A;   ROBOTIC ASSISTED LAPAROSCOPIC SACROCOLPOPEXY N/A 04/08/2021   Procedure: XI ROBOTIC ASSISTED LAPAROSCOPIC SACROCOLPOPEXY;  Surgeon: Andrez Banker, MD;  Location: [facility];  Service: Urology;  Laterality: N/A;   SENTINEL NODE BIOPSY N/A 04/08/2021   Procedure: SENTINEL NODE BIOPSY;  Surgeon: Suzi Essex, MD;  Location: [facility];  Service: Gynecology;  Laterality: N/A;   WISDOM TOOTH EXTRACTION  1998    Family History  Problem Relation Age of Onset   Hypertension Mother    Diabetes Mother    Obesity Mother    Hypertension Father    Diabetes Father    Sleep apnea Father    Obesity Father    Seizures Sister    Stroke Maternal Aunt    Diabetes Maternal Aunt    Hypertension Maternal Aunt    Diabetes Maternal Uncle    Hypertension Maternal Grandfather    Heart disease  Maternal Grandfather    Diabetes Maternal Grandfather    Diabetes Paternal Grandmother    Heart disease Paternal Grandmother    Breast cancer Cousin    Colon cancer Neg Hx    Liver disease Neg Hx    Kidney disease Neg Hx    Ovarian cancer Neg Hx    Endometrial cancer Neg Hx    Pancreatic cancer Neg Hx    Prostate cancer Neg Hx     Social History   Tobacco Use   Smoking status: Never   Smokeless tobacco: Never  Vaping Use   Vaping status: Never Used  Substance Use Topics   Alcohol use: Yes    Comment: occasional   Drug use: No    ROS   Objective:   Vitals: BP 121/80 (BP Location: Left Arm)   Pulse 83   Temp 98.5 F (36.9 C) (Oral)   Resp 20   LMP 03/03/2021 (Approximate)   SpO2 96%   Physical Exam Constitutional:      General: She is not in acute distress.    Appearance: Normal appearance. She is well-developed. She is not ill-appearing, toxic-appearing or diaphoretic.  HENT:     Head: Normocephalic and atraumatic.     Nose: Nose normal.     Mouth/Throat:     Mouth: Mucous membranes are moist.  Eyes:     General: No scleral icterus.       Right eye: No discharge.        Left eye: No discharge.     Extraocular Movements: Extraocular movements intact.  Cardiovascular:     Rate and Rhythm: Normal rate.  Pulmonary:     Effort: Pulmonary effort is normal.  Musculoskeletal:     Lumbar back: Spasms and tenderness present. No swelling, edema, deformity, signs of trauma, lacerations or bony tenderness. Normal range of motion. Negative right straight leg raise test and negative left straight leg raise test. No scoliosis.       Back:  Skin:    General: Skin is warm and dry.  Neurological:     General: No focal deficit present.     Mental Status: She is alert and oriented to person, place, and time.     Motor: No weakness.     Coordination: Coordination normal.     Gait: Gait normal.     Deep Tendon Reflexes: Reflexes normal.  Psychiatric:        Mood and  Affect: Mood normal.        Behavior: Behavior normal.     Results for orders placed or performed during the hospital encounter of 09/26/23 (from the past 24 hours)  POCT urinalysis dipstick     Status: Abnormal   Collection Time: 09/26/23 12:51 PM  Result Value Ref Range   Color, UA yellow yellow   Clarity, UA clear clear   Glucose, UA negative negative mg/dL  Bilirubin, UA small (A) negative   Ketones, POC UA moderate (40) (A) negative mg/dL   Spec Grav, UA 2.956 2.130 - 1.025   Blood, UA negative negative   pH, UA 5.5 5.0 - 8.0   Protein Ur, POC negative negative mg/dL   Urobilinogen, UA 1.0 0.2 or 1.0 E.U./dL   Nitrite, UA Negative Negative   Leukocytes, UA Negative Negative   DG Abd 1 View Result Date: 09/26/2023 CLINICAL DATA:  Left kidney stone EXAM: ABDOMEN - 1 VIEW COMPARISON:  None Available. FINDINGS: 2 upright views of the abdomen and upper pelvis. Breast tissue projects over the upper abdomen on both views, limiting evaluation. Given this limitation, no calcific densities over the kidneys or expected course of the ureters. No evidence of appendicolith. Non-obstructive bowel gas pattern. No gross free intraperitoneal air. Low pelvis excluded. IMPRESSION: No evidence of urinary tract calculi, given above limitations. Electronically Signed   By: Lore Rode M.D.   On: 09/26/2023 15:21   IM Toradol  30 mg administered in clinic for pain.  No history of renal disease.  Assessment and Plan :   PDMP not reviewed this encounter.  1. Left flank pain    Lumbar strain versus renal colic.  Emphasized need to hydrate very consistently.  Use ibuprofen  for pain and inflammation, cyclobenzaprine as a muscle relaxant.  Counseled patient on potential for adverse effects with medications prescribed/recommended today, ER and return-to-clinic precautions discussed, patient verbalized understanding.    Adolph Hoop, [place] 09/26/23 [id number]

## 2023-09-27 LAB — URINE CULTURE: Culture: NO GROWTH

## 2023-10-02 DIAGNOSIS — H524 Presbyopia: Secondary | ICD-10-CM | POA: Diagnosis not present

## 2023-10-06 DIAGNOSIS — R946 Abnormal results of thyroid function studies: Secondary | ICD-10-CM | POA: Diagnosis not present

## 2023-10-06 DIAGNOSIS — E119 Type 2 diabetes mellitus without complications: Secondary | ICD-10-CM | POA: Diagnosis not present

## 2023-10-06 DIAGNOSIS — I10 Essential (primary) hypertension: Secondary | ICD-10-CM | POA: Diagnosis not present

## 2023-10-06 DIAGNOSIS — Z113 Encounter for screening for infections with a predominantly sexual mode of transmission: Secondary | ICD-10-CM | POA: Diagnosis not present

## 2023-10-06 DIAGNOSIS — E559 Vitamin D deficiency, unspecified: Secondary | ICD-10-CM | POA: Diagnosis not present

## 2023-10-06 DIAGNOSIS — Z13220 Encounter for screening for lipoid disorders: Secondary | ICD-10-CM | POA: Diagnosis not present

## 2023-10-17 ENCOUNTER — Other Ambulatory Visit (HOSPITAL_COMMUNITY): Payer: Self-pay

## 2023-10-17 DIAGNOSIS — E559 Vitamin D deficiency, unspecified: Secondary | ICD-10-CM | POA: Diagnosis not present

## 2023-10-17 DIAGNOSIS — Z0000 Encounter for general adult medical examination without abnormal findings: Secondary | ICD-10-CM | POA: Diagnosis not present

## 2023-10-17 DIAGNOSIS — E119 Type 2 diabetes mellitus without complications: Secondary | ICD-10-CM | POA: Diagnosis not present

## 2023-10-17 DIAGNOSIS — R7989 Other specified abnormal findings of blood chemistry: Secondary | ICD-10-CM | POA: Diagnosis not present

## 2023-10-17 MED ORDER — OZEMPIC (2 MG/DOSE) 8 MG/3ML ~~LOC~~ SOPN
2.0000 mg | PEN_INJECTOR | SUBCUTANEOUS | 5 refills | Status: AC
  Filled 2023-10-17: qty 3, 28d supply, fill #0
  Filled 2023-11-09: qty 3, 28d supply, fill #1
  Filled 2023-12-08: qty 3, 28d supply, fill #2
  Filled 2024-01-04: qty 3, 28d supply, fill #3
  Filled 2024-02-02: qty 3, 28d supply, fill #4
  Filled 2024-02-29: qty 3, 28d supply, fill #5

## 2023-10-17 MED ORDER — DOXYCYCLINE HYCLATE 100 MG PO CAPS
100.0000 mg | ORAL_CAPSULE | Freq: Two times a day (BID) | ORAL | 1 refills | Status: AC
  Filled 2023-10-17: qty 20, 10d supply, fill #0
  Filled 2023-11-09: qty 20, 10d supply, fill #1

## 2023-12-04 DIAGNOSIS — Z111 Encounter for screening for respiratory tuberculosis: Secondary | ICD-10-CM | POA: Diagnosis not present

## 2024-01-02 ENCOUNTER — Other Ambulatory Visit (HOSPITAL_COMMUNITY): Payer: Self-pay

## 2024-01-02 MED ORDER — FLUZONE 0.5 ML IM SUSY
0.5000 mL | PREFILLED_SYRINGE | Freq: Once | INTRAMUSCULAR | 0 refills | Status: AC
Start: 2024-01-02 — End: 2024-01-03
  Filled 2024-01-02: qty 0.5, 1d supply, fill #0

## 2024-02-21 ENCOUNTER — Encounter: Payer: Self-pay | Admitting: Gastroenterology

## 2024-02-26 ENCOUNTER — Encounter: Payer: Self-pay | Admitting: Radiology

## 2024-03-14 ENCOUNTER — Other Ambulatory Visit: Payer: Self-pay | Admitting: Family Medicine

## 2024-03-14 DIAGNOSIS — Z1231 Encounter for screening mammogram for malignant neoplasm of breast: Secondary | ICD-10-CM

## 2024-03-27 ENCOUNTER — Other Ambulatory Visit (HOSPITAL_COMMUNITY): Payer: Self-pay

## 2024-03-28 ENCOUNTER — Ambulatory Visit

## 2024-03-28 ENCOUNTER — Encounter: Payer: Self-pay | Admitting: Gastroenterology

## 2024-03-28 ENCOUNTER — Other Ambulatory Visit (HOSPITAL_COMMUNITY): Payer: Self-pay

## 2024-03-28 ENCOUNTER — Other Ambulatory Visit: Payer: Self-pay

## 2024-03-28 VITALS — Ht 64.0 in | Wt 260.0 lb

## 2024-03-28 DIAGNOSIS — Z1211 Encounter for screening for malignant neoplasm of colon: Secondary | ICD-10-CM

## 2024-03-28 MED ORDER — LISINOPRIL-HYDROCHLOROTHIAZIDE 10-12.5 MG PO TABS
1.0000 | ORAL_TABLET | Freq: Every day | ORAL | 2 refills | Status: DC
  Filled 2024-03-28: qty 90, 90d supply, fill #0

## 2024-03-28 MED ORDER — NA SULFATE-K SULFATE-MG SULF 17.5-3.13-1.6 GM/177ML PO SOLN
1.0000 | Freq: Once | ORAL | 0 refills | Status: AC
  Filled 2024-03-28: qty 354, 2d supply, fill #0

## 2024-03-28 NOTE — Progress Notes (Signed)
 Denies allergies to eggs or soy products. Denies complication of anesthesia or sedation. Denies use of weight loss medication. Denies use of O2.   Emmi instructions given for colonoscopy.

## 2024-04-11 ENCOUNTER — Other Ambulatory Visit: Payer: Self-pay | Admitting: Gastroenterology

## 2024-04-11 ENCOUNTER — Encounter: Payer: Self-pay | Admitting: Gastroenterology

## 2024-04-11 ENCOUNTER — Ambulatory Visit (AMBULATORY_SURGERY_CENTER): Admitting: Gastroenterology

## 2024-04-11 VITALS — BP 102/69 | HR 71 | Temp 97.4°F | Resp 15 | Ht 64.0 in | Wt 260.0 lb

## 2024-04-11 DIAGNOSIS — Z1211 Encounter for screening for malignant neoplasm of colon: Secondary | ICD-10-CM

## 2024-04-11 DIAGNOSIS — K648 Other hemorrhoids: Secondary | ICD-10-CM

## 2024-04-11 DIAGNOSIS — G4730 Sleep apnea, unspecified: Secondary | ICD-10-CM | POA: Diagnosis not present

## 2024-04-11 DIAGNOSIS — D128 Benign neoplasm of rectum: Secondary | ICD-10-CM

## 2024-04-11 DIAGNOSIS — K621 Rectal polyp: Secondary | ICD-10-CM

## 2024-04-11 DIAGNOSIS — I10 Essential (primary) hypertension: Secondary | ICD-10-CM | POA: Diagnosis not present

## 2024-04-11 DIAGNOSIS — E119 Type 2 diabetes mellitus without complications: Secondary | ICD-10-CM | POA: Diagnosis not present

## 2024-04-11 DIAGNOSIS — F419 Anxiety disorder, unspecified: Secondary | ICD-10-CM | POA: Diagnosis not present

## 2024-04-11 MED ORDER — SODIUM CHLORIDE 0.9 % IV SOLN: 500.0000 mL | INTRAVENOUS | Status: DC

## 2024-04-11 NOTE — Progress Notes (Signed)
 Called to room to assist during endoscopic procedure.  Patient ID and intended procedure confirmed with present staff. Received instructions for my participation in the procedure from the performing physician.

## 2024-04-11 NOTE — Op Note (Signed)
 Cathy Ho Patient Name: Cathy Ho Procedure Date: 04/11/2024 1:59 PM MRN: [id number] Endoscopist: Elspeth P. Leigh , MD, [id number] Age: 43 Referring MD:  Date of Birth: 1980/06/03 Gender: Female Account #: [id number] Procedure:                Colonoscopy Indications:              Screening for colorectal malignant neoplasm Medicines:                Monitored Anesthesia Care Procedure:                Pre-Anesthesia Assessment:                           - Prior to the procedure, a History and Physical                            was performed, and patient medications and                            allergies were reviewed. The patient's tolerance of                            previous anesthesia was also reviewed. The risks                            and benefits of the procedure and the sedation                            options and risks were discussed with the patient.                            All questions were answered, and informed consent                            was obtained. Prior Anticoagulants: The patient has                            taken no anticoagulant or antiplatelet agents. ASA                            Grade Assessment: III - A patient with severe                            systemic disease. After reviewing the risks and                            benefits, the patient was deemed in satisfactory                            condition to undergo the procedure.                           After obtaining informed consent, the colonoscope  was passed under direct vision. Throughout the                            procedure, the patient's blood pressure, pulse, and                            oxygen saturations were monitored continuously. The                            CF [id number] #[id number] was introduced through the anus                            and advanced to the the cecum, identified by                             appendiceal orifice and ileocecal valve. The                            colonoscopy was performed without difficulty. The                            patient tolerated the procedure well. The quality                            of the bowel preparation was good. The ileocecal                            valve, appendiceal orifice, and rectum were                            photographed. Scope In: 2:06:52 PM Scope Out: 2:19:29 PM Scope Withdrawal Time: 0 hours 9 minutes 22 seconds  Total Procedure Duration: 0 hours 12 minutes 37 seconds  Findings:                 The perianal and digital rectal examinations were                            normal.                           A 4 mm polyp was found in the rectum. The polyp was                            sessile. The polyp was removed with a cold snare.                            Resection and retrieval were complete.                           Internal hemorrhoids were found during                            retroflexion. The hemorrhoids were small.  The exam was otherwise without abnormality. Complications:            No immediate complications. Estimated blood loss:                            Minimal. Estimated Blood Loss:     Estimated blood loss was minimal. Impression:               - One 4 mm polyp in the rectum, removed with a cold                            snare. Resected and retrieved.                           - Internal hemorrhoids.                           - The examination was otherwise normal. Recommendation:           - Patient has a contact number available for                            emergencies. The signs and symptoms of potential                            delayed complications were discussed with the                            patient. Return to normal activities tomorrow.                            Written discharge instructions were provided to the                            patient.                            - Resume previous diet.                           - Continue present medications.                           - Await pathology results. Elspeth P. Ellora Varnum, MD 04/11/2024 2:23:03 PM This report has been signed electronically.

## 2024-04-11 NOTE — Progress Notes (Signed)
 Pt's states no medical or surgical changes since previsit or office visit.

## 2024-04-11 NOTE — Progress Notes (Signed)
 Transferred to PACU via stretcher.  Not responding to stimulation at this time.  VSS upon leaving procedure room.

## 2024-04-11 NOTE — Progress Notes (Signed)
 [facility] History and Physical   Primary Care Physician:  Gerome Brunet, DO   Reason for Procedure:   Colon cancer screening  Plan:    colonoscopy     HPI: Cathy Ho is a 43 y.o. female  here for colonoscopy screening. History of constipation.   Patient denies any new bowel symptoms at this time. No family history of colon cancer known. Otherwise feels well without any cardiopulmonary symptoms.   I have discussed risks / benefits of anesthesia and endoscopic procedure with Darryle Marko Fischer and they wish to proceed with the exams as outlined today.   The patient was provided an opportunity to ask questions and all were answered. The patient agreed with the plan.    Past Medical History:  Diagnosis Date   Anemia    Anxiety    Arthritis    Back pain    Chest pain    COVID 08/2020   Runny nose, fever, HA symptoms resolved   Diabetes mellitus without complication (HCC) 2007   type 2   Fibroids 2010   Hidradenitis suppurativa    Hypertension    Joint pain    Low hemoglobin 10/31/2022   Low iron    PCOS (polycystic ovarian syndrome)    Prolapsed uterus 2010   Sleep apnea    cpap, pt does not know settings   Vitamin D  deficiency    Wears glasses     Past Surgical History:  Procedure Laterality Date   carbuckle cyst in back removed  02/2005   COLONOSCOPY N/A 08/15/2013   Procedure: COLONOSCOPY;  Surgeon: Lamar JONETTA Aho, MD;  Location: [facility];  Service: Endoscopy;  Laterality: N/A;   CYST EXCISION N/A 03/24/2022   Procedure: EXCISION OF CHEST SEBACEAUS CYST;  Surgeon: Vanderbilt Ned, MD;  Location: [facility];  Service: General;  Laterality: N/A;   DILATION AND CURETTAGE OF UTERUS  2011   Endometrial hyperplasia with atypia, submucosal fibroid   HYSTEROSCOPY  2011   HYSTEROSCOPY WITH D & C N/A 03/03/2021   Procedure: DILATATION AND CURETTAGE /HYSTEROSCOPY;  Surgeon: Herchel Gloris LABOR, MD;  Location: [facility];  Service: Gynecology;  Laterality: N/A;   ROBOTIC ASSISTED LAPAROSCOPIC HYSTERECTOMY AND SALPINGECTOMY N/A 04/08/2021   Procedure: XI ROBOTIC ASSISTED LAPAROSCOPIC HYSTERECTOMY AND SALPINGECTOMY;  Surgeon: Viktoria Comer SAUNDERS, MD;  Location: [facility];  Service: Gynecology;  Laterality: N/A;   ROBOTIC ASSISTED LAPAROSCOPIC SACROCOLPOPEXY N/A 04/08/2021   Procedure: XI ROBOTIC ASSISTED LAPAROSCOPIC SACROCOLPOPEXY;  Surgeon: Cam Morene ORN, MD;  Location: [facility];  Service: Urology;  Laterality: N/A;   SENTINEL NODE BIOPSY N/A 04/08/2021   Procedure: SENTINEL NODE BIOPSY;  Surgeon: Viktoria Comer SAUNDERS, MD;  Location: [facility];  Service: Gynecology;  Laterality: N/A;   WISDOM TOOTH EXTRACTION  1998    Prior to Admission medications  Medication Sig Start Date End Date Taking? Authorizing Provider  b complex vitamins capsule Take 1 capsule by mouth daily.   Yes [provider]  Biotin 1 MG CAPS Take by mouth.   Yes [provider]  cyclobenzaprine  (FLEXERIL ) 5 MG tablet Take 1 tablet (5 mg total) by mouth at bedtime as needed. 09/26/23  Yes Christopher Savannah, PA-C  glucose blood (FREESTYLE LITE) test strip Use to check glucose once per day 08/23/21  Yes   lisinopril -hydrochlorothiazide  (ZESTORETIC ) 10-12.5 MG tablet Take 1 tablet by mouth daily. 03/28/24  Yes   Multiple Vitamins-Minerals (MULTIVITAMIN ADULT, MINERALS, PO) Take 1 tablet by mouth daily.  Yes [provider]  OVER THE COUNTER MEDICATION Black seed oil, one capsule daily.   Yes [provider]  doxycycline  (VIBRAMYCIN ) 100 MG capsule Take 1 capsule (100 mg total) by mouth 2 (two) times daily for 10 days Patient not taking: Reported on 03/28/2024 10/17/23     estradiol  (ESTRACE  VAGINAL) 0.1 MG/GM vaginal cream Place 1 Applicatorful ( a small bead) inside vagina every other day. Patient not taking: Reported on 03/28/2024 08/16/21     ibuprofen  (ADVIL ) 600 MG tablet Take 1 tablet (600 mg total) by  mouth every 6 (six) hours as needed. 09/26/23   Christopher Savannah, PA-C  Semaglutide , 2 MG/DOSE, (OZEMPIC , 2 MG/DOSE,) 8 MG/3ML SOPN Inject 2 mg into the skin once a week. 10/17/23       Current Outpatient Medications  Medication Sig Dispense Refill   b complex vitamins capsule Take 1 capsule by mouth daily.     Biotin 1 MG CAPS Take by mouth.     cyclobenzaprine  (FLEXERIL ) 5 MG tablet Take 1 tablet (5 mg total) by mouth at bedtime as needed. 30 tablet 0   glucose blood (FREESTYLE LITE) test strip Use to check glucose once per day 150 each 98   lisinopril -hydrochlorothiazide  (ZESTORETIC ) 10-12.5 MG tablet Take 1 tablet by mouth daily. 90 tablet 2   Multiple Vitamins-Minerals (MULTIVITAMIN ADULT, MINERALS, PO) Take 1 tablet by mouth daily.     OVER THE COUNTER MEDICATION Black seed oil, one capsule daily.     doxycycline  (VIBRAMYCIN ) 100 MG capsule Take 1 capsule (100 mg total) by mouth 2 (two) times daily for 10 days (Patient not taking: Reported on 03/28/2024) 20 capsule 1   estradiol  (ESTRACE  VAGINAL) 0.1 MG/GM vaginal cream Place 1 Applicatorful ( a small bead) inside vagina every other day. (Patient not taking: Reported on 03/28/2024) 42.5 g 3   ibuprofen  (ADVIL ) 600 MG tablet Take 1 tablet (600 mg total) by mouth every 6 (six) hours as needed. 30 tablet 0   Semaglutide , 2 MG/DOSE, (OZEMPIC , 2 MG/DOSE,) 8 MG/3ML SOPN Inject 2 mg into the skin once a week. 3 mL 5   Current Facility-Administered Medications  Medication Dose Route Frequency Provider Last Rate Last Admin   0.9 %  sodium chloride  infusion  500 mL Intravenous Continuous Raunak Antuna, Elspeth SQUIBB, MD        Allergies as of 04/11/2024   (No Known Allergies)    Family History  Problem Relation Age of Onset   Hypertension Mother    Diabetes Mother    Obesity Mother    Hypertension Father    Diabetes Father    Sleep apnea Father    Obesity Father    Seizures Sister    Stroke Maternal Aunt    Diabetes Maternal Aunt    Hypertension  Maternal Aunt    Diabetes Maternal Uncle    Hypertension Maternal Grandfather    Heart disease Maternal Grandfather    Diabetes Maternal Grandfather    Diabetes Paternal Grandmother    Heart disease Paternal Grandmother    Breast cancer Cousin    Colon cancer Neg Hx    Liver disease Neg Hx    Kidney disease Neg Hx    Ovarian cancer Neg Hx    Endometrial cancer Neg Hx    Pancreatic cancer Neg Hx    Prostate cancer Neg Hx    Esophageal cancer Neg Hx    Stomach cancer Neg Hx    Rectal cancer Neg Hx     Social History  Socioeconomic History   Marital status: Single    Spouse name: Not on file   Number of children: Not on file   Years of education: Not on file   Highest education level: Not on file  Occupational History   Occupation: Geographical Information Systems Officer and student  Tobacco Use   Smoking status: Never   Smokeless tobacco: Never  Vaping Use   Vaping status: Never Used  Substance and Sexual Activity   Alcohol use: Yes    Comment: occasional   Drug use: No   Sexual activity: Yes    Birth control/protection: None  Other Topics Concern   Not on file  Social History Narrative   Not on file   Social Drivers of Health   Tobacco Use: Low Risk (04/11/2024)   Patient History    Smoking Tobacco Use: Never    Smokeless Tobacco Use: Never    Passive Exposure: Not on file  Financial Resource Strain: Not on file  Food Insecurity: Not on file  Transportation Needs: Not on file  Physical Activity: Not on file  Stress: Not on file  Social Connections: Not on file  Intimate Partner Violence: Not on file  Depression (EYV7-0): Not on file  Alcohol Screen: Not on file  Housing: Not on file  Utilities: Not on file  Health Literacy: Not on file    Review of Systems: All other review of systems negative except as mentioned in the HPI.  Physical Exam: Vital signs BP 112/71   Pulse 87   Temp (!) 97.4 F (36.3 C)   Ht 5' 4 (1.626 m)   Wt 260 lb (117.9 kg)   LMP 03/03/2021    SpO2 97%   BMI 44.63 kg/m   General:   Alert,  Well-developed, pleasant and cooperative in NAD Lungs:  Clear throughout to auscultation.   Heart:  Regular rate and rhythm Abdomen:  Soft, nontender and nondistended.   Neuro/Psych:  Alert and cooperative. Normal mood and affect. A and O x 3  Marcey Naval, MD [facility]

## 2024-04-11 NOTE — Patient Instructions (Signed)
 YOU HAD AN ENDOSCOPIC PROCEDURE TODAY AT THE [facility]:   Refer to the procedure report that was given to you for any specific questions about what was found during the examination.  If the procedure report does not answer your questions, please call your gastroenterologist to clarify.  If you requested that your care partner not be given the details of your procedure findings, then the procedure report has been included in a sealed envelope for you to review at your convenience later.  YOU SHOULD EXPECT: Some feelings of bloating in the abdomen. Passage of more gas than usual.  Walking can help get rid of the air that was put into your GI tract during the procedure and reduce the bloating. If you had a lower endoscopy (such as a colonoscopy or flexible sigmoidoscopy) you may notice spotting of blood in your stool or on the toilet paper. If you underwent a bowel prep for your procedure, you may not have a normal bowel movement for a few days.  Please Note:  You might notice some irritation and congestion in your nose or some drainage.  This is from the oxygen used during your procedure.  There is no need for concern and it should clear up in a day or so.  SYMPTOMS TO REPORT IMMEDIATELY:  Following lower endoscopy (colonoscopy or flexible sigmoidoscopy):  Excessive amounts of blood in the stool  Significant tenderness or worsening of abdominal pains  Swelling of the abdomen that is new, acute  Fever of 100F or higher  Resume previous diet Continue present medications Await pathology results Handouts on polyps and hemorrhoids given   For urgent or emergent issues, a gastroenterologist can be reached at any hour by calling [phone]. Do not use MyChart messaging for urgent concerns.    DIET:  We do recommend a small meal at first, but then you may proceed to your regular diet.  Drink plenty of fluids but you should avoid alcoholic beverages for 24 hours.  ACTIVITY:  You  should plan to take it easy for the rest of today and you should NOT DRIVE or use heavy machinery until tomorrow (because of the sedation medicines used during the test).    FOLLOW UP: Our staff will call the number listed on your records the next business day following your procedure.  We will call around 7:15- 8:00 am to check on you and address any questions or concerns that you may have regarding the information given to you following your procedure. If we do not reach you, we will leave a message.     If any biopsies were taken you will be contacted by phone or by letter within the next 1-3 weeks.  Please call us  at [phone] if you have not heard about the biopsies in 3 weeks.    SIGNATURES/CONFIDENTIALITY: You and/or your care partner have signed paperwork which will be entered into your electronic medical record.  These signatures attest to the fact that that the information above on your After Visit Summary has been reviewed and is understood.  Full responsibility of the confidentiality of this discharge information lies with you and/or your care-partner.

## 2024-04-12 ENCOUNTER — Telehealth: Payer: Self-pay | Admitting: *Deleted

## 2024-04-12 ENCOUNTER — Inpatient Hospital Stay: Admission: RE | Admit: 2024-04-12 | Discharge: 2024-04-12 | Attending: Family Medicine | Admitting: Family Medicine

## 2024-04-12 DIAGNOSIS — Z1231 Encounter for screening mammogram for malignant neoplasm of breast: Secondary | ICD-10-CM | POA: Diagnosis not present

## 2024-04-12 NOTE — Telephone Encounter (Signed)
" °  Follow up Call-     04/11/2024    1:06 PM  Call back number  Post procedure Call Back phone  # [phone]  Permission to leave phone message Yes     Patient questions:  Do you have a fever, pain , or abdominal swelling? No. Pain Score  0 *  Have you tolerated food without any problems? Yes.    Have you been able to return to your normal activities? Yes.    Do you have any questions about your discharge instructions: Diet   No. Medications  No. Follow up visit  No.  Do you have questions or concerns about your Care? No.  Actions: * If pain score is 4 or above: No action needed, pain <4.   "

## 2024-04-16 ENCOUNTER — Ambulatory Visit: Payer: Self-pay | Admitting: Gastroenterology

## 2024-04-16 DIAGNOSIS — E559 Vitamin D deficiency, unspecified: Secondary | ICD-10-CM | POA: Diagnosis not present

## 2024-04-16 DIAGNOSIS — E119 Type 2 diabetes mellitus without complications: Secondary | ICD-10-CM | POA: Diagnosis not present

## 2024-04-16 DIAGNOSIS — R7989 Other specified abnormal findings of blood chemistry: Secondary | ICD-10-CM | POA: Diagnosis not present

## 2024-04-16 LAB — SURGICAL PATHOLOGY

## 2024-04-23 ENCOUNTER — Other Ambulatory Visit (HOSPITAL_COMMUNITY): Payer: Self-pay

## 2024-04-23 DIAGNOSIS — E119 Type 2 diabetes mellitus without complications: Secondary | ICD-10-CM | POA: Diagnosis not present

## 2024-04-23 DIAGNOSIS — I10 Essential (primary) hypertension: Secondary | ICD-10-CM | POA: Diagnosis not present

## 2024-04-23 DIAGNOSIS — E559 Vitamin D deficiency, unspecified: Secondary | ICD-10-CM | POA: Diagnosis not present

## 2024-04-23 DIAGNOSIS — Z20828 Contact with and (suspected) exposure to other viral communicable diseases: Secondary | ICD-10-CM | POA: Diagnosis not present

## 2024-04-23 MED ORDER — DAPAGLIFLOZIN PRO-METFORMIN ER 5-1000 MG PO TB24
1.0000 | ORAL_TABLET | Freq: Every day | ORAL | 3 refills | Status: AC
  Filled 2024-04-23: qty 90, 90d supply, fill #0
  Filled 2024-07-17: qty 90, 90d supply, fill #1

## 2024-04-23 MED ORDER — OSELTAMIVIR PHOSPHATE 75 MG PO CAPS
75.0000 mg | ORAL_CAPSULE | Freq: Two times a day (BID) | ORAL | 0 refills | Status: AC
  Filled 2024-04-23: qty 10, 5d supply, fill #0

## 2024-04-23 MED ORDER — LOSARTAN POTASSIUM 25 MG PO TABS
25.0000 mg | ORAL_TABLET | Freq: Every day | ORAL | 3 refills | Status: AC
  Filled 2024-04-23: qty 90, 90d supply, fill #0

## 2024-06-04 ENCOUNTER — Other Ambulatory Visit (HOSPITAL_COMMUNITY): Payer: Self-pay

## 2024-06-04 MED ORDER — OZEMPIC (2 MG/DOSE) 8 MG/3ML ~~LOC~~ SOPN
2.0000 mg | PEN_INJECTOR | SUBCUTANEOUS | 5 refills | Status: AC
  Filled 2024-06-04: qty 3, 28d supply, fill #0
  Filled 2024-07-17: qty 3, 28d supply, fill #1

## 2024-06-14 ENCOUNTER — Other Ambulatory Visit: Payer: Self-pay

## 2024-07-17 ENCOUNTER — Other Ambulatory Visit: Payer: Self-pay

## 2024-07-23 ENCOUNTER — Other Ambulatory Visit: Payer: Self-pay
